# Patient Record
Sex: Male | Born: 1955 | Race: White | Hispanic: No | Marital: Married | Smoking: Former smoker
Health system: Southern US, Community
[De-identification: ages and names within clinical notes are randomized; demographics above are authoritative.]

## PROBLEM LIST (undated history)

## (undated) DIAGNOSIS — Z87442 Personal history of urinary calculi: Secondary | ICD-10-CM

## (undated) DIAGNOSIS — K219 Gastro-esophageal reflux disease without esophagitis: Secondary | ICD-10-CM

## (undated) DIAGNOSIS — E785 Hyperlipidemia, unspecified: Secondary | ICD-10-CM

## (undated) DIAGNOSIS — I10 Essential (primary) hypertension: Secondary | ICD-10-CM

## (undated) DIAGNOSIS — T7840XA Allergy, unspecified, initial encounter: Secondary | ICD-10-CM

## (undated) DIAGNOSIS — M1990 Unspecified osteoarthritis, unspecified site: Secondary | ICD-10-CM

## (undated) HISTORY — DX: Unspecified osteoarthritis, unspecified site: M19.90

## (undated) HISTORY — PX: FOOT SURGERY: SHX648

## (undated) HISTORY — DX: Essential (primary) hypertension: I10

## (undated) HISTORY — DX: Hyperlipidemia, unspecified: E78.5

## (undated) HISTORY — DX: Allergy, unspecified, initial encounter: T78.40XA

## (undated) HISTORY — PX: TOOTH EXTRACTION: SUR596

## (undated) HISTORY — DX: Gastro-esophageal reflux disease without esophagitis: K21.9

## (undated) HISTORY — DX: Personal history of urinary calculi: Z87.442

## (undated) HISTORY — PX: KIDNEY STONE SURGERY: SHX686

---

## 2006-08-20 ENCOUNTER — Emergency Department (HOSPITAL_COMMUNITY): Admission: EM | Admit: 2006-08-20 | Discharge: 2006-08-20 | Payer: Self-pay | Admitting: Emergency Medicine

## 2006-08-29 ENCOUNTER — Ambulatory Visit (HOSPITAL_BASED_OUTPATIENT_CLINIC_OR_DEPARTMENT_OTHER): Admission: RE | Admit: 2006-08-29 | Discharge: 2006-08-29 | Payer: Self-pay | Admitting: Urology

## 2008-10-11 ENCOUNTER — Ambulatory Visit: Payer: Self-pay | Admitting: Internal Medicine

## 2008-10-11 DIAGNOSIS — E785 Hyperlipidemia, unspecified: Secondary | ICD-10-CM | POA: Insufficient documentation

## 2008-10-11 DIAGNOSIS — I4949 Other premature depolarization: Secondary | ICD-10-CM

## 2008-10-11 DIAGNOSIS — Z87442 Personal history of urinary calculi: Secondary | ICD-10-CM | POA: Insufficient documentation

## 2008-10-11 DIAGNOSIS — M2550 Pain in unspecified joint: Secondary | ICD-10-CM | POA: Insufficient documentation

## 2008-10-11 DIAGNOSIS — J309 Allergic rhinitis, unspecified: Secondary | ICD-10-CM | POA: Insufficient documentation

## 2008-10-11 DIAGNOSIS — N401 Enlarged prostate with lower urinary tract symptoms: Secondary | ICD-10-CM

## 2008-10-11 DIAGNOSIS — I10 Essential (primary) hypertension: Secondary | ICD-10-CM

## 2008-10-11 DIAGNOSIS — K219 Gastro-esophageal reflux disease without esophagitis: Secondary | ICD-10-CM

## 2008-10-11 DIAGNOSIS — N138 Other obstructive and reflux uropathy: Secondary | ICD-10-CM

## 2008-10-11 LAB — CONVERTED CEMR LAB
AST: 23 units/L (ref 0–37)
Albumin: 4.4 g/dL (ref 3.5–5.2)
Alkaline Phosphatase: 71 units/L (ref 39–117)
Basophils Absolute: 0.1 10*3/uL (ref 0.0–0.1)
Basophils Relative: 0.7 % (ref 0.0–3.0)
Chloride: 101 meq/L (ref 96–112)
Crystals: NEGATIVE
GFR calc Af Amer: 115 mL/min
GFR calc non Af Amer: 95 mL/min
HDL: 66.4 mg/dL (ref >39.0)
Ketones, ur: NEGATIVE mg/dL
Leukocytes, UA: NEGATIVE
Lymphocytes Relative: 34.6 % (ref 12.0–46.0)
MCHC: 35.1 g/dL (ref 30.0–36.0)
Neutrophils Relative %: 57 % (ref 43.0–77.0)
Nitrite: NEGATIVE
PSA: 0.88 ng/mL (ref 0.10–4.00)
Platelets: 235 10*3/uL (ref 150–400)
Potassium: 3.6 meq/L (ref 3.5–5.1)
RBC: 4.97 M/uL (ref 4.22–5.81)
Sed Rate: 10 mm/hr (ref 0–16)
Sodium: 139 meq/L (ref 135–145)
Specific Gravity, Urine: 1.025 (ref 1.000–1.03)
TSH: 1.24 microintl units/mL (ref 0.35–5.50)
Total Bilirubin: 0.6 mg/dL (ref 0.3–1.2)
Triglycerides: 195 mg/dL — ABNORMAL HIGH (ref 0–149)
Urobilinogen, UA: 0.2 (ref 0.0–1.0)
VLDL: 39 mg/dL (ref 0–40)
pH: 6 (ref 5.0–8.0)

## 2008-10-12 ENCOUNTER — Encounter: Payer: Self-pay | Admitting: Internal Medicine

## 2008-10-25 ENCOUNTER — Ambulatory Visit: Payer: Self-pay | Admitting: Internal Medicine

## 2008-10-25 DIAGNOSIS — D2330 Other benign neoplasm of skin of unspecified part of face: Secondary | ICD-10-CM

## 2008-10-25 LAB — CONVERTED CEMR LAB
Cholesterol, target level: 200 mg/dL
LDL Goal: 160 mg/dL

## 2009-02-07 ENCOUNTER — Ambulatory Visit: Payer: Self-pay | Admitting: Internal Medicine

## 2009-02-08 ENCOUNTER — Encounter: Payer: Self-pay | Admitting: Internal Medicine

## 2009-02-25 ENCOUNTER — Ambulatory Visit: Payer: Self-pay | Admitting: Internal Medicine

## 2009-06-23 ENCOUNTER — Ambulatory Visit: Payer: Self-pay | Admitting: Internal Medicine

## 2009-06-23 DIAGNOSIS — R6882 Decreased libido: Secondary | ICD-10-CM

## 2009-06-23 LAB — CONVERTED CEMR LAB
BUN: 15 mg/dL (ref 6–23)
Basophils Absolute: 0 10*3/uL (ref 0.0–0.1)
Bilirubin, Direct: 0.1 mg/dL (ref 0.0–0.3)
Chloride: 107 meq/L (ref 96–112)
Creatinine, Ser: 0.9 mg/dL (ref 0.4–1.5)
Eosinophils Absolute: 0.1 10*3/uL (ref 0.0–0.7)
Eosinophils Relative: 1.5 % (ref 0.0–5.0)
GFR calc non Af Amer: 94.36 mL/min (ref >60)
Glucose, Bld: 99 mg/dL (ref 70–99)
HCT: 44.2 % (ref 39.0–52.0)
Hemoglobin, Urine: NEGATIVE
Leukocytes, UA: NEGATIVE
Lymphs Abs: 2.2 10*3/uL (ref 0.7–4.0)
MCV: 93 fL (ref 78.0–100.0)
Monocytes Absolute: 0.5 10*3/uL (ref 0.1–1.0)
Neutrophils Relative %: 56.8 % (ref 43.0–77.0)
Nitrite: NEGATIVE
PSA: 0.4 ng/mL (ref 0.10–4.00)
Platelets: 214 10*3/uL (ref 150.0–400.0)
Potassium: 4.2 meq/L (ref 3.5–5.1)
RDW: 13 % (ref 11.5–14.6)
Specific Gravity, Urine: 1.01 (ref 1.000–1.030)
TSH: 0.43 microintl units/mL (ref 0.35–5.50)
Testosterone: 507.62 ng/dL (ref 350.00–890.00)
Total Bilirubin: 0.7 mg/dL (ref 0.3–1.2)
Urine Glucose: NEGATIVE mg/dL
Urobilinogen, UA: 0.2 (ref 0.0–1.0)
WBC: 6.6 10*3/uL (ref 4.5–10.5)

## 2009-06-24 ENCOUNTER — Encounter: Payer: Self-pay | Admitting: Internal Medicine

## 2009-07-25 ENCOUNTER — Ambulatory Visit: Payer: Self-pay | Admitting: Internal Medicine

## 2009-11-23 ENCOUNTER — Ambulatory Visit: Payer: Self-pay | Admitting: Internal Medicine

## 2009-11-23 LAB — CONVERTED CEMR LAB
Cholesterol: 218 mg/dL — ABNORMAL HIGH (ref 0–200)
Total CHOL/HDL Ratio: 3
Triglycerides: 102 mg/dL (ref 0.0–149.0)

## 2009-11-25 ENCOUNTER — Encounter (INDEPENDENT_AMBULATORY_CARE_PROVIDER_SITE_OTHER): Payer: Self-pay | Admitting: *Deleted

## 2009-12-09 ENCOUNTER — Encounter (INDEPENDENT_AMBULATORY_CARE_PROVIDER_SITE_OTHER): Payer: Self-pay | Admitting: *Deleted

## 2009-12-12 ENCOUNTER — Ambulatory Visit: Payer: Self-pay | Admitting: Internal Medicine

## 2010-01-04 ENCOUNTER — Ambulatory Visit: Payer: Self-pay | Admitting: Internal Medicine

## 2010-01-04 LAB — HM COLONOSCOPY

## 2010-03-06 ENCOUNTER — Ambulatory Visit: Payer: Self-pay | Admitting: Internal Medicine

## 2010-04-06 ENCOUNTER — Telehealth: Payer: Self-pay | Admitting: Internal Medicine

## 2010-05-05 ENCOUNTER — Ambulatory Visit: Payer: Self-pay | Admitting: Internal Medicine

## 2010-05-05 DIAGNOSIS — K5900 Constipation, unspecified: Secondary | ICD-10-CM | POA: Insufficient documentation

## 2010-05-05 LAB — CONVERTED CEMR LAB
ALT: 28 units/L (ref 0–53)
AST: 20 units/L (ref 0–37)
Albumin: 4.3 g/dL (ref 3.5–5.2)
Basophils Absolute: 0.1 10*3/uL (ref 0.0–0.1)
Bilirubin Urine: NEGATIVE
Chloride: 93 meq/L — ABNORMAL LOW (ref 96–112)
Eosinophils Relative: 0.3 % (ref 0.0–5.0)
GFR calc non Af Amer: 76.2 mL/min (ref >60)
Glucose, Bld: 141 mg/dL — ABNORMAL HIGH (ref 70–99)
HCT: 42.8 % (ref 39.0–52.0)
Hemoglobin, Urine: NEGATIVE
Hemoglobin: 14.9 g/dL (ref 13.0–17.0)
Ketones, ur: NEGATIVE mg/dL
Lymphs Abs: 1.8 10*3/uL (ref 0.7–4.0)
MCV: 91.6 fL (ref 78.0–100.0)
Monocytes Absolute: 1.2 10*3/uL — ABNORMAL HIGH (ref 0.1–1.0)
Monocytes Relative: 7.1 % (ref 3.0–12.0)
Neutro Abs: 13.3 10*3/uL — ABNORMAL HIGH (ref 1.4–7.7)
PSA: 0.22 ng/mL (ref 0.10–4.00)
Potassium: 4.3 meq/L (ref 3.5–5.1)
RDW: 13.7 % (ref 11.5–14.6)
Sed Rate: 25 mm/hr — ABNORMAL HIGH (ref 0–22)
Sodium: 135 meq/L (ref 135–145)
TSH: 0.41 microintl units/mL (ref 0.35–5.50)
Total Protein, Urine: NEGATIVE mg/dL
Urine Glucose: NEGATIVE mg/dL

## 2010-05-07 ENCOUNTER — Emergency Department (HOSPITAL_COMMUNITY): Admission: EM | Admit: 2010-05-07 | Discharge: 2010-05-07 | Payer: Self-pay | Admitting: Emergency Medicine

## 2010-05-19 ENCOUNTER — Ambulatory Visit: Payer: Self-pay | Admitting: Internal Medicine

## 2010-10-31 NOTE — Miscellaneous (Signed)
Summary: [facility] Previsit/prep  Clinical Lists Changes  Medications: Added new medication of MOVIPREP 100 GM  SOLR (PEG-KCL-NACL-NASULF-NA ASC-C) As per prep instructions. - Signed Rx of MOVIPREP 100 GM  SOLR (PEG-KCL-NACL-NASULF-NA ASC-C) As per prep instructions.;  #1 x 0;  Signed;  Entered by: Wyona Almas RN;  Authorized by: Iva Boop MD, [facility];  Method used: Electronically to [facility] [address] [place], [place], [place]  [address], Ph: [phone], Fax: [phone] Observations: Added new observation of NKA: T (12/12/2009 13:01)    Prescriptions: MOVIPREP 100 GM  SOLR (PEG-KCL-NACL-NASULF-NA ASC-C) As per prep instructions.  #1 x 0   Entered by:   Wyona Almas RN   Authorized by:   Iva Boop MD, [facility]   Signed by:   Wyona Almas RN on 12/12/2009   Method used:   Electronically to        [facility] (retail)       [address]       [place], [place]  [address]       Ph: [phone]       Fax: [phone]   RxID:   [id number]

## 2010-10-31 NOTE — Assessment & Plan Note (Signed)
Summary: FU Steven Hickman  #   Vital Signs:  Patient profile:   55 year old male Height:      67 inches Weight:      197.50 pounds BMI:     31.04 O2 Sat:      96 % on Room air Temp:     98.2 degrees F oral Pulse rate:   82 / minute Pulse rhythm:   regular Resp:     16 per minute BP sitting:   132 / 68  (left arm) Cuff size:   large  Vitals Entered By: Rock Nephew CMA (May 19, 2010 9:45 AM)  Nutrition Counseling: Patient's BMI is greater than 25 and therefore counseled on weight management options.  O2 Flow:  Room air  Primary Care Provider:  Etta Grandchild MD   History of Present Illness: He returns for f/up and says that his stomach feels better now that he is having BM's 2-3 times per day.  Preventive Screening-Counseling & Management  Alcohol-Tobacco     Alcohol drinks/day: 0     Alcohol type: beer     Smoking Status: quit < 6 months     Smoking Cessation Counseling: yes     Year Quit: 05/2009     Pack years: 15     Passive Smoke Exposure: yes     Tobacco Counseling: to remain off tobacco products  Hep-HIV-STD-Contraception     Hepatitis Risk: no risk noted     HIV Risk: no risk noted     STD Risk: no risk noted      Sexual History:  currently monogamous.        Drug Use:  never.        Blood Transfusions:  no.    Medications Prior to Update: 1)  Finasteride 5 Mg Tabs (Finasteride) .... One Once Daily 2)  Omeprazole 40 Mg Cpdr (Omeprazole) .... One By Mouth Once Daily 3)  Amlodipine Besy-Benazepril Hcl 10-20 Mg Caps (Amlodipine Besy-Benazepril Hcl) .... One By Mouth Once Daily 4)  Cardura 2 Mg Tabs (Doxazosin Mesylate) .... One By Mouth Once Daily 5)  Folic Acid 800 Mcg 6)  Fish Oil 1200mg  7)  Garlic 1000 Mg 8)  Vitamin E 400 Iu 9)  Asa 81mg  10)  Complete Vitamin 11)  Nasonex 50 Mcg/act Susp (Mometasone Furoate) .... 2 Puffs Each Nostril Once Daily 12)  Triamcinolone Acetonide 0.5 % Crea (Triamcinolone Acetonide) .... Apply To Aa On Right Hand Two  Times A Day For 10 Days 13)  Miralax  Powd (Polyethylene Glycol 3350) .... One Scoop in 8 Ounces of Juice Once Daily As Needed For Constipation  Current Medications (verified): 1)  Finasteride 5 Mg Tabs (Finasteride) .... One Once Daily 2)  Omeprazole 40 Mg Cpdr (Omeprazole) .... One By Mouth Once Daily 3)  Amlodipine Besy-Benazepril Hcl 10-20 Mg Caps (Amlodipine Besy-Benazepril Hcl) .... One By Mouth Once Daily 4)  Cardura 2 Mg Tabs (Doxazosin Mesylate) .... One By Mouth Once Daily 5)  Folic Acid 800 Mcg 6)  Fish Oil 1200mg  7)  Garlic 1000 Mg 8)  Vitamin E 400 Iu 9)  Asa 81mg  10)  Complete Vitamin 11)  Triamcinolone Acetonide 0.5 % Crea (Triamcinolone Acetonide) .... Apply To Aa On Right Hand Two Times A Day For 10 Days 12)  Miralax  Powd (Polyethylene Glycol 3350) .... One Scoop in 8 Ounces of Juice Once Daily As Needed For Constipation  Allergies (verified): No Known Drug Allergies  Past History:  Past Medical History:  Last updated: 10/11/2008 Allergic rhinitis GERD Hyperlipidemia Hypertension Nephrolithiasis, hx of  Past Surgical History: Last updated: 10/11/2008 Denies surgical history  Family History: Last updated: 10/11/2008 Family History of Arthritis Family History Diabetes 1st degree relative Family History High cholesterol Family History Hypertension  Social History: Last updated: 02/25/2009 Occupation: Barista. Married Alcohol use-yes Drug use-no Regular exercise-no Smokes cigars  Risk Factors: Alcohol Use: 0 (05/19/2010) Exercise: no (10/11/2008)  Risk Factors: Smoking Status: quit < 6 months (05/19/2010) Passive Smoke Exposure: yes (05/19/2010)  Family History: Reviewed history from 10/11/2008 and no changes required. Family History of Arthritis Family History Diabetes 1st degree relative Family History High cholesterol Family History Hypertension  Social History: Reviewed history from 02/25/2009 and no changes  required. Occupation: Barista. Married Alcohol use-yes Drug use-no Regular exercise-no Smokes cigars  Review of Systems       The patient complains of weight gain.  The patient denies anorexia, fever, weight loss, chest pain, peripheral edema, prolonged cough, headaches, hemoptysis, abdominal pain, melena, hematochezia, severe indigestion/heartburn, hematuria, difficulty walking, depression, enlarged lymph nodes, and angioedema.   General:  Denies chills, fatigue, fever, loss of appetite, malaise, sleep disorder, sweats, weakness, and weight loss. GI:  Denies abdominal pain, bloody stools, change in bowel habits, constipation, diarrhea, excessive appetite, gas, indigestion, loss of appetite, nausea, vomiting, vomiting blood, and yellowish skin color. GU:  Denies discharge, dysuria, hematuria, nocturia, urinary frequency, and urinary hesitancy.  Physical Exam  General:  alert, well-developed, well-nourished, well-hydrated, and appropriate dress.   Head:  normocephalic, atraumatic, no abnormalities observed, and no abnormalities palpated.   Mouth:  Oral mucosa and oropharynx without lesions or exudates.  Teeth in good repair. Neck:  supple, full ROM, and no masses.   Lungs:  Normal respiratory effort, chest expands symmetrically. Lungs are clear to auscultation, no crackles or wheezes. Heart:  Normal rate and regular rhythm. S1 and S2 normal without gallop, murmur, click, rub or other extra sounds. Abdomen:  soft, non-tender, normal bowel sounds, no distention, no masses, no guarding, no rigidity, no rebound tenderness, no abdominal hernia, no inguinal hernia, no hepatomegaly, and no splenomegaly.   Msk:  normal ROM, no joint tenderness, no joint swelling, and no joint warmth.   Pulses:  R and L carotid,radial,femoral,dorsalis pedis and posterior tibial pulses are full and equal bilaterally Extremities:  trace left pedal edema and trace right pedal edema.   Neurologic:  No cranial nerve  deficits noted. Station and gait are normal. Plantar reflexes are down-going bilaterally. DTRs are symmetrical throughout. Sensory, motor and coordinative functions appear intact. Skin:  palmar surface of right hand has diffuse, confluent erythema with very sharp edges. there are no blisters or vesicles and there is no breakdown of the skin. distally there is good sensatoin and capillary refill. there is no streaking, induration, exudates. Cervical Nodes:  no anterior cervical adenopathy and no posterior cervical adenopathy.   Axillary Nodes:  no R axillary adenopathy and no L axillary adenopathy.   Psych:  Cognition and judgment appear intact. Alert and cooperative with normal attention span and concentration. No apparent delusions, illusions, hallucinations   Impression & Recommendations:  Problem # 1:  CONSTIPATION (ICD-564.00) Assessment Improved  His updated medication list for this problem includes:    Miralax Powd (Polyethylene glycol 3350) ..... One scoop in 8 ounces of juice once daily as needed for constipation  Problem # 2:  HYPERTENSION (ICD-401.9) Assessment: Improved  His updated medication list for this problem includes:  Amlodipine Besy-benazepril Hcl 10-20 Mg Caps (Amlodipine besy-benazepril hcl) ..... One by mouth once daily    Cardura 2 Mg Tabs (Doxazosin mesylate) ..... One by mouth once daily  BP today: 132/68 Prior BP: 152/80 (05/05/2010)  Prior 10 Yr Risk Heart Disease: Not enough information (02/07/2009)  Labs Reviewed: K+: 4.3 (05/05/2010) Creat: : 1.1 (05/05/2010)   Chol: 218 (11/23/2009)   HDL: 72.30 (11/23/2009)   LDL: DEL (10/11/2008)   TG: 102.0 (11/23/2009)  Problem # 3:  BENIGN PROSTATIC HYPERTROPHY, 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 One once daily    Cardura 2 Mg Tabs (Doxazosin mesylate) ..... One by mouth once  daily  PSA: 0.22 (05/05/2010)     Complete Medication List: 1)  Finasteride 5 Mg Tabs (Finasteride) .... One once daily 2)  Omeprazole 40 Mg Cpdr (Omeprazole) .... One by mouth once daily 3)  Amlodipine Besy-benazepril Hcl 10-20 Mg Caps (Amlodipine besy-benazepril hcl) .... One by mouth once daily 4)  Cardura 2 Mg Tabs (Doxazosin mesylate) .... One by mouth once daily 5)  Folic Acid 800 Mcg  6)  Fish Oil 1200mg   7)  Garlic 1000 Mg  8)  Vitamin E 400 Iu  9)  Asa 81mg   10)  Complete Vitamin  11)  Triamcinolone Acetonide 0.5 % Crea (Triamcinolone acetonide) .... Apply to aa on right hand two times a day for 10 days 12)  Miralax Powd (Polyethylene glycol 3350) .... One scoop in 8 ounces of juice once daily as needed for constipation  Patient Instructions: 1)  Please schedule a follow-up appointment in 2 months. 2)  It is important that you exercise regularly at least 20 minutes 5 times a week. If you develop chest pain, have severe difficulty breathing, or feel very tired , stop exercising immediately and seek medical attention. 3)  You need to lose weight. Consider a lower calorie diet and regular exercise.  4)  Check your Blood Pressure regularly. If it is above 130/80: you should make an appointment.

## 2010-10-31 NOTE — Progress Notes (Signed)
Summary: Refill--Cardura  Phone Note Refill Request Message from:  Fax from Pharmacy on April 06, 2010 8:37 AM  Refills Requested: Medication #1:  CARDURA 2 MG TABS one by mouth once daily Initial call taken by: Lucious Groves,  April 06, 2010 8:37 AM    Prescriptions: CARDURA 2 MG TABS (DOXAZOSIN MESYLATE) one by mouth once daily  #90 x 3   Entered by:   Lucious Groves   Authorized by:   Etta Grandchild MD   Signed by:   Lucious Groves on 04/06/2010   Method used:   Faxed to ...       [facility]       [address]       [place], [place]  [address]       Ph: [phone]       Fax: [phone]   RxID:   [id number]

## 2010-10-31 NOTE — Assessment & Plan Note (Signed)
Summary: LOWER BACK PAIN/STOMACH ACHE-LB   Vital Signs:  Patient profile:   55 year old male Height:      67 inches Weight:      196.25 pounds BMI:     30.85 O2 Sat:      98 % on Room air Temp:     98.6 degrees F oral Pulse rate:   89 / minute Pulse rhythm:   regular Resp:     16 per minute BP sitting:   152 / 80  (left arm) Cuff size:   large  Vitals Entered By: Rock Nephew CMA (May 05, 2010 2:41 PM)  Nutrition Counseling: Patient's BMI is greater than 25 and therefore counseled on weight management options.  O2 Flow:  Room air CC: Patient c/o back, abdominal pain x 05/03/10 Is Patient Diabetic? No Pain Assessment Patient in pain? yes     Location: back, stomach Intensity: 5 Type: aching Onset of pain  Constant   Primary Care [place] Jolin:  Etta Grandchild MD  CC:  Patient c/o back and abdominal pain x 05/03/10.  History of Present Illness:  Abdominal Pain      This is a 55 year old man who presents with Abdominal pain.  The symptoms began 3 days ago.  On a scale of 1 to 10, the intensity is described as a 4.  The patient reports constipation, but denies nausea, vomiting, diarrhea, melena, hematochezia, anorexia, and hematemesis.  The location of the pain is diffuse.  The pain is described as intermittent, dull, and radiating to the back.  The patient denies the following symptoms: fever, weight loss, dysuria, chest pain, jaundice, and dark urine.  The pain is worse with food.    Preventive Screening-Counseling & Management  Alcohol-Tobacco     Alcohol drinks/day: 0     Alcohol type: beer     Smoking Status: quit < 6 months     Smoking Cessation Counseling: yes     Year Quit: 05/2009     Pack years: 15     Passive Smoke Exposure: yes     Tobacco Counseling: to remain off tobacco products  Hep-HIV-STD-Contraception     Hepatitis Risk: no risk noted     HIV Risk: no risk noted     STD Risk: no risk noted      Sexual History:  currently monogamous.     Drug Use:  never.        Blood Transfusions:  no.    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 One Once Daily 2)  Omeprazole 40 Mg Cpdr (Omeprazole) .... One By Mouth Once Daily 3)  Amlodipine Besy-Benazepril Hcl 10-20 Mg Caps (Amlodipine Besy-Benazepril Hcl) .... One By Mouth Once Daily 4)  Cardura 2 Mg Tabs (Doxazosin Mesylate) .... One By Mouth Once Daily 5)  Folic Acid 800  Mcg 6)  Fish Oil 1200mg  7)  Garlic 1000 Mg 8)  Vitamin E 400 Iu 9)  Asa 81mg  10)  Complete Vitamin 11)  Nasonex 50 Mcg/act Susp (Mometasone Furoate) .... 2 Puffs Each Nostril Once Daily 12)  Triamcinolone Acetonide 0.5 % Crea (Triamcinolone Acetonide) .... Apply To Aa On Right Hand Two Times A Day For 10 Days  Current Medications (verified): 1)  Finasteride 5 Mg Tabs (Finasteride) .... One Once Daily 2)  Omeprazole 40 Mg Cpdr (Omeprazole) .... One By Mouth Once Daily 3)  Amlodipine Besy-Benazepril Hcl 10-20 Mg Caps (Amlodipine Besy-Benazepril Hcl) .... One By Mouth Once Daily 4)  Cardura 2 Mg Tabs (Doxazosin Mesylate) .... One By Mouth Once Daily 5)  Folic Acid 800 Mcg 6)  Fish Oil 1200mg  7)  Garlic 1000 Mg 8)  Vitamin E 400 Iu 9)  Asa 81mg  10)  Complete Vitamin 11)  Nasonex 50 Mcg/act Susp (Mometasone  Furoate) .... 2 Puffs Each Nostril Once Daily 12)  Triamcinolone Acetonide 0.5 % Crea (Triamcinolone Acetonide) .... Apply To Aa On Right Hand Two Times A Day For 10 Days  Allergies (verified): No Known Drug Allergies  Past History:  Past Medical History: Last updated: 10/11/2008 Allergic rhinitis GERD Hyperlipidemia Hypertension Nephrolithiasis, hx of  Past Surgical History: Last updated: 10/11/2008 Denies surgical history  Family History: Last updated: 10/11/2008 Family History of Arthritis Family History Diabetes 1st degree relative Family History High cholesterol Family History Hypertension  Social History: Last updated: 02/25/2009 Occupation: Barista. Married Alcohol use-yes Drug use-no Regular exercise-no Smokes cigars  Risk Factors: Alcohol Use: 0 (05/05/2010) Exercise: no (10/11/2008)  Risk Factors: Smoking Status: quit < 6 months (05/05/2010) Passive Smoke Exposure: yes (05/05/2010)  Family History: Reviewed history from 10/11/2008 and no changes required. Family History of Arthritis Family History Diabetes 1st degree relative Family History High cholesterol Family History Hypertension  Social History: Reviewed history from 02/25/2009 and no changes required. Occupation: Barista. Married Alcohol use-yes Drug use-no Regular exercise-no Smokes cigars  Review of Systems       The patient complains of abdominal pain.  The patient denies anorexia, fever, chest pain, syncope, dyspnea on exertion, peripheral edema, prolonged cough, headaches, hemoptysis, melena, hematochezia, severe indigestion/heartburn, hematuria, suspicious skin lesions, difficulty walking, depression, abnormal bleeding, enlarged lymph nodes, angioedema, and testicular masses.   General:  Denies chills, fatigue, fever, loss of appetite, malaise, sleep disorder, sweats, weakness, and weight loss. GI:  Complains of abdominal pain and constipation; denies bloody stools,  diarrhea, gas, hemorrhoids, indigestion, loss of appetite, nausea, vomiting, vomiting blood, and yellowish skin color. GU:  Denies decreased libido, discharge, dysuria, erectile dysfunction, genital sores, hematuria, incontinence, nocturia, urinary frequency, and urinary hesitancy.  Physical Exam  General:  alert, well-developed, well-nourished, well-hydrated, and appropriate dress.   Head:  normocephalic, atraumatic, no abnormalities observed, and no abnormalities palpated.   Eyes:  vision grossly intact, pupils equal, pupils round, and pupils reactive to light.  no icterus. Ears:  R ear normal and L ear normal.   Mouth:  Oral mucosa and oropharynx without lesions or exudates.  Teeth in good repair. Neck:  supple, full ROM, and no masses.   Lungs:  Normal respiratory effort, chest expands symmetrically. Lungs are clear to auscultation, no crackles or wheezes. Heart:  Normal rate and regular rhythm. S1 and S2 normal without gallop, murmur, click, rub or other extra sounds. Abdomen:  soft, non-tender, no distention, no masses, no guarding, no rigidity, no rebound tenderness, no abdominal hernia,  no inguinal hernia, no hepatomegaly, no splenomegaly, and bowel sounds hypoactive.   Rectal:  No external abnormalities noted. Normal sphincter tone. No rectal masses or tenderness. Genitalia:  uncircumcised, no hydrocele, no varicocele, no scrotal masses, no testicular masses or atrophy, no cutaneous lesions, and no urethral discharge.   Prostate:  no nodules, no asymmetry, no induration, and 1+ enlarged.   Msk:  normal ROM, no joint tenderness, no joint swelling, and no joint warmth.   Pulses:  R and L carotid,radial,femoral,dorsalis pedis and posterior tibial pulses are full and equal bilaterally Extremities:  No clubbing, cyanosis, edema, or deformity noted with normal full range of motion of all joints.   Neurologic:  No cranial nerve deficits noted. Station and gait are normal. Plantar reflexes are  down-going bilaterally. DTRs are symmetrical throughout. Sensory, motor and coordinative functions appear intact. Skin:  palmar surface of right hand has diffuse, confluent erythema with very sharp edges. there are no blisters or vesicles and there is no breakdown of the skin. distally there is good sensatoin and capillary refill. there is no streaking, induration, exudates. Cervical Nodes:  no anterior cervical adenopathy and no posterior cervical adenopathy.   Psych:  Cognition and judgment appear intact. Alert and cooperative with normal attention span and concentration. No apparent delusions, illusions, hallucinations   Impression & Recommendations:  Problem # 1:  ABDOMINAL PAIN, EPIGASTRIC (ICD-789.06) Assessment New plain films show a moderate stool burden which I think is causing his pain, will look at labs for other organic causes as well Orders: Venipuncture ([address]) TLB-BMP (Basic Metabolic Panel-BMET) (80048-METABOL) TLB-CBC Platelet - w/Differential (85025-CBCD) TLB-Hepatic/Liver Function Pnl (80076-HEPATIC) TLB-TSH (Thyroid Stimulating Hormone) (84443-TSH) TLB-Amylase (82150-AMYL) TLB-Lipase (83690-LIPASE) TLB-Udip w/ Micro (81001-URINE) TLB-Sedimentation Rate (ESR) (85652-ESR) TLB-PSA (Prostate Specific Antigen) (84153-PSA) T-Abdomen 2-view (74020TC) Hemoccult Guaiac-1 spec.(in office) ([address])  Problem # 2:  CONSTIPATION (ICD-564.00) Assessment: New  His updated medication list for this problem includes:    Miralax Powd (Polyethylene glycol 3350) ..... One scoop in 8 ounces of juice once daily as needed for constipation  Discussed dietary fiber measures and increased water intake.   Orders: Hemoccult Guaiac-1 spec.(in office) ([address])  Problem # 3:  HYPERTENSION (ICD-401.9) Assessment: Improved  His updated medication list for this problem includes:    Amlodipine Besy-benazepril Hcl 10-20 Mg Caps (Amlodipine besy-benazepril hcl) ..... One by mouth once daily     Cardura 2 Mg Tabs (Doxazosin mesylate) ..... One by mouth once daily  Orders: Venipuncture ([address]) TLB-BMP (Basic Metabolic Panel-BMET) (80048-METABOL) TLB-CBC Platelet - w/Differential (85025-CBCD) TLB-Hepatic/Liver Function Pnl (80076-HEPATIC) TLB-TSH (Thyroid Stimulating Hormone) (84443-TSH) TLB-Amylase (82150-AMYL) TLB-Lipase (83690-LIPASE) TLB-Udip w/ Micro (81001-URINE) TLB-Sedimentation Rate (ESR) (85652-ESR) TLB-PSA (Prostate Specific Antigen) (84153-PSA) Hemoccult Guaiac-1 spec.(in office) ([address])  BP today: 152/80 Prior BP: 140/72 (03/06/2010)  Prior 10 Yr Risk Heart Disease: Not enough information (02/07/2009)  Labs Reviewed: K+: 4.2 (06/23/2009) Creat: : 0.9 (06/23/2009)   Chol: 218 (11/23/2009)   HDL: 72.30 (11/23/2009)   LDL: DEL (10/11/2008)   TG: 102.0 (11/23/2009)  Problem # 4:  BENIGN PROSTATIC HYPERTROPHY, 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 One once daily    Cardura 2 Mg Tabs (Doxazosin mesylate) ..... One by mouth once daily  Orders: Venipuncture ([address]) TLB-BMP (Basic Metabolic Panel-BMET) (80048-METABOL) TLB-CBC Platelet - w/Differential (85025-CBCD) TLB-Hepatic/Liver Function Pnl (80076-HEPATIC) TLB-TSH (Thyroid Stimulating Hormone) (84443-TSH) TLB-Amylase (82150-AMYL) TLB-Lipase (83690-LIPASE) TLB-Udip w/ Micro (81001-URINE) TLB-Sedimentation Rate (ESR) (85652-ESR) TLB-PSA (Prostate  Specific Antigen) (84153-PSA) Hemoccult Guaiac-1 spec.(in office) (82270)  Complete Medication List: 1)  Finasteride 5 Mg Tabs (Finasteride) .... One once daily 2)  Omeprazole 40 Mg Cpdr (Omeprazole) .... One by mouth once daily 3)  Amlodipine Besy-benazepril Hcl 10-20 Mg Caps (Amlodipine besy-benazepril hcl) .... One by mouth once daily 4)  Cardura 2 Mg Tabs (Doxazosin mesylate) .... One by mouth once daily 5)  Folic Acid 800 Mcg  6)  Fish  Oil 1200mg   7)  Garlic 1000 Mg  8)  Vitamin E 400 Iu  9)  Asa 81mg   10)  Complete Vitamin  11)  Nasonex 50 Mcg/act Susp (Mometasone furoate) .... 2 puffs each nostril once daily 12)  Triamcinolone Acetonide 0.5 % Crea (Triamcinolone acetonide) .... Apply to aa on right hand two times a day for 10 days 13)  Miralax Powd (Polyethylene glycol 3350) .... One scoop in 8 ounces of juice once daily as needed for constipation  Colorectal Screening:  Current Recommendations:    Hemoccult: NEG X 1 today  PSA Screening:    PSA: 0.40  (06/23/2009)    Reviewed PSA screening recommendations: PSA ordered  Immunization & Chemoprophylaxis:    Tetanus vaccine: Tdap  (11/23/2009)    Influenza vaccine: Historical  (06/15/2009)  Patient Instructions: 1)  Please schedule a follow-up appointment in 2 weeks. Prescriptions: MIRALAX  POWD (POLYETHYLENE GLYCOL 3350) One scoop in 8 ounces of juice once daily as needed for constipation  #30 doses x 11   Entered and Authorized by:   Etta Grandchild MD   Signed by:   Etta Grandchild MD on 05/05/2010   Method used:   Electronically to        [facility] (retail)       [address]       [place], [place]  [address]       Ph: [phone]       Fax: [phone]   RxID:   [id number]

## 2010-10-31 NOTE — Assessment & Plan Note (Signed)
Summary: CPX-LB   Vital Signs:  Patient profile:   55 year old male Height:      67 inches Weight:      193 pounds BMI:     30.34 O2 Sat:      98 % on Room air Temp:     97.7 degrees F oral Pulse rate:   74 / minute Pulse rhythm:   regular Resp:     16 per minute BP sitting:   124 / 72  (left arm) Cuff size:   large  Vitals Entered By: Rock Nephew CMA (November 23, 2009 10:33 AM)  Nutrition Counseling: Patient's BMI is greater than 25 and therefore counseled on weight management options.  O2 Flow:  Room air  Primary Care Provider:  Etta Grandchild MD   History of Present Illness: He returns for a complete physical but also c/o nasal congestion.  Hypertension History:      He denies headache, chest pain, palpitations, dyspnea with exertion, orthopnea, PND, peripheral edema, visual symptoms, neurologic problems, syncope, and side effects from treatment.  He notes no problems with any antihypertensive medication side effects.        Positive major cardiovascular risk factors include male age 53 years old or older, hyperlipidemia, and hypertension.  Negative major cardiovascular risk factors include no history of diabetes, negative family history for ischemic heart disease, and non-tobacco-user status.        Further assessment for target organ damage reveals no history of ASHD, cardiac end-organ damage (CHF/LVH), stroke/TIA, peripheral vascular disease, renal insufficiency, or hypertensive retinopathy.     Preventive Screening-Counseling & Management  Alcohol-Tobacco     Alcohol drinks/day: 0     Alcohol type: beer     Smoking Status: quit < 6 months     Smoking Cessation Counseling: yes     Year Quit: 05/2009     Pack years: 15     Passive Smoke Exposure: yes  Hep-HIV-STD-Contraception     Hepatitis Risk: no risk noted     HIV Risk: no risk noted     STD Risk: no risk noted      Sexual History:  currently monogamous.        Drug Use:  never.        Blood  Transfusions:  no.    Clinical Review Panels:  Prevention   Last PSA:  0.40 (06/23/2009)  Immunizations   Last Tetanus Booster:  Tdap (11/23/2009)   Last Flu Vaccine:  Historical (06/15/2009)  Lipid Management   Cholesterol:  226 (10/11/2008)   LDL (bad choesterol):  DEL (10/11/2008)   HDL (good cholesterol):  66.4 (10/11/2008)  Diabetes Management   HgBA1C:  5.6 (10/11/2008)   Creatinine:  0.9 (06/23/2009)   Last Flu Vaccine:  Historical (06/15/2009)  CBC   WBC:  6.6 (06/23/2009)   RBC:  4.75 (06/23/2009)   Hgb:  15.0 (06/23/2009)   Hct:  44.2 (06/23/2009)   Platelets:  214.0 (06/23/2009)   MCV  93.0 (06/23/2009)   MCHC  34.0 (06/23/2009)   RDW  13.0 (06/23/2009)   PMN:  56.8 (06/23/2009)   Lymphs:  33.3 (06/23/2009)   Monos:  7.9 (06/23/2009)   Eosinophils:  1.5 (06/23/2009)   Basophil:  0.5 (06/23/2009)  Complete Metabolic Panel   Glucose:  99 (06/23/2009)   Sodium:  141 (06/23/2009)   Potassium:  4.2 (06/23/2009)   Chloride:  107 (06/23/2009)   CO2:  27 (06/23/2009)   BUN:  15 (06/23/2009)   Creatinine:  0.9 (06/23/2009)   Albumin:  4.2 (06/23/2009)   Total Protein:  7.8 (06/23/2009)   Calcium:  9.3 (06/23/2009)   Total Bili:  0.7 (06/23/2009)   Alk Phos:  84 (06/23/2009)   SGPT (ALT):  29 (06/23/2009)   SGOT (AST):  21 (06/23/2009)   Current Medications (verified): 1)  Finasteride 5 Mg Tabs (Finasteride) .... One Once Daily 2)  Omeprazole 40 Mg Cpdr (Omeprazole) .... One By Mouth Once Daily 3)  Amlodipine Besy-Benazepril Hcl 10-20 Mg Caps (Amlodipine Besy-Benazepril Hcl) .... One By Mouth Once Daily 4)  Cardura 2 Mg Tabs (Doxazosin Mesylate) .... One By Mouth Once Daily 5)  Folic Acid 800 Mcg 6)  Fish Oil 1200mg  7)  Garlic 1000 Mg 8)  Vitamin E 400 Iu 9)  Asa 81mg  10)  Complete Vitamin  Allergies (verified): No Known Drug Allergies  Past History:  Past Medical History: Reviewed history from 10/11/2008 and no changes required. Allergic  rhinitis GERD Hyperlipidemia Hypertension Nephrolithiasis, hx of  Past Surgical History: Reviewed history from 10/11/2008 and no changes required. Denies surgical history  Family History: Reviewed history from 10/11/2008 and no changes required. Family History of Arthritis Family History Diabetes 1st degree relative Family History High cholesterol Family History Hypertension  Social History: Reviewed history from 02/25/2009 and no changes required. Occupation: Barista. Married Alcohol use-yes Drug use-no Regular exercise-no Smokes cigars Hepatitis Risk:  no risk noted HIV Risk:  no risk noted STD Risk:  no risk noted  Review of Systems ENT:  Complains of nasal congestion and postnasal drainage; denies decreased hearing, difficulty swallowing, ear discharge, earache, hoarseness, nosebleeds, ringing in ears, sinus pressure, and sore throat. GU:  Complains of urinary frequency; denies discharge, dysuria, hematuria, incontinence, nocturia, and urinary hesitancy.  Physical Exam  General:  alert, well-developed, well-nourished, well-hydrated, and appropriate dress.   Head:  normocephalic, atraumatic, no abnormalities observed, and no abnormalities palpated.   Eyes:  vision grossly intact, pupils equal, pupils round, and pupils reactive to light.   Ears:  R ear normal and L ear normal.   Nose:  no external deformity, no airflow obstruction, no intranasal foreign body, no nasal polyps, no nasal mucosal lesions, no mucosal friability, no active bleeding or clots, no sinus percussion tenderness, no septum abnormalities, nasal dischargemucosal pallor, and mucosal edema.   Mouth:  Oral mucosa and oropharynx without lesions or exudates.  Teeth in good repair. Neck:  supple, full ROM, and no masses.   Lungs:  Normal respiratory effort, chest expands symmetrically. Lungs are clear to auscultation, no crackles or wheezes. Heart:  Normal rate and regular rhythm. S1 and S2 normal  without gallop, murmur, click, rub or other extra sounds. Abdomen:  soft, non-tender, normal bowel sounds, no distention, and no masses.   Rectal:  No external abnormalities noted. Normal sphincter tone. No rectal masses or tenderness. heme negative stool. Genitalia:  circumcised, no hydrocele, no varicocele, no scrotal masses, no testicular masses or atrophy, no cutaneous lesions, and no urethral discharge.   Prostate:  no nodules, no asymmetry, no induration, and 1+ enlarged.   Msk:  normal ROM, no joint tenderness, no joint swelling, and no joint warmth.   Pulses:  R and L carotid,radial,femoral,dorsalis pedis and posterior tibial pulses are full and equal bilaterally Extremities:  No clubbing, cyanosis, edema, or deformity noted with normal full range of motion of all joints.   Neurologic:  No cranial nerve deficits noted. Station and gait are normal. Plantar reflexes are down-going bilaterally. DTRs are  symmetrical throughout. Sensory, motor and coordinative functions appear intact. Skin:  turgor normal, color normal, and no rashes.   Cervical Nodes:  no anterior cervical adenopathy and no posterior cervical adenopathy.   Axillary Nodes:  no R axillary adenopathy and no L axillary adenopathy.   Inguinal Nodes:  no R inguinal adenopathy and no L inguinal adenopathy.   Psych:  Cognition and judgment appear intact. Alert and cooperative with normal attention span and concentration. No apparent delusions, illusions, hallucinations Additional Exam:  EKG is unchanged from prior EKG's   Impression & Recommendations:  Problem # 1:  ALLERGIC RHINITIS (ICD-477.9) Assessment Deteriorated  His updated medication list for this problem includes:    Nasonex 50 Mcg/act Susp (Mometasone furoate) .Marland Kitchen... 2 puffs each nostril once daily  Problem # 2:  ROUTINE GENERAL MEDICAL EXAM@HEALTH  CARE FACL (ICD-V70.0) I discussed with the patient the need and technique for monthly testicular self-exam and skin  exams.  I reiterated the need for healthy,safe living with respect to monogamous and protected sex, limited use of alcohol, avoiding drug abuse, no risk taking, and safe driving/seat belt usage.  Orders: Venipuncture (60454) TLB-Lipid Panel (80061-LIPID) Gastroenterology Referral (GI) Hemoccult Guaiac-1 spec.(in office) (82270)  Problem # 3:  ELECTROCARDIOGRAM, ABNORMAL (ICD-794.31) Assessment: Unchanged  Orders: EKG w/ Interpretation (93000)  Problem # 4:  HYPERTENSION (ICD-401.9) Assessment: Improved  His updated medication list for this problem includes:    Amlodipine Besy-benazepril Hcl 10-20 Mg Caps (Amlodipine besy-benazepril hcl) ..... One by mouth once daily    Cardura 2 Mg Tabs (Doxazosin mesylate) ..... One by mouth once daily  BP today: 124/72 Prior BP: 132/74 (07/25/2009)  Prior 10 Yr Risk Heart Disease: Not enough information (02/07/2009)  Labs Reviewed: K+: 4.2 (06/23/2009) Creat: : 0.9 (06/23/2009)   Chol: 226 (10/11/2008)   HDL: 66.4 (10/11/2008)   LDL: DEL (10/11/2008)   TG: 195 (10/11/2008)  Complete Medication List: 1)  Finasteride 5 Mg Tabs (Finasteride) .... One once daily 2)  Omeprazole 40 Mg Cpdr (Omeprazole) .... One by mouth once daily 3)  Amlodipine Besy-benazepril Hcl 10-20 Mg Caps (Amlodipine besy-benazepril hcl) .... One by mouth once daily 4)  Cardura 2 Mg Tabs (Doxazosin mesylate) .... One by mouth once daily 5)  Folic Acid 800 Mcg  6)  Fish Oil 1200mg   7)  Garlic 1000 Mg  8)  Vitamin E 400 Iu  9)  Asa 81mg   10)  Complete Vitamin  11)  Nasonex 50 Mcg/act Susp (Mometasone furoate) .... 2 puffs each nostril once daily  Other Orders: Tdap => 100yrs IM (09811) Admin 1st Vaccine (91478)  Hypertension Assessment/Plan:      The patient's hypertensive risk group is category B: At least one risk factor (excluding diabetes) with no target organ damage.  Today's blood pressure is 124/72.  His blood pressure goal is < 140/90.  Patient  Instructions: 1)  It is important that you exercise regularly at least 20 minutes 5 times a week. If you develop chest pain, have severe difficulty breathing, or feel very tired , stop exercising immediately and seek medical attention. 2)  You need to lose weight. Consider a lower calorie diet and regular exercise.  3)  Check your Blood Pressure regularly. If it is above 140/90: you should make an appointment. Prescriptions: NASONEX 50 MCG/ACT SUSP (MOMETASONE FUROATE) 2 puffs each nostril once daily  #4 inhs x 0   Entered and Authorized by:   Etta Grandchild MD   Signed by:   Etta Grandchild  MD on 11/23/2009   Method used:   Samples Given   RxID:   [id number]    Immunizations Administered:  Tetanus Vaccine:    Vaccine Type: Tdap    Site: left deltoid    Mfr: GlaxoSmithKline    Dose: 0.5 ml    Route: IM    Given by: Rock Nephew CMA    Exp. Date: 11/26/2011    Lot #: [id number]    VIS given: 08/19/07 version given November 23, 2009.

## 2010-10-31 NOTE — Letter (Signed)
Summary: [facility] Instructions  [facility]  [address] [place], [place] [address]   Phone: [phone]  Fax: [phone]       Steven Hickman    May 04, 1956    MRN: [id number]        Procedure Day Dorna Bloom: Wednesday 01/04/10     Arrival Time: 9:00 a.m.     Procedure Time: 10:00 a.m.     Location of Procedure:                    _x_  [facility] (4th Floor)                        PREPARATION FOR COLONOSCOPY WITH MOVIPREP   Starting 5 days prior to your procedure 12/30/09 do not eat nuts, seeds, popcorn, corn, beans, peas,  salads, or any raw vegetables.  Do not take any fiber supplements (e.g. Metamucil, Citrucel, and Benefiber).  THE DAY BEFORE YOUR PROCEDURE         DATE: 01/03/10  DAY: Tuesday  1.  Drink clear liquids the entire day-NO SOLID FOOD  2.  Do not drink anything colored red or purple.  Avoid juices with pulp.  No orange juice.  3.  Drink at least 64 oz. (8 glasses) of fluid/clear liquids during the day to prevent dehydration and help the prep work efficiently.  CLEAR LIQUIDS INCLUDE: Water Jello Ice Popsicles Tea (sugar ok, no milk/cream) Powdered fruit flavored drinks Coffee (sugar ok, no milk/cream) Gatorade Juice: apple, white grape, white cranberry  Lemonade Clear bullion, consomm, broth Carbonated beverages (any kind) Strained chicken noodle soup Hard Candy                             4.  In the morning, mix first dose of MoviPrep solution:    Empty 1 Pouch A and 1 Pouch B into the disposable container    Add lukewarm drinking water to the top line of the container. Mix to dissolve    Refrigerate (mixed solution should be used within 24 hrs)  5.  Begin drinking the prep at 5:00 p.m. The MoviPrep container is divided by 4 marks.   Every 15 minutes drink the solution down to the next mark (approximately 8 oz) until the full liter is complete.   6.  Follow completed prep with 16 oz of clear liquid of your choice (Nothing  red or purple).  Continue to drink clear liquids until bedtime.  7.  Before going to bed, mix second dose of MoviPrep solution:    Empty 1 Pouch A and 1 Pouch B into the disposable container    Add lukewarm drinking water to the top line of the container. Mix to dissolve    Refrigerate  THE DAY OF YOUR PROCEDURE      DATE: 01/04/10  DAY: Wednesday  Beginning at 5:00 a.m. (5 hours before procedure):         1. Every 15 minutes, drink the solution down to the next mark (approx 8 oz) until the full liter is complete.  2. Follow completed prep with 16 oz. of clear liquid of your choice.    3. You may drink clear liquids until 8:00 a.m. (2 HOURS BEFORE PROCEDURE).   MEDICATION INSTRUCTIONS  Unless otherwise instructed, you should take regular prescription medications with a small sip of water   as early as possible the morning of  your procedure.          OTHER INSTRUCTIONS  You will need a responsible adult at least 53 years of age to accompany you and drive you home.   This person must remain in the waiting room during your procedure.  Wear loose fitting clothing that is easily removed.  Leave jewelry and other valuables at home.  However, you may wish to bring a book to read or  an iPod/MP3 player to listen to music as you wait for your procedure to start.  Remove all body piercing jewelry and leave at home.  Total time from sign-in until discharge is approximately 2-3 hours.  You should go home directly after your procedure and rest.  You can resume normal activities the  day after your procedure.  The day of your procedure you should not:   Drive   Make legal decisions   Operate machinery   Drink alcohol   Return to work  You will receive specific instructions about eating, activities and medications before you leave.    The above instructions have been reviewed and explained to me by   _______________________    I fully understand and can verbalize  these instructions _____________________________ Date _________

## 2010-10-31 NOTE — Assessment & Plan Note (Signed)
Summary: BURNING AND BLISTERING ON RIGHT HAND-LB   Vital Signs:  Patient profile:   55 year old male Height:      67 inches Weight:      196 pounds BMI:     30.81 O2 Sat:      95 % on Room air Temp:     97.3 degrees F oral Pulse rate:   89 / minute Pulse rhythm:   regular Resp:     16 per minute BP sitting:   140 / 72  (left arm) Cuff size:   large  Vitals Entered By: Rock Nephew CMA (March 06, 2010 3:08 PM)  Nutrition Counseling: Patient's BMI is greater than 25 and therefore counseled on weight management options.  O2 Flow:  Room air Is Patient Diabetic? No Pain Assessment Patient in pain? no        Primary Care Provider:  Etta Grandchild MD   History of Present Illness: He returns c/o burning and redness on the palm of his right hand for one day after he was at work and thinks a Web designer got underneath the glove. The symptoms peaked last night and are better today.   Preventive Screening-Counseling & Management  Alcohol-Tobacco     Alcohol drinks/day: 0     Alcohol type: beer     Smoking Status: quit < 6 months     Smoking Cessation Counseling: yes     Year Quit: 05/2009     Pack years: 15     Passive Smoke Exposure: yes  Hep-HIV-STD-Contraception     Hepatitis Risk: no risk noted     HIV Risk: no risk noted     STD Risk: no risk noted      Sexual History:  currently monogamous.        Drug Use:  never.        Blood Transfusions:  no.    Medications Prior to Update: 1)  Finasteride 5 Mg Tabs (Finasteride) .... One Once Daily 2)  Omeprazole 40 Mg Cpdr (Omeprazole) .... One By Mouth Once Daily 3)  Amlodipine Besy-Benazepril Hcl 10-20 Mg Caps (Amlodipine Besy-Benazepril Hcl) .... One By Mouth Once Daily 4)  Cardura 2 Mg Tabs (Doxazosin Mesylate) .... One By Mouth Once Daily 5)  Folic Acid 800 Mcg 6)  Fish Oil 1200mg  7)  Garlic 1000 Mg 8)  Vitamin E 400 Iu 9)  Asa 81mg  10)  Complete Vitamin 11)  Nasonex 50 Mcg/act Susp (Mometasone Furoate) .... 2  Puffs Each Nostril Once Daily  Current Medications (verified): 1)  Finasteride 5 Mg Tabs (Finasteride) .... One Once Daily 2)  Omeprazole 40 Mg Cpdr (Omeprazole) .... One By Mouth Once Daily 3)  Amlodipine Besy-Benazepril Hcl 10-20 Mg Caps (Amlodipine Besy-Benazepril Hcl) .... One By Mouth Once Daily 4)  Cardura 2 Mg Tabs (Doxazosin Mesylate) .... One By Mouth Once Daily 5)  Folic Acid 800 Mcg 6)  Fish Oil 1200mg  7)  Garlic 1000 Mg 8)  Vitamin E 400 Iu 9)  Asa 81mg  10)  Complete Vitamin 11)  Nasonex 50 Mcg/act Susp (Mometasone Furoate) .... 2 Puffs Each Nostril Once Daily 12)  Triamcinolone Acetonide 0.5 % Crea (Triamcinolone Acetonide) .... Apply To Aa On Right Hand Two Times A Day For 10 Days  Allergies (verified): No Known Drug Allergies  Past History:  Past Medical History: Last updated: 10/11/2008 Allergic rhinitis GERD Hyperlipidemia Hypertension Nephrolithiasis, hx of  Past Surgical History: Last updated: 10/11/2008 Denies surgical history  Family History: Last updated:  10/11/2008 Family History of Arthritis Family History Diabetes 1st degree relative Family History High cholesterol Family History Hypertension  Social History: Last updated: 02/25/2009 Occupation: Barista. Married Alcohol use-yes Drug use-no Regular exercise-no Smokes cigars  Risk Factors: Alcohol Use: 0 (03/06/2010) Exercise: no (10/11/2008)  Risk Factors: Smoking Status: quit < 6 months (03/06/2010) Passive Smoke Exposure: yes (03/06/2010)  Family History: Reviewed history from 10/11/2008 and no changes required. Family History of Arthritis Family History Diabetes 1st degree relative Family History High cholesterol Family History Hypertension  Social History: Reviewed history from 02/25/2009 and no changes required. Occupation: Barista. Married Alcohol use-yes Drug use-no Regular exercise-no Smokes cigars  Review of Systems  The patient denies anorexia,  fever, weight loss, peripheral edema, prolonged cough, headaches, hemoptysis, abdominal pain, hematuria, and enlarged lymph nodes.   General:  Denies chills, fatigue, fever, malaise, and sweats. Derm:  Complains of changes in color of skin and itching; denies dryness, excessive perspiration, flushing, hair loss, insect bite(s), lesion(s), poor wound healing, and rash.  Physical Exam  General:  alert, well-developed, well-nourished, well-hydrated, and appropriate dress.   Mouth:  Oral mucosa and oropharynx without lesions or exudates.  Teeth in good repair. Neck:  supple, full ROM, and no masses.   Lungs:  Normal respiratory effort, chest expands symmetrically. Lungs are clear to auscultation, no crackles or wheezes. Heart:  Normal rate and regular rhythm. S1 and S2 normal without gallop, murmur, click, rub or other extra sounds. Abdomen:  soft, non-tender, normal bowel sounds, no distention, and no masses.   Skin:  palmar surface of right hand has diffuse, confluent erythema with very sharp edges. there are no blisters or vesicles and there is no breakdown of the skin. distally there is good sensatoin and capillary refill. there is no streaking, induration, exudates. Cervical Nodes:  no anterior cervical adenopathy and no posterior cervical adenopathy.   Psych:  Cognition and judgment appear intact. Alert and cooperative with normal attention span and concentration. No apparent delusions, illusions, hallucinations   Impression & Recommendations:  Problem # 1:  CONTACT DERMATITIS&OTH ECZEMA DUE OTH SPEC AGENT (ICD-692.89) Assessment New  His updated medication list for this problem includes:    Triamcinolone Acetonide 0.5 % Crea (Triamcinolone acetonide) .Marland Kitchen... Apply to aa on right hand two times a day for 10 days  Orders: Admin of Therapeutic Inj  intramuscular or subcutaneous (82956) Depo- Medrol 40mg  (J1030) Depo- Medrol 80mg  (J1040)  Discussed avoidance of triggers and symptomatic  treatment.   Complete Medication List: 1)  Finasteride 5 Mg Tabs (Finasteride) .... One once daily 2)  Omeprazole 40 Mg Cpdr (Omeprazole) .... One by mouth once daily 3)  Amlodipine Besy-benazepril Hcl 10-20 Mg Caps (Amlodipine besy-benazepril hcl) .... One by mouth once daily 4)  Cardura 2 Mg Tabs (Doxazosin mesylate) .... One by mouth once daily 5)  Folic Acid 800 Mcg  6)  Fish Oil 1200mg   7)  Garlic 1000 Mg  8)  Vitamin E 400 Iu  9)  Asa 81mg   10)  Complete Vitamin  11)  Nasonex 50 Mcg/act Susp (Mometasone furoate) .... 2 puffs each nostril once daily 12)  Triamcinolone Acetonide 0.5 % Crea (Triamcinolone acetonide) .... Apply to aa on right hand two times a day for 10 days  Patient Instructions: 1)  Please schedule a follow-up appointment in 2 weeks. Prescriptions: TRIAMCINOLONE ACETONIDE 0.5 % CREA (TRIAMCINOLONE ACETONIDE) Apply to AA on right hand two times a day for 10 days  #30 gms x 1  Entered and Authorized by:   Etta Grandchild MD   Signed by:   Etta Grandchild MD on 03/06/2010   Method used:   Electronically to        [facility] (retail)       [address]       [place], [place]  [address]       Ph: [phone]       Fax: [phone]   RxID:   [id number]    Medication Administration  Injection # 1:    Medication: Depo- Medrol 80mg     Diagnosis: CONTACT DERMATITIS&OTH ECZEMA DUE OTH SPEC AGENT (ICD-692.89)    Route: IM    Site: R deltoid    Exp Date: 10/2012    Lot #: [id number]    Mfr: [employer]    Given by: Ami Bullins CMA (March 06, 2010 3:27 PM)  Injection # 2:    Medication: Depo- Medrol 40mg     Diagnosis: CONTACT DERMATITIS&OTH ECZEMA DUE OTH SPEC AGENT (ICD-692.89)    Route: IM    Site: R deltoid    Exp Date: 10/2012    Lot #: [id number]    Mfr: [employer]    Given by: Ami Bullins CMA (March 06, 2010 3:27 PM)  Orders Added: 1)  Admin of Therapeutic Inj  intramuscular or subcutaneous [96372] 2)  Depo- Medrol 40mg   [J1030] 3)  Depo- Medrol 80mg  [J1040] 4)  Est. Patient Level IV [40102]

## 2010-10-31 NOTE — Letter (Signed)
Summary: Previsit letter  [facility]  [address] [place], [place] [address]   Phone: [phone]  Fax: [phone]       11/25/2009 MRN: [id number]  Steven Hickman [address] [place], [place]  [address]  Dear Mr. Uzelac,  Welcome to the Gastroenterology Division at [facility].    You are scheduled to see a nurse for your pre-procedure visit on 12-12-09 at 1:30p.m. on the 3rd floor at [facility], [address]  We ask that you try to arrive at our office 15 minutes prior to your appointment time to allow for check-in.  Your nurse visit will consist of discussing your medical and surgical history, your immediate family medical history, and your medications.    Please bring a complete list of all your medications or, if you prefer, bring the medication bottles and we will list them.  We will need to be aware of both prescribed and over the counter drugs.  We will need to know exact dosage information as well.  If you are on blood thinners (Coumadin, Plavix, Aggrenox, Ticlid, etc.) please call our office today/prior to your appointment, as we need to consult with your physician about holding your medication.   Please be prepared to read and sign documents such as consent forms, a financial agreement, and acknowledgement forms.  If necessary, and with your consent, a friend or relative is welcome to sit-in on the nurse visit with you.  Please bring your insurance card so that we may make a copy of it.  If your insurance requires a referral to see a specialist, please bring your referral form from your primary care physician.  No co-pay is required for this nurse visit.     If you cannot keep your appointment, please call [phone] to cancel or reschedule prior to your appointment date.  This allows [place] the opportunity to schedule an appointment for another patient in need of care.    Thank you for choosing [facility] for your medical needs.  We  appreciate the opportunity to care for you.  Please visit [place] at our website  to learn more about our practice.                     Sincerely.                                                                                                                   The Gastroenterology Division

## 2010-10-31 NOTE — Letter (Signed)
Summary: Lipid Letter  [facility]  [address] [place], [place] [address]   Phone: [phone]  Fax: [phone]    11/23/2009  Cylis Ayars [address] [place], [place]  [address]  Dear Gerilyn Pilgrim:  We have carefully reviewed your last lipid profile from 10/11/2008 and the results are noted below with a summary of recommendations for lipid management.    Cholesterol:       218     Goal: <200   HDL "good" Cholesterol:   57.84     Goal: >40   LDL "bad" Cholesterol:   140     Goal: <160   Triglycerides:       102.0     Goal: <150    Your lipid goals have  been met; we recommend improving on your TLC diet     TLC Diet (Therapeutic Lifestyle Change): Saturated Fats & Transfatty acids should be kept < 7% of total calories ***Reduce Saturated Fats Polyunstaurated Fat can be up to 10% of total calories Monounsaturated Fat Fat can be up to 20% of total calories Total Fat should be no greater than 25-35% of total calories Carbohydrates should be 50-60% of total calories Protein should be approximately 15% of total calories Fiber should be at least 20-30 grams a day ***Increased fiber may help lower LDL Total Cholesterol should be < 200mg /day Consider adding plant stanol/sterols to diet (example: Benacol spread) ***A higher intake of unsaturated fat may reduce Triglycerides and Increase HDL    Adjunctive Measures (may lower LIPIDS and reduce risk of Heart Attack) include: Aerobic Exercise (20-30 minutes 3-4 times a week) Limit Alcohol Consumption Weight Reduction Aspirin 75-81 mg a day by mouth (if not allergic or contraindicated) Dietary Fiber 20-30 grams a day by mouth     Current Medications: 1)    Finasteride 5 Mg Tabs (Finasteride) .... One once daily 2)    Omeprazole 40 Mg Cpdr (Omeprazole) .... One by mouth once daily 3)    Amlodipine Besy-benazepril Hcl 10-20 Mg Caps (Amlodipine besy-benazepril hcl) .... One by mouth once daily 4)    Cardura 2 Mg Tabs  (Doxazosin mesylate) .... One by mouth once daily 5)    Folic Acid 800 Mcg  6)    Fish Oil 1200mg   7)    Garlic 1000 Mg  8)    Vitamin E 400 Iu  9)    Asa 81mg   10)    Complete Vitamin  11)    Nasonex 50 Mcg/act Susp (Mometasone furoate) .... 2 puffs each nostril once daily  If you have any questions, please call. We appreciate being able to work with you.   Sincerely,    [facility] Etta Grandchild MD

## 2010-10-31 NOTE — Procedures (Signed)
Summary: Colonoscopy  Patient: Quadir Muns Note: All result statuses are Final unless otherwise noted.  Tests: (1) Colonoscopy (COL)   COL Colonoscopy           DONE     [facility]     [address]     [place], [place]  [address]           COLONOSCOPY PROCEDURE REPORT           PATIENT:  Frank, Novelo  MR#:  [id number]     BIRTHDATE:  1955-11-22, 54 yrs. old  GENDER:  male     ENDOSCOPIST:  Iva Boop, MD, [facility]     REF. BY:  Etta Grandchild, M.D.     PROCEDURE DATE:  01/04/2010     PROCEDURE:  Colonoscopy 19147     ASA CLASS:  Class II     INDICATIONS:  Routine Risk Screening     MEDICATIONS:   Fentanyl 50 mcg IV, Versed 6 mg IV           DESCRIPTION OF PROCEDURE:   After the risks benefits and     alternatives of the procedure were thoroughly explained, informed     consent was obtained.  Digital rectal exam was performed and     revealed an enlarged prostate and no rectal masses.  Mildly     enlarged and smooth prostate. The LB [id number] endoscope     was introduced through the anus and advanced to the cecum, which     was identified by both the appendix and ileocecal valve, without     limitations.  The quality of the prep was excellent, using     MoviPrep.  The instrument was then slowly withdrawn as the colon     was fully examined. Insertion: 1:20 minutes Wuithdrawal: 7:55     minutes     <<PROCEDUREIMAGES>>           FINDINGS:  A normal appearing cecum, ileocecal valve, and     appendiceal orifice were identified. The ascending, hepatic     flexure, transverse, splenic flexure, descending, sigmoid colon,     and rectum appeared unremarkable.   Retroflexed views in the     rectum revealed no abnormalities.    The scope was then withdrawn     from the patient and the procedure completed.           COMPLICATIONS:  None     ENDOSCOPIC IMPRESSION:     1) Normal colonoscopy, excellent prep           REPEAT EXAM:  In 10 year(s) for routine  screening colonocsopy.           Iva Boop, MD, [facility]           CC:  Etta Grandchild, MD     The Patient           n.     Rosalie Doctor:   Iva Boop at 01/04/2010 10:18 AM           Othella Boyer, [phone]  Note: An exclamation mark (!) indicates a result that was not dispersed into the flowsheet. Document Creation Date: 01/04/2010 10:19 AM _______________________________________________________________________  (1) Order result status: Final Collection or observation date-time: 01/04/2010 10:06 Requested date-time:  Receipt date-time:  Reported date-time:  Referring Physician:   Ordering Physician: Stan Head [phone]) Specimen Source:  Source: Launa Grill Order Number: [id number] Lab site:   Appended  Document: Colonoscopy    Clinical Lists Changes  Observations: Added new observation of COLONNXTDUE: 12/2019 (01/04/2010 14:47)

## 2010-11-28 ENCOUNTER — Ambulatory Visit (INDEPENDENT_AMBULATORY_CARE_PROVIDER_SITE_OTHER): Payer: Managed Care, Other (non HMO) | Admitting: Internal Medicine

## 2010-11-28 ENCOUNTER — Other Ambulatory Visit: Payer: Managed Care, Other (non HMO)

## 2010-11-28 ENCOUNTER — Encounter: Payer: Self-pay | Admitting: Internal Medicine

## 2010-11-28 ENCOUNTER — Other Ambulatory Visit: Payer: Self-pay | Admitting: Internal Medicine

## 2010-11-28 DIAGNOSIS — N401 Enlarged prostate with lower urinary tract symptoms: Secondary | ICD-10-CM

## 2010-11-28 DIAGNOSIS — E785 Hyperlipidemia, unspecified: Secondary | ICD-10-CM

## 2010-11-28 DIAGNOSIS — I10 Essential (primary) hypertension: Secondary | ICD-10-CM

## 2010-11-28 DIAGNOSIS — K219 Gastro-esophageal reflux disease without esophagitis: Secondary | ICD-10-CM

## 2010-11-28 DIAGNOSIS — G4730 Sleep apnea, unspecified: Secondary | ICD-10-CM

## 2010-11-28 DIAGNOSIS — Z0000 Encounter for general adult medical examination without abnormal findings: Secondary | ICD-10-CM

## 2010-11-28 DIAGNOSIS — N400 Enlarged prostate without lower urinary tract symptoms: Secondary | ICD-10-CM

## 2010-11-28 LAB — HEPATIC FUNCTION PANEL
Albumin: 4.1 g/dL (ref 3.5–5.2)
Alkaline Phosphatase: 78 U/L (ref 39–117)

## 2010-11-28 LAB — BASIC METABOLIC PANEL
CO2: 29 mEq/L (ref 19–32)
Calcium: 9 mg/dL (ref 8.4–10.5)
Creatinine, Ser: 0.9 mg/dL (ref 0.4–1.5)
Glucose, Bld: 97 mg/dL (ref 70–99)
Sodium: 137 mEq/L (ref 135–145)

## 2010-11-28 LAB — URINALYSIS, ROUTINE W REFLEX MICROSCOPIC
Hgb urine dipstick: NEGATIVE
Leukocytes, UA: NEGATIVE
Nitrite: NEGATIVE
Total Protein, Urine: NEGATIVE
Urobilinogen, UA: 0.2 (ref 0.0–1.0)

## 2010-11-28 LAB — CBC WITH DIFFERENTIAL/PLATELET
Basophils Absolute: 0 10*3/uL (ref 0.0–0.1)
Eosinophils Absolute: 0.1 10*3/uL (ref 0.0–0.7)
Hemoglobin: 14.5 g/dL (ref 13.0–17.0)
Lymphocytes Relative: 33.8 % (ref 12.0–46.0)
MCHC: 33.7 g/dL (ref 30.0–36.0)
Neutro Abs: 3.7 10*3/uL (ref 1.4–7.7)
RDW: 13.7 % (ref 11.5–14.6)

## 2010-11-28 LAB — PSA: PSA: 0.14 ng/mL (ref 0.10–4.00)

## 2010-11-28 LAB — TSH: TSH: 0.68 u[IU]/mL (ref 0.35–5.50)

## 2010-11-28 LAB — LIPID PANEL
Cholesterol: 191 mg/dL (ref 0–200)
HDL: 59.3 mg/dL (ref >39.00)
Triglycerides: 55 mg/dL (ref 0.0–149.0)

## 2010-12-07 NOTE — Letter (Signed)
Summary: Lipid Letter  [facility]  [address] [place], [place] [address]   Phone: [phone]  Fax: [phone]    11/28/2010  Steven Hickman [address] [place], [place]  [address]  Dear Steven Hickman:  We have carefully reviewed your last lipid profile from 11/28/2010 and the results are noted below with a summary of recommendations for lipid management.    Cholesterol:       191     Goal: <200   HDL "good" Cholesterol:   64.33     Goal: >40   LDL "bad" Cholesterol:   121     Goal: <160   Triglycerides:       55.0     Goal: <150    Your lipid goals have not been met; we recommend improving on your TLC diet and Adjunctive Measures (see below) and make the following changes to your regimen:    TLC Diet (Therapeutic Lifestyle Change): Saturated Fats & Transfatty acids should be kept < 7% of total calories ***Reduce Saturated Fats Polyunstaurated Fat can be up to 10% of total calories Monounsaturated Fat Fat can be up to 20% of total calories Total Fat should be no greater than 25-35% of total calories Carbohydrates should be 50-60% of total calories Protein should be approximately 15% of total calories Fiber should be at least 20-30 grams a day ***Increased fiber may help lower LDL Total Cholesterol should be < 200mg /day Consider adding plant stanol/sterols to diet (example: Benacol spread) ***A higher intake of unsaturated fat may reduce Triglycerides and Increase HDL    Adjunctive Measures (may lower LIPIDS and reduce risk of Heart Attack) include: Aerobic Exercise (20-30 minutes 3-4 times a week) Limit Alcohol Consumption Weight Reduction Aspirin 75-81 mg a day by mouth (if not allergic or contraindicated) Dietary Fiber 20-30 grams a day by mouth     Current Medications: 1)    Finasteride 5 Mg Tabs (Finasteride) .... One once daily 2)    Omeprazole 40 Mg Cpdr (Omeprazole) .... One by mouth once daily 3)    Amlodipine Besy-benazepril Hcl 10-20 Mg Caps  (Amlodipine besy-benazepril hcl) .... One by mouth once daily 4)    Cardura 2 Mg Tabs (Doxazosin mesylate) .... One by mouth once daily 5)    Folic Acid 800 Mcg  6)    Fish Oil 1200mg   7)    Garlic 1000 Mg  8)    Vitamin E 400 Iu  9)    Asa 81mg   10)    Complete Vitamin   If you have any questions, please call. We appreciate being able to work with you.   Sincerely,     [facility] Etta Grandchild MD

## 2010-12-07 NOTE — Assessment & Plan Note (Signed)
Summary: cpx--will come fasting/labs after   Vital Signs:  Patient profile:   55 year old male Height:      67 inches Weight:      207 pounds BMI:     32.54 O2 Sat:      95 % on Room air Temp:     98.3 degrees F oral Pulse rate:   80 / minute Pulse rhythm:   regular Resp:     16 per minute BP sitting:   136 / 70  (left arm) Cuff size:   large  Vitals Entered By: Rock Nephew CMA (November 28, 2010 8:51 AM)  Nutrition Counseling: Patient's BMI is greater than 25 and therefore counseled on weight management options.  O2 Flow:  Room air  Primary Care Provider:  Etta Grandchild MD   History of Present Illness: He comes in for a complete physical but his wife tells me that he has heavy snoring and apnea.  Hypertension History:      He denies headache, chest pain, palpitations, dyspnea with exertion, orthopnea, PND, peripheral edema, visual symptoms, neurologic problems, syncope, and side effects from treatment.  He notes no problems with any antihypertensive medication side effects.        Positive major cardiovascular risk factors include male age 32 years old or older, hyperlipidemia, and hypertension.  Negative major cardiovascular risk factors include no history of diabetes, negative family history for ischemic heart disease, and non-tobacco-user status.        Further assessment for target organ damage reveals no history of ASHD, cardiac end-organ damage (CHF/LVH), stroke/TIA, peripheral vascular disease, renal insufficiency, or hypertensive retinopathy.     Preventive Screening-Counseling & Management  Alcohol-Tobacco     Alcohol drinks/day: 0     Alcohol type: beer     >5/day in last 3 mos: no     Alcohol Counseling: not indicated; use of alcohol is not excessive or problematic     Feels need to cut down: no     Feels annoyed by complaints: no     Feels guilty re: drinking: no     Needs 'eye opener' in am: no     Smoking Status: quit < 6 months     Smoking Cessation  Counseling: yes     Year Quit: 05/2009     Pack years: 15     Passive Smoke Exposure: yes     Tobacco Counseling: to remain off tobacco products  Hep-HIV-STD-Contraception     Hepatitis Risk: no risk noted     HIV Risk: no risk noted     STD Risk: no risk noted      Sexual History:  currently monogamous.        Drug Use:  never.        Blood Transfusions:  no.    Clinical Review Panels:  Prevention   Last Colonoscopy:  DONE (01/04/2010)   Last PSA:  0.22 (05/05/2010)  Immunizations   Last Tetanus Booster:  Tdap (11/23/2009)   Last Flu Vaccine:  Historical (07/07/2010)  Lipid Management   Cholesterol:  218 (11/23/2009)   LDL (bad choesterol):  DEL (10/11/2008)   HDL (good cholesterol):  72.30 (11/23/2009)  Diabetes Management   HgBA1C:  5.6 (10/11/2008)   Creatinine:  1.1 (05/05/2010)   Last Flu Vaccine:  Historical (07/07/2010)  CBC   WBC:  16.4 (05/05/2010)   RBC:  4.67 (05/05/2010)   Hgb:  14.9 (05/05/2010)   Hct:  42.8 (05/05/2010)   Platelets:  224.0 (05/05/2010)   MCV  91.6 (05/05/2010)   MCHC  34.9 (05/05/2010)   RDW  13.7 (05/05/2010)   PMN:  81.3 (05/05/2010)   Lymphs:  11.0 (05/05/2010)   Monos:  7.1 (05/05/2010)   Eosinophils:  0.3 (05/05/2010)   Basophil:  0.3 (05/05/2010)  Complete Metabolic Panel   Glucose:  141 (05/05/2010)   Sodium:  135 (05/05/2010)   Potassium:  4.3 (05/05/2010)   Chloride:  93 (05/05/2010)   CO2:  31 (05/05/2010)   BUN:  13 (05/05/2010)   Creatinine:  1.1 (05/05/2010)   Albumin:  4.3 (05/05/2010)   Total Protein:  7.9 (05/05/2010)   Calcium:  9.4 (05/05/2010)   Total Bili:  0.7 (05/05/2010)   Alk Phos:  80 (05/05/2010)   SGPT (ALT):  28 (05/05/2010)   SGOT (AST):  20 (05/05/2010)   Medications Prior to Update: 1)  Finasteride 5 Mg Tabs (Finasteride) .... One Once Daily 2)  Omeprazole 40 Mg Cpdr (Omeprazole) .... One By Mouth Once Daily 3)  Amlodipine Besy-Benazepril Hcl 10-20 Mg Caps (Amlodipine Besy-Benazepril  Hcl) .... One By Mouth Once Daily 4)  Cardura 2 Mg Tabs (Doxazosin Mesylate) .... One By Mouth Once Daily 5)  Folic Acid 800 Mcg 6)  Fish Oil 1200mg  7)  Garlic 1000 Mg 8)  Vitamin E 400 Iu 9)  Asa 81mg  10)  Complete Vitamin 11)  Triamcinolone Acetonide 0.5 % Crea (Triamcinolone Acetonide) .... Apply To Aa On Right Hand Two Times A Day For 10 Days 12)  Miralax  Powd (Polyethylene Glycol 3350) .... One Scoop in 8 Ounces of Juice Once Daily As Needed For Constipation  Current Medications (verified): 1)  Finasteride 5 Mg Tabs (Finasteride) .... One Once Daily 2)  Omeprazole 40 Mg Cpdr (Omeprazole) .... One By Mouth Once Daily 3)  Amlodipine Besy-Benazepril Hcl 10-20 Mg Caps (Amlodipine Besy-Benazepril Hcl) .... One By Mouth Once Daily 4)  Cardura 2 Mg Tabs (Doxazosin Mesylate) .... One By Mouth Once Daily 5)  Folic Acid 800 Mcg 6)  Fish Oil 1200mg  7)  Garlic 1000 Mg 8)  Vitamin E 400 Iu 9)  Asa 81mg  10)  Complete Vitamin  Allergies (verified): No Known Drug Allergies  Past History:  Past Medical History: Last updated: 10/11/2008 Allergic rhinitis GERD Hyperlipidemia Hypertension Nephrolithiasis, hx of  Past Surgical History: Last updated: 10/11/2008 Denies surgical history  Family History: Last updated: 10/11/2008 Family History of Arthritis Family History Diabetes 1st degree relative Family History High cholesterol Family History Hypertension  Social History: Last updated: 02/25/2009 Occupation: Barista. Married Alcohol use-yes Drug use-no Regular exercise-no Smokes cigars  Risk Factors: Alcohol Use: 0 (11/28/2010) >5 drinks/d w/in last 3 months: no (11/28/2010) Exercise: no (10/11/2008)  Risk Factors: Smoking Status: quit < 6 months (11/28/2010) Passive Smoke Exposure: yes (11/28/2010)  Family History: Reviewed history from 10/11/2008 and no changes required. Family History of Arthritis Family History Diabetes 1st degree relative Family History  High cholesterol Family History Hypertension  Social History: Reviewed history from 02/25/2009 and no changes required. Occupation: Barista. Married Alcohol use-yes Drug use-no Regular exercise-no Smokes cigars  Review of Systems       The patient complains of weight gain.  The patient denies anorexia, fever, weight loss, chest pain, syncope, dyspnea on exertion, peripheral edema, prolonged cough, headaches, hemoptysis, abdominal pain, melena, hematochezia, severe indigestion/heartburn, hematuria, suspicious skin lesions, transient blindness, difficulty walking, depression, unusual weight change, abnormal bleeding, enlarged lymph nodes, angioedema, and testicular masses.   Resp:  Complains  of excessive snoring, hypersomnolence, and morning headaches; denies chest discomfort, chest pain with inspiration, cough, coughing up blood, pleuritic, shortness of breath, sputum productive, and wheezing. GU:  Complains of decreased libido and erectile dysfunction; denies discharge, dysuria, genital sores, hematuria, incontinence, nocturia, urinary frequency, and urinary hesitancy.  Physical Exam  General:  alert, well-developed, well-nourished, well-hydrated, appropriate dress, healthy-appearing, cooperative to examination, good hygiene, and overweight-appearing.   Head:  normocephalic, atraumatic, no abnormalities observed, no abnormalities palpated, and no alopecia.   Eyes:  vision grossly intact, pupils equal, pupils round, and pupils reactive to light.   Ears:  R ear normal and L ear normal.   Nose:  External nasal examination shows no deformity or inflammation. Nasal mucosa are pink and moist without lesions or exudates. Mouth:  Oral mucosa and oropharynx without lesions or exudates.  Teeth in good repair. Neck:  supple, full ROM, and no masses.   Lungs:  Normal respiratory effort, chest expands symmetrically. Lungs are clear to auscultation, no crackles or wheezes. Heart:  Normal rate and  regular rhythm. S1 and S2 normal without gallop, murmur, click, rub or other extra sounds. Abdomen:  soft, non-tender, normal bowel sounds, no distention, no masses, no guarding, no rigidity, no rebound tenderness, no abdominal hernia, no inguinal hernia, no hepatomegaly, and no splenomegaly.   Rectal:  No external abnormalities noted. Normal sphincter tone. No rectal masses or tenderness. Genitalia:  uncircumcised, no hydrocele, no varicocele, no scrotal masses, no testicular masses or atrophy, no cutaneous lesions, and no urethral discharge.   Prostate:  no nodules, no asymmetry, no induration, and 1+ enlarged.   Msk:  No deformity or scoliosis noted of thoracic or lumbar spine.   Pulses:  R and L carotid,radial,femoral,dorsalis pedis and posterior tibial pulses are full and equal bilaterally Extremities:  No clubbing, cyanosis, edema, or deformity noted with normal full range of motion of all joints.   Neurologic:  No cranial nerve deficits noted. Station and gait are normal. Plantar reflexes are down-going bilaterally. DTRs are symmetrical throughout. Sensory, motor and coordinative functions appear intact. Skin:  turgor normal, color normal, no rashes, no suspicious lesions, no ecchymoses, no petechiae, no purpura, no ulcerations, and no edema.   Cervical Nodes:  no anterior cervical adenopathy and no posterior cervical adenopathy.   Axillary Nodes:  no R axillary adenopathy and no L axillary adenopathy.   Inguinal Nodes:  no R inguinal adenopathy and no L inguinal adenopathy.   Psych:  Cognition and judgment appear intact. Alert and cooperative with normal attention span and concentration. No apparent delusions, illusions, hallucinations   Impression & Recommendations:  Problem # 1:  SLEEP APNEA (ICD-780.57) Assessment New  Orders: Sleep Disorder Referral (Sleep Disorder) Venipuncture (16109) TLB-Lipid Panel (80061-LIPID) TLB-BMP (Basic Metabolic Panel-BMET) (80048-METABOL) TLB-CBC  Platelet - w/Differential (85025-CBCD) TLB-Hepatic/Liver Function Pnl (80076-HEPATIC) TLB-TSH (Thyroid Stimulating Hormone) (84443-TSH) TLB-PSA (Prostate Specific Antigen) (84153-PSA) TLB-Udip w/ Micro (81001-URINE)  Problem # 2:  HYPERTENSION (ICD-401.9) Assessment: Improved  His updated medication list for this problem includes:    Amlodipine Besy-benazepril Hcl 10-20 Mg Caps (Amlodipine besy-benazepril hcl) ..... One by mouth once daily    Cardura 2 Mg Tabs (Doxazosin mesylate) ..... One by mouth once daily  Orders: Venipuncture (60454) TLB-Lipid Panel (80061-LIPID) TLB-BMP (Basic Metabolic Panel-BMET) (80048-METABOL) TLB-CBC Platelet - w/Differential (85025-CBCD) TLB-Hepatic/Liver Function Pnl (80076-HEPATIC) TLB-TSH (Thyroid Stimulating Hormone) (84443-TSH) TLB-PSA (Prostate Specific Antigen) (84153-PSA) TLB-Udip w/ Micro (81001-URINE)  BP today: 136/70 Prior BP: 132/68 (05/19/2010)  Prior 10 Yr Risk Heart Disease: Not  enough information (02/07/2009)  Labs Reviewed: K+: 4.3 (05/05/2010) Creat: : 1.1 (05/05/2010)   Chol: 218 (11/23/2009)   HDL: 72.30 (11/23/2009)   LDL: DEL (10/11/2008)   TG: 102.0 (11/23/2009)  Problem # 3:  BENIGN PROSTATIC HYPERTROPHY, WITH URINARY OBSTRUCTION (ICD-600.01) Assessment: Improved  His updated medication list for this problem includes:    Finasteride 5 Mg Tabs (Finasteride) ..... One once daily    Cardura 2 Mg Tabs (Doxazosin mesylate) ..... One by mouth once daily  Orders: Venipuncture (13244) TLB-Lipid Panel (80061-LIPID) TLB-BMP (Basic Metabolic Panel-BMET) (80048-METABOL) TLB-CBC Platelet - w/Differential (85025-CBCD) TLB-Hepatic/Liver Function Pnl (80076-HEPATIC) TLB-TSH (Thyroid Stimulating Hormone) (84443-TSH) TLB-PSA (Prostate Specific Antigen) (84153-PSA) TLB-Udip w/ Micro (81001-URINE)  PSA: 0.22 (05/05/2010)     Problem # 4:  HYPERLIPIDEMIA (ICD-272.4) Assessment: Unchanged  Orders: Venipuncture (01027) TLB-Lipid  Panel (80061-LIPID) TLB-BMP (Basic Metabolic Panel-BMET) (80048-METABOL) TLB-CBC Platelet - w/Differential (85025-CBCD) TLB-Hepatic/Liver Function Pnl (80076-HEPATIC) TLB-TSH (Thyroid Stimulating Hormone) (84443-TSH) TLB-PSA (Prostate Specific Antigen) (84153-PSA) TLB-Udip w/ Micro (81001-URINE)  Problem # 5:  ROUTINE GENERAL MEDICAL EXAM@HEALTH  CARE FACL (ICD-V70.0) Assessment: Unchanged  Orders: Venipuncture (25366) TLB-Lipid Panel (80061-LIPID) TLB-BMP (Basic Metabolic Panel-BMET) (80048-METABOL) TLB-CBC Platelet - w/Differential (85025-CBCD) TLB-Hepatic/Liver Function Pnl (80076-HEPATIC) TLB-TSH (Thyroid Stimulating Hormone) (84443-TSH) TLB-PSA (Prostate Specific Antigen) (84153-PSA) TLB-Udip w/ Micro (81001-URINE) Hemoccult Guaiac-1 spec.(in office) (44034)  Colonoscopy: DONE (01/04/2010) Td Booster: Tdap (11/23/2009)   Flu Vax: Historical (07/07/2010)   Chol: 218 (11/23/2009)   HDL: 72.30 (11/23/2009)   LDL: DEL (10/11/2008)   TG: 102.0 (11/23/2009) TSH: 0.41 (05/05/2010)   HgbA1C: 5.6 (10/11/2008)   PSA: 0.22 (05/05/2010) Next Colonoscopy due:: 12/2019 (01/04/2010)  Discussed using sunscreen, use of alcohol, drug use, self testicular exam, routine dental care, routine eye care, routine physical exam, seat belts, multiple vitamins, osteoporosis prevention, adequate calcium intake in diet, and recommendations for immunizations.  Discussed exercise and checking cholesterol.  Discussed gun safety, safe sex, and contraception. Also recommend checking PSA.  Complete Medication List: 1)  Finasteride 5 Mg Tabs (Finasteride) .... One once daily 2)  Omeprazole 40 Mg Cpdr (Omeprazole) .... One by mouth once daily 3)  Amlodipine Besy-benazepril Hcl 10-20 Mg Caps (Amlodipine besy-benazepril hcl) .... One by mouth once daily 4)  Cardura 2 Mg Tabs (Doxazosin mesylate) .... One by mouth once daily 5)  Folic Acid 800 Mcg  6)  Fish Oil 1200mg   7)  Garlic 1000 Mg  8)  Vitamin E 400 Iu  9)   Asa 81mg   10)  Complete Vitamin   Hypertension Assessment/Plan:      The patient's hypertensive risk group is category B: At least one risk factor (excluding diabetes) with no target organ damage.  Today's blood pressure is 136/70.  His blood pressure goal is < 140/90.  Colorectal Screening:  Current Recommendations:    Hemoccult: NEG X 1 today  PSA Screening:    PSA: 0.22  (05/05/2010)    Reviewed PSA screening recommendations: PSA ordered  Immunization & Chemoprophylaxis:    Tetanus vaccine: Tdap  (11/23/2009)    Influenza vaccine: Historical  (07/07/2010)  Patient Instructions: 1)  Please schedule a follow-up appointment in 2 months. 2)  It is important that you exercise regularly at least 20 minutes 5 times a week. If you develop chest pain, have severe difficulty breathing, or feel very tired , stop exercising immediately and seek medical attention. 3)  You need to lose weight. Consider a lower calorie diet and regular exercise.  4)  Check your Blood Pressure regularly. If it is above 130/80:  you should make an appointment.   Orders Added: 1)  Sleep Disorder Referral [Sleep Disorder] 2)  Venipuncture [27035] 3)  TLB-Lipid Panel [80061-LIPID] 4)  TLB-BMP (Basic Metabolic Panel-BMET) [80048-METABOL] 5)  TLB-CBC Platelet - w/Differential [85025-CBCD] 6)  TLB-Hepatic/Liver Function Pnl [80076-HEPATIC] 7)  TLB-TSH (Thyroid Stimulating Hormone) [84443-TSH] 8)  TLB-PSA (Prostate Specific Antigen) [84153-PSA] 9)  TLB-Udip w/ Micro [81001-URINE] 10)  Hemoccult Guaiac-1 spec.(in office) [82270] 11)  Est. Patient Level III [00938] 12)  Est. Patient 40-64 years [99396]   Immunization History:  Influenza Immunization History:    Influenza:  historical (07/07/2010)   Immunization History:  Influenza Immunization History:    Influenza:  Historical (07/07/2010)

## 2010-12-15 LAB — URINALYSIS, ROUTINE W REFLEX MICROSCOPIC
Bilirubin Urine: NEGATIVE
Glucose, UA: NEGATIVE mg/dL
Hgb urine dipstick: NEGATIVE
Ketones, ur: NEGATIVE mg/dL
Protein, ur: 30 mg/dL — AB

## 2010-12-15 LAB — COMPREHENSIVE METABOLIC PANEL
ALT: 23 U/L (ref 0–53)
Calcium: 9.1 mg/dL (ref 8.4–10.5)
Creatinine, Ser: 0.95 mg/dL (ref 0.4–1.5)
GFR calc Af Amer: >60 mL/min (ref >60)
Glucose, Bld: 102 mg/dL — ABNORMAL HIGH (ref 70–99)
Sodium: 135 mEq/L (ref 135–145)
Total Protein: 7.7 g/dL (ref 6.0–8.3)

## 2010-12-15 LAB — URINE CULTURE
Culture  Setup Time: 201108080004
Culture: NO GROWTH

## 2010-12-15 LAB — DIFFERENTIAL
Eosinophils Absolute: 0.5 10*3/uL (ref 0.0–0.7)
Lymphocytes Relative: 21 % (ref 12–46)
Lymphs Abs: 2.6 10*3/uL (ref 0.7–4.0)
Monocytes Relative: 8 % (ref 3–12)
Neutrophils Relative %: 66 % (ref 43–77)

## 2010-12-15 LAB — LIPASE, BLOOD: Lipase: 27 U/L (ref 11–59)

## 2010-12-15 LAB — CBC
HCT: 41.8 % (ref 39.0–52.0)
MCH: 31.7 pg (ref 26.0–34.0)
MCHC: 34.6 g/dL (ref 30.0–36.0)
RDW: 13.4 % (ref 11.5–15.5)

## 2010-12-18 ENCOUNTER — Institutional Professional Consult (permissible substitution): Payer: Managed Care, Other (non HMO) | Admitting: Pulmonary Disease

## 2010-12-22 ENCOUNTER — Other Ambulatory Visit: Payer: Self-pay | Admitting: Internal Medicine

## 2011-01-26 ENCOUNTER — Ambulatory Visit: Payer: Managed Care, Other (non HMO) | Admitting: Internal Medicine

## 2011-02-13 ENCOUNTER — Encounter: Payer: Self-pay | Admitting: Pulmonary Disease

## 2011-02-14 ENCOUNTER — Encounter: Payer: Self-pay | Admitting: Pulmonary Disease

## 2011-02-14 ENCOUNTER — Ambulatory Visit (INDEPENDENT_AMBULATORY_CARE_PROVIDER_SITE_OTHER): Payer: Managed Care, Other (non HMO) | Admitting: Pulmonary Disease

## 2011-02-14 VITALS — BP 148/80 | HR 89 | Temp 98.4°F | Ht 66.0 in | Wt 222.4 lb

## 2011-02-14 DIAGNOSIS — G4733 Obstructive sleep apnea (adult) (pediatric): Secondary | ICD-10-CM | POA: Insufficient documentation

## 2011-02-14 NOTE — Assessment & Plan Note (Signed)
The pt's history is very suggestive of clinically significant SDB.  He has loud snoring, abnl breathing pattern during sleep, nonrestorative sleep, and severe EDS.  I have had a long discussion with the pt about sleep apnea, including its impact on QOL and CV health.  I have recommended a sleep study, and the patient is agreeable.

## 2011-02-14 NOTE — Progress Notes (Signed)
  Subjective:    [name], male    DOB: 05-Jan-1956, 55 y.o.   MRN: [id number]  HPI The pt is a 55y/o male who is referred for evaluation of possible osa.  His history is significant for the following: -has been noted to have loud snoring, abnormal breathing pattern by bed partner. -nonrestorative sleep -significant daytime sleepiness with dozing whenever he sits down.  Falls asleep with tv/movies. -some sleep pressure with driving longer distances.  -weight up 30 pounds past 2 yrs, epworth 18.  Sleep Questionnaire: What time do you typically go to bed?( Between what hours) 9 to 10 pm How long does it take you to fall asleep? about 1/2 hour How many times during the night do you wake up? 2 What time do you get out of bed to start your day? 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 (In pounds) 30 lb (13.608 kg) Have you ever had a sleep study before? No Do you currently use CPAP? No Do you wear oxygen at any time? No   Review of Systems  Constitutional: Positive for unexpected weight change. Negative for fever.  HENT: Positive for congestion, sneezing and dental problem. Negative for ear pain, nosebleeds, sore throat, rhinorrhea, trouble swallowing, postnasal drip and sinus pressure.   Eyes: Negative for redness and itching.  Respiratory: Positive for shortness of breath. Negative for cough, chest tightness and wheezing.   Cardiovascular: Positive for leg swelling. Negative for palpitations.  Gastrointestinal: Negative for nausea and vomiting.  Genitourinary: Negative for dysuria.  Musculoskeletal: Negative for joint swelling.  Skin: Negative for rash.  Neurological: Negative for headaches.  Hematological: Does not bruise/bleed easily.  Psychiatric/Behavioral: Negative for dysphoric mood. The patient is not nervous/anxious.        Objective:   Physical Exam Constitutional:  obese male, no acute distress  HENT:  Nares with deviated septum to left with narrowing, but patent  Oropharynx without exudate, palate and uvula are large, significant soft tissue narrowing posteriorly  Eyes:  Perrla, eomi, no scleral icterus  Neck:  No JVD, no TMG  Cardiovascular:  Normal rate, regular rhythm, no rubs or gallops.  No murmurs        Intact distal pulses  Pulmonary :  Normal breath sounds, no stridor or respiratory distress   No rales, rhonchi, or wheezing  Abdominal:  Soft, nondistended, bowel sounds present.  No tenderness noted.   Musculoskeletal: +1 lower extremity edema noted.  Lymph Nodes:  No cervical lymphadenopathy noted  Skin:  No cyanosis noted  Neurologic: appears sleepy, appropriate, moves all 4 extremities without obvious deficit.         Assessment & Plan:

## 2011-02-14 NOTE — Patient Instructions (Signed)
Will arrange for a sleep study, and will call for followup visit once results available Work on weight reduction.

## 2011-02-16 NOTE — Op Note (Signed)
NAME:  Steven Hickman, Steven Hickman                 ACCOUNT NO.:  [id number]   MEDICAL RECORD NO.:  [id number]          PATIENT TYPE:  AMB   LOCATION:  [facility]                         FACILITY:  [facility]   PHYSICIAN:  Excell Seltzer. Annabell Howells, M.D.    DATE OF BIRTH:  1955/12/30   DATE OF PROCEDURE:  08/29/2006  DATE OF DISCHARGE:                               OPERATIVE REPORT   PROCEDURE:  Right ureteroscopic stone extraction.   PREOPERATIVE DIAGNOSIS:  Right distal ureteral stone.   POSTOPERATIVE DIAGNOSIS:  Right distal ureteral stone.   SURGEON:  Dr. Bjorn Pippin.   ANESTHESIA:  General.   SPECIMEN:  Stone.   COMPLICATIONS:  None.   INDICATIONS:  [name] is a 55 year old white male who has a 4-5 mm right  distal ureteral stone.  He has elected ureteroscopic stone extraction.   FINDINGS AND PROCEDURE:  The patient is given Cipro, he was taken to the  operating room where general anesthetic was induced.  He was placed in  lithotomy position.  His perineum and genitalia were prepped with  Betadine solution and draped in the usual sterile fashion.  Cystoscopy  was performed using the 22-French scope and 12 and 70 degrees lenses.  Examination revealed a normal urethra.  The external sphincter was  intact.  The prostatic urethra short without obstruction.  Examination  of bladder revealed mild trabeculation.  No tumor, stones or  inflammation were noted.  Ureteral orifices were unremarkable.   The guidewire was passed up the right ureteral orifice.  It was noted to  pass right by distal stone on fluoroscopy.  Once in position was to the  kidney and the cystoscope was removed and the 12-French inner dilator of  an access sheath was passed over the wire just __________ the stone.   The 6-French short ureteroscope was then passed alongside the wire.  The  stone was visualized, grasped with nitinol basket and removed without  difficulty.  The ureteroscope was then repassed, no evidence of  significant ureteral  trauma or irritation was noted, so it was felt the  stent was not indicated and the guidewire was removed.  The  bladder was then drained.  The patient was taken down from lithotomy  position.  His anesthetic was reversed.  He was removed to recovery room  in stable condition.  The stone was given to his wife.  There were no  complications.  August 28, 2006      Excell Seltzer. Annabell Howells, M.D.  Electronically Signed     JJW/MEDQ  D:  08/29/2006  T:  08/29/2006  Job:  [id number]   cc:   Harrel Lemon. Merla Riches, M.D.  Fax: [phone]

## 2011-02-19 ENCOUNTER — Other Ambulatory Visit: Payer: Self-pay | Admitting: Internal Medicine

## 2011-03-14 ENCOUNTER — Ambulatory Visit (HOSPITAL_BASED_OUTPATIENT_CLINIC_OR_DEPARTMENT_OTHER): Payer: Managed Care, Other (non HMO) | Attending: Pulmonary Disease | Admitting: Pulmonary Disease

## 2011-03-14 DIAGNOSIS — G4733 Obstructive sleep apnea (adult) (pediatric): Secondary | ICD-10-CM | POA: Insufficient documentation

## 2011-03-25 DIAGNOSIS — G4733 Obstructive sleep apnea (adult) (pediatric): Secondary | ICD-10-CM

## 2011-03-26 NOTE — Procedures (Signed)
NAME:  Steven Hickman, Steven Hickman                 ACCOUNT NO.:  [id number]  MEDICAL RECORD NO.:  [id number]          PATIENT TYPE:  OUT  LOCATION:  S[facility]EP CENTER                 FACILITY:  [facility]  PHYSICIAN:  Barbaraann Share, MD,FCCPDATE OF BIRTH:  1956-03-02  DATE OF STUDY:  03/14/2011                           NOCTURNAL POLYSOMNOGRAM  REFERRING PHYSICIAN:  Barbaraann Share, MD,FCCP  INDICATION FOR STUDY:  Hypersomnia with sleep apnea.  EPWORTH SCORE:  16.  SLEEP ARCHITECTURE:  The patient had a total sleep time of 290 minutes with no slow wave sleep and only 99 minutes of REM.  Sleep onset latency was normal at 26 minutes, and REM onset was normal at 61 minutes.  Sleep efficiency was moderately reduced at 78%.  RESPIRATORY DATA:  The patient was found to have 138 apneas and 267 obstructive hypopneas, giving him an apnea/hypopnea index of 84 events per hour.  The events occurred primarily in the supine position and also during REM, and there was loud snoring noted throughout.  OXYGEN DATA:  There was O2 desaturation as low as 6%, with the majority of the desaturations occurring during REM.  CARDIAC DATA:  Occasional PAC and PVC noted throughout.  MOVEMENT/ PARASOMNIA:  The patient had no significant leg jerks or other abnormal behaviors seen.  IMPRESSION/RECOMMENDATIONS: 1. Severe obstructive sleep apnea/hypopnea syndrome with an AH I of 84     events per hour and oxygen desaturation as low as 60%.  Treatment     for this degree of sleep apnea should focus primarily on weight     loss as well as CPAP. 2. Occasional premature atrial contractions and premature ventricular     contractions noted, but no clinically significant arrhythmias were     seen.    Barbaraann Share, MD,FCCP Diplomate, American Board of Sleep Medicine Electronically Signed   [facility]/MEDQ  D:  03/25/2011 13:15:34  T:  03/26/2011 00:02:51  Job:  [id number]

## 2011-03-27 ENCOUNTER — Telehealth: Payer: Self-pay | Admitting: *Deleted

## 2011-03-27 NOTE — Telephone Encounter (Signed)
Scheduled appt for 3:30 tomorrow with KC.Steven Hickman

## 2011-03-27 NOTE — Telephone Encounter (Signed)
LM with family member for pt to call back to schedule ov with KC.

## 2011-03-28 ENCOUNTER — Ambulatory Visit (INDEPENDENT_AMBULATORY_CARE_PROVIDER_SITE_OTHER): Payer: Managed Care, Other (non HMO) | Admitting: Pulmonary Disease

## 2011-03-28 ENCOUNTER — Encounter: Payer: Self-pay | Admitting: Pulmonary Disease

## 2011-03-28 VITALS — BP 112/80 | HR 66 | Temp 97.6°F | Ht 66.0 in | Wt 203.8 lb

## 2011-03-28 DIAGNOSIS — G4733 Obstructive sleep apnea (adult) (pediatric): Secondary | ICD-10-CM

## 2011-03-28 NOTE — Patient Instructions (Signed)
Will start on cpap at moderate pressure level.  Please call me if having tolerance issues. Work on weight loss followup with me in 5 weeks.

## 2011-03-28 NOTE — Progress Notes (Signed)
  Subjective:    [name], male    DOB: June 08, 1956, 55 y.o.   MRN: [id number]  HPI The pt comes in today for f/u of his recent sleep study.  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  I have reviewed the study in detail with him and his wife, and answered all of their questions.    Review of Systems  Constitutional: Negative for fever and unexpected weight change.  HENT: Positive for congestion, sneezing, dental problem and sinus pressure. Negative for ear pain, nosebleeds, sore throat, rhinorrhea, trouble swallowing and postnasal drip.   Eyes: Positive for itching. Negative for redness.  Respiratory: Positive for shortness of breath. Negative for cough, chest tightness and wheezing.   Cardiovascular: Negative for palpitations and leg swelling.  Gastrointestinal: Negative for nausea and vomiting.  Genitourinary: Negative for dysuria.  Musculoskeletal: Positive for joint swelling.  Skin: Negative for rash.  Neurological: Negative for headaches.  Hematological: Does not bruise/bleed easily.  Psychiatric/Behavioral: Negative for dysphoric mood. The patient is not nervous/anxious.        Objective:   Physical Exam Obese male in nad Nares without obvious purulence or discharge LE without edema, no cyanosis noted. Appears sleepy, moves all 4       Assessment & Plan:

## 2011-03-28 NOTE — Assessment & Plan Note (Signed)
The pt has severe osa by his sleep study, and will require treatment with cpap while working on weight loss.  He is willing to proceed with this treatment plan.  I will set the patient up on cpap at a moderate pressure level to allow for desensitization, and will troubleshoot the device over the next 4-6weeks if needed.  The pt is to call me if having issues with tolerance.  Will then optimize the pressure once patient is able to wear cpap on a consistent basis.

## 2011-04-06 ENCOUNTER — Encounter: Payer: Self-pay | Admitting: Pulmonary Disease

## 2011-04-20 ENCOUNTER — Other Ambulatory Visit: Payer: Self-pay | Admitting: Internal Medicine

## 2011-05-04 ENCOUNTER — Encounter: Payer: Self-pay | Admitting: Pulmonary Disease

## 2011-05-04 ENCOUNTER — Ambulatory Visit (INDEPENDENT_AMBULATORY_CARE_PROVIDER_SITE_OTHER): Payer: Managed Care, Other (non HMO) | Admitting: Pulmonary Disease

## 2011-05-04 VITALS — BP 110/66 | HR 70 | Temp 98.1°F | Ht 66.0 in | Wt 198.0 lb

## 2011-05-04 DIAGNOSIS — G4733 Obstructive sleep apnea (adult) (pediatric): Secondary | ICD-10-CM

## 2011-05-04 NOTE — Patient Instructions (Signed)
Will have your equipment company work with you on mask fit. Will turn your machine to auto mode for 2 weeks, then get download to optimize your pressure for you. Work on weight loss followup with me in 6mos if doing well.

## 2011-05-04 NOTE — Progress Notes (Signed)
  Subjective:    [name], male    DOB: [birth year], 55 y.o.   MRN: [id number]  HPI The pt comes in today for f/u of his osa.  He has been wearing cpap compliantly by his download, and has seen significant improvement in his sleep and daytime alertness. He has no issues with the pressure, but is having a problem with intermittent mask leak.  His wife does not hear breakthru snoring unless his mask leaks.     Review of Systems  Constitutional: Negative for fever and unexpected weight change.  HENT: Positive for sneezing. Negative for ear pain, nosebleeds, congestion, sore throat, rhinorrhea, trouble swallowing, dental problem, postnasal drip and sinus pressure.   Eyes: Positive for itching. Negative for redness.  Respiratory: Negative for cough, chest tightness, shortness of breath and wheezing.   Cardiovascular: Negative for palpitations and leg swelling.  Gastrointestinal: Negative for nausea and vomiting.  Genitourinary: Negative for dysuria.  Musculoskeletal: Negative for joint swelling.  Skin: Negative for rash.  Neurological: Negative for headaches.  Hematological: Does not bruise/bleed easily.  Psychiatric/Behavioral: Negative for dysphoric mood. The patient is not nervous/anxious.        Objective:   Physical Exam Ow male in nad Nares without discharge, no skin breakdown or pressure necrosis from cpap mask Cor with rrr LE without edema, no cyanosis noted.  Alert, does not appear sleepy, moves all 4        Assessment & Plan:

## 2011-05-04 NOTE — Assessment & Plan Note (Signed)
The pt is doing much better with cpap.  He is sleeping fairly well, and daytime alertness has improved significantly.  He is no longer falling asleep at lunch breaks.  He has some issues with mask leaks, and will get his dme to work with him on this.  We also need to optimize pressure for him with auto mode.  I have encouraged him to work aggressively on weight loss.   Care Plan:  At this point, will arrange for the patient's machine to be changed over to auto mode for 2 weeks to optimize their pressure.  I will review the downloaded data once sent by dme, and also evaluate for compliance, leaks, and residual osa.  I will call the patient and dme to discuss the results, and have the patient's machine set appropriately.  This will serve as the pt's cpap pressure titration.

## 2011-05-24 ENCOUNTER — Other Ambulatory Visit: Payer: Self-pay | Admitting: Internal Medicine

## 2011-06-09 ENCOUNTER — Other Ambulatory Visit: Payer: Self-pay | Admitting: Pulmonary Disease

## 2011-06-09 DIAGNOSIS — G4733 Obstructive sleep apnea (adult) (pediatric): Secondary | ICD-10-CM

## 2011-08-28 ENCOUNTER — Other Ambulatory Visit: Payer: Self-pay | Admitting: Internal Medicine

## 2011-10-15 ENCOUNTER — Other Ambulatory Visit: Payer: Self-pay

## 2011-10-15 MED ORDER — FINASTERIDE 5 MG PO TABS: ORAL_TABLET | ORAL | Status: DC

## 2011-10-15 MED ORDER — AMLODIPINE BESY-BENAZEPRIL HCL 10-20 MG PO CAPS: ORAL_CAPSULE | ORAL | Status: DC

## 2011-10-15 MED ORDER — OMEPRAZOLE 40 MG PO CPDR: DELAYED_RELEASE_CAPSULE | ORAL | Status: DC

## 2011-11-01 ENCOUNTER — Ambulatory Visit (INDEPENDENT_AMBULATORY_CARE_PROVIDER_SITE_OTHER): Payer: Managed Care, Other (non HMO) | Admitting: Pulmonary Disease

## 2011-11-01 ENCOUNTER — Encounter: Payer: Self-pay | Admitting: Pulmonary Disease

## 2011-11-01 ENCOUNTER — Ambulatory Visit: Payer: Managed Care, Other (non HMO) | Admitting: Pulmonary Disease

## 2011-11-01 VITALS — BP 150/86 | HR 79 | Temp 98.0°F | Ht 66.0 in | Wt 186.2 lb

## 2011-11-01 DIAGNOSIS — G4733 Obstructive sleep apnea (adult) (pediatric): Secondary | ICD-10-CM

## 2011-11-01 NOTE — Progress Notes (Signed)
  Subjective:    [name], male    DOB: 1956/09/24, 56 y.o.   MRN: [id number]  HPI Patient comes in today for followup of his obstructive sleep apnea.  He is wearing CPAP compliantly, and feels that he is sleeping well with improved daytime alertness.  His pressure is been optimized to 12 cm, and overall he feels that his mask fit is adequate.  His family today shows adequate compliance, but I have asked him to increase his usage to 6 hours or more as much as possible.   Review of Systems  Constitutional: Positive for unexpected weight change. Negative for fever.  HENT: Positive for rhinorrhea. Negative for ear pain, nosebleeds, congestion, sore throat, sneezing, trouble swallowing, dental problem, postnasal drip and sinus pressure.   Eyes: Negative for redness and itching.  Respiratory: Negative for cough, chest tightness, shortness of breath and wheezing.   Cardiovascular: Negative for palpitations and leg swelling.  Gastrointestinal: Negative for nausea and vomiting.  Genitourinary: Negative for dysuria.  Musculoskeletal: Negative for joint swelling.  Skin: Negative for rash.  Neurological: Negative for headaches.  Hematological: Does not bruise/bleed easily.  Psychiatric/Behavioral: Negative for dysphoric mood. The patient is not nervous/anxious.        Objective:   Physical Exam Overweight male in no acute distress No skin breakdown or pressure necrosis from the CPAP mask Lower extremities with edema, no cyanosis noted Alert and oriented, moves all 4 extremities.       Assessment & Plan:

## 2011-11-01 NOTE — Patient Instructions (Signed)
Continue on cpap, and keep up with mask changes and supplies. Work on weight loss followup with me in one year, but call if having issues.

## 2011-11-01 NOTE — Assessment & Plan Note (Signed)
The patient is doing well on CPAP currently, is having no significant mask or pressure issues.  He feels that it has helped his sleep and daytime alertness significantly, and is satisfied with his progress.  I have asked him to keep up with his mask changes and supplies, and to followup with me in one year.  I've also encouraged him to work aggressively on weight loss.

## 2011-11-06 ENCOUNTER — Ambulatory Visit: Payer: Managed Care, Other (non HMO) | Admitting: Pulmonary Disease

## 2011-12-03 ENCOUNTER — Ambulatory Visit (INDEPENDENT_AMBULATORY_CARE_PROVIDER_SITE_OTHER): Payer: Managed Care, Other (non HMO) | Admitting: Internal Medicine

## 2011-12-03 ENCOUNTER — Encounter: Payer: Self-pay | Admitting: Internal Medicine

## 2011-12-03 ENCOUNTER — Other Ambulatory Visit (INDEPENDENT_AMBULATORY_CARE_PROVIDER_SITE_OTHER): Payer: Managed Care, Other (non HMO)

## 2011-12-03 VITALS — BP 110/72 | HR 70 | Temp 97.2°F | Wt 185.0 lb

## 2011-12-03 DIAGNOSIS — K219 Gastro-esophageal reflux disease without esophagitis: Secondary | ICD-10-CM

## 2011-12-03 DIAGNOSIS — Z0000 Encounter for general adult medical examination without abnormal findings: Secondary | ICD-10-CM

## 2011-12-03 DIAGNOSIS — Z136 Encounter for screening for cardiovascular disorders: Secondary | ICD-10-CM

## 2011-12-03 DIAGNOSIS — E785 Hyperlipidemia, unspecified: Secondary | ICD-10-CM

## 2011-12-03 DIAGNOSIS — N401 Enlarged prostate with lower urinary tract symptoms: Secondary | ICD-10-CM

## 2011-12-03 DIAGNOSIS — R9431 Abnormal electrocardiogram [ECG] [EKG]: Secondary | ICD-10-CM

## 2011-12-03 DIAGNOSIS — I10 Essential (primary) hypertension: Secondary | ICD-10-CM

## 2011-12-03 LAB — URINALYSIS, ROUTINE W REFLEX MICROSCOPIC
Bilirubin Urine: NEGATIVE
Ketones, ur: NEGATIVE
Specific Gravity, Urine: 1.02 (ref 1.000–1.030)
Urine Glucose: NEGATIVE
Urobilinogen, UA: 0.2 (ref 0.0–1.0)

## 2011-12-03 LAB — COMPREHENSIVE METABOLIC PANEL
ALT: 17 U/L (ref 0–53)
AST: 17 U/L (ref 0–37)
Albumin: 4 g/dL (ref 3.5–5.2)
Calcium: 9.5 mg/dL (ref 8.4–10.5)
Chloride: 103 mEq/L (ref 96–112)
Potassium: 4.9 mEq/L (ref 3.5–5.1)
Sodium: 140 mEq/L (ref 135–145)
Total Protein: 7 g/dL (ref 6.0–8.3)

## 2011-12-03 LAB — CBC WITH DIFFERENTIAL/PLATELET
Basophils Absolute: 0.1 10*3/uL (ref 0.0–0.1)
Eosinophils Absolute: 0.2 10*3/uL (ref 0.0–0.7)
Hemoglobin: 15 g/dL (ref 13.0–17.0)
Lymphocytes Relative: 30.2 % (ref 12.0–46.0)
MCHC: 34.5 g/dL (ref 30.0–36.0)
Neutro Abs: 4.8 10*3/uL (ref 1.4–7.7)
Neutrophils Relative %: 58.9 % (ref 43.0–77.0)
Platelets: 214 10*3/uL (ref 150.0–400.0)
RDW: 13.9 % (ref 11.5–14.6)

## 2011-12-03 LAB — LIPID PANEL: HDL: 59.6 mg/dL (ref >39.00)

## 2011-12-03 LAB — PSA: PSA: 0.33 ng/mL (ref 0.10–4.00)

## 2011-12-03 MED ORDER — AMLODIPINE BESY-BENAZEPRIL HCL 10-20 MG PO CAPS: ORAL_CAPSULE | ORAL | Status: DC

## 2011-12-03 MED ORDER — DOXAZOSIN MESYLATE 2 MG PO TABS: ORAL_TABLET | ORAL | Status: DC

## 2011-12-03 MED ORDER — FINASTERIDE 5 MG PO TABS: ORAL_TABLET | ORAL | Status: DC

## 2011-12-03 MED ORDER — OMEPRAZOLE 40 MG PO CPDR: DELAYED_RELEASE_CAPSULE | ORAL | Status: DC

## 2011-12-03 NOTE — Assessment & Plan Note (Signed)
Exam done, labs ordered, vaccines were updated, pt ed material was given 

## 2011-12-03 NOTE — Assessment & Plan Note (Signed)
continue the PPI

## 2011-12-03 NOTE — Assessment & Plan Note (Signed)
His BP is well controlled, I will check his lytes and renal function today 

## 2011-12-03 NOTE — Progress Notes (Signed)
Subjective:    [name], male    DOB: July 01, 1956, 56 y.o.   MRN: [id number]  Hypertension This is a chronic problem. The current episode started more than 1 year ago. The problem has been gradually improving since onset. The problem is controlled. Pertinent negatives include no anxiety, blurred vision, chest pain, headaches, malaise/fatigue, neck pain, orthopnea, palpitations, peripheral edema, PND, shortness of breath or sweats. There are no associated agents to hypertension. Past treatments include calcium channel blockers and ACE inhibitors. The current treatment provides significant improvement. Compliance problems include exercise and diet.       Review of Systems  Constitutional: Negative for fever, chills, malaise/fatigue, diaphoresis, activity change, appetite change, fatigue and unexpected weight change.  HENT: Negative.  Negative for neck pain.   Eyes: Negative.  Negative for blurred vision.  Respiratory: Negative for cough, chest tightness, shortness of breath, wheezing and stridor.   Cardiovascular: Negative for chest pain, palpitations, orthopnea, leg swelling and PND.  Gastrointestinal: Negative for nausea, vomiting, abdominal pain, diarrhea, constipation and blood in stool.  Genitourinary: Negative for dysuria, urgency, frequency, hematuria, flank pain, decreased urine volume, discharge, penile swelling, scrotal swelling, enuresis, difficulty urinating, genital sores, penile pain and testicular pain.  Musculoskeletal: Negative for myalgias, back pain, joint swelling, arthralgias and gait problem.  Skin: Negative for color change, pallor, rash and wound.  Neurological: Negative for dizziness, tremors, seizures, syncope, facial asymmetry, speech difficulty, weakness, light-headedness, numbness and headaches.  Hematological: Negative for adenopathy. Does not bruise/bleed easily.  Psychiatric/Behavioral: Negative.        Objective:   Physical Exam  Vitals  reviewed. Constitutional: He is oriented to person, place, and time. He appears well-developed and well-nourished. No distress.  HENT:  Head: Normocephalic and atraumatic.  Mouth/Throat: Oropharynx is clear and moist. No oropharyngeal exudate.  Eyes: Conjunctivae are normal. Right eye exhibits no discharge. Left eye exhibits no discharge. No scleral icterus.  Neck: Normal range of motion. Neck supple. No JVD present. No tracheal deviation present. No thyromegaly present.  Cardiovascular: Normal rate, regular rhythm, normal heart sounds and intact distal pulses.  Exam reveals no gallop and no friction rub.   No murmur heard. Pulmonary/Chest: Effort normal and breath sounds normal. No stridor. No respiratory distress. He has no wheezes. He has no rales. He exhibits no tenderness.  Abdominal: Soft. Bowel sounds are normal. He exhibits no distension and no mass. There is no tenderness. There is no rebound and no guarding. Hernia confirmed negative in the right inguinal area and confirmed negative in the left inguinal area.  Genitourinary: Prostate normal, testes normal and penis normal. Rectal exam shows no external hemorrhoid, no internal hemorrhoid, no fissure, no mass, no tenderness and anal tone normal. Guaiac negative stool. Prostate is not enlarged and not tender. Right testis shows no mass, no swelling and no tenderness. Right testis is descended. Left testis shows no mass, no swelling and no tenderness. Left testis is descended. Circumcised. No penile tenderness. No discharge found.  Musculoskeletal: Normal range of motion. He exhibits no edema and no tenderness.  Lymphadenopathy:    He has no cervical adenopathy.       Right: No inguinal adenopathy present.       Left: No inguinal adenopathy present.  Neurological: He is oriented to person, place, and time.  Skin: Skin is warm and dry. No rash noted. He is not diaphoretic. No erythema. No pallor.  Psychiatric: He has a normal mood and affect.  His behavior is normal. Judgment  and thought content normal.      Lab Results  Component Value Date   WBC 6.7 11/28/2010   HGB 14.5 11/28/2010   HCT 43.0 11/28/2010   PLT 216.0 11/28/2010   GLUCOSE 97 11/28/2010   CHOL 191 11/28/2010   TRIG 55.0 11/28/2010   HDL 59.30 11/28/2010   LDLDIRECT 140.4 11/23/2009   LDLCALC 121* 11/28/2010   ALT 40 11/28/2010   AST 25 11/28/2010   NA 137 11/28/2010   K 4.4 11/28/2010   CL 100 11/28/2010   CREATININE 0.9 11/28/2010   BUN 15 11/28/2010   CO2 29 11/28/2010   TSH 0.68 11/28/2010   PSA 0.14 11/28/2010   HGBA1C 5.6 10/11/2008      Assessment & Plan:

## 2011-12-03 NOTE — Patient Instructions (Signed)
Health Maintenance, Males A healthy lifestyle and preventative care can promote health and wellness.  Maintain regular health, dental, and eye exams.   Eat a healthy diet. Foods like vegetables, fruits, whole grains, low-fat dairy products, and lean protein foods contain the nutrients you need without too many calories. Decrease your intake of foods high in solid fats, added sugars, and salt. Get information about a proper diet from your caregiver, if necessary.   Regular physical exercise is one of the most important things you can do for your health. Most adults should get at least 150 minutes of moderate-intensity exercise (any activity that increases your heart rate and causes you to sweat) each week. In addition, most adults need muscle-strengthening exercises on 2 or more days a week.    Maintain a healthy weight. The body mass index (BMI) is a screening tool to identify possible weight problems. It provides an estimate of body fat based on height and weight. Your caregiver can help determine your BMI, and can help you achieve or maintain a healthy weight. For adults 20 years and older:   A BMI below 18.5 is considered underweight.   A BMI of 18.5 to 24.9 is normal.   A BMI of 25 to 29.9 is considered overweight.   A BMI of 30 and above is considered obese.   Maintain normal blood lipids and cholesterol by exercising and minimizing your intake of saturated fat. Eat a balanced diet with plenty of fruits and vegetables. Blood tests for lipids and cholesterol should begin at age 20 and be repeated every 5 years. If your lipid or cholesterol levels are high, you are over 50, or you are a high risk for heart disease, you may need your cholesterol levels checked more frequently.Ongoing high lipid and cholesterol levels should be treated with medicines, if diet and exercise are not effective.   If you smoke, find out from your caregiver how to quit. If you do not use tobacco, do not start.    If you choose to drink alcohol, do not exceed 2 drinks per day. One drink is considered to be 12 ounces (355 mL) of beer, 5 ounces (148 mL) of wine, or 1.5 ounces (44 mL) of liquor.   Avoid use of street drugs. Do not share needles with anyone. Ask for help if you need support or instructions about stopping the use of drugs.   High blood pressure causes heart disease and increases the risk of stroke. Blood pressure should be checked at least every 1 to 2 years. Ongoing high blood pressure should be treated with medicines if weight loss and exercise are not effective.   If you are 45 to 54 years old, ask your caregiver if you should take aspirin to prevent heart disease.   Diabetes screening involves taking a blood sample to check your fasting blood sugar level. This should be done once every 3 years, after age 45, if you are within normal weight and without risk factors for diabetes. Testing should be considered at a younger age or be carried out more frequently if you are overweight and have at least 1 risk factor for diabetes.   Colorectal cancer can be detected and often prevented. Most routine colorectal cancer screening begins at the age of 50 and continues through age 75. However, your caregiver may recommend screening at an earlier age if you have risk factors for colon cancer. On a yearly basis, your caregiver may provide home test kits to check for hidden   blood in the stool. Use of a small camera at the end of a tube, to directly examine the colon (sigmoidoscopy or colonoscopy), can detect the earliest forms of colorectal cancer. Talk to your caregiver about this at age 50, when routine screening begins. Direct examination of the colon should be repeated every 5 to 10 years through age 75, unless early forms of pre-cancerous polyps or small growths are found.   Hepatitis C blood testing is recommended for all people born from 1945 through 1965 and any individual with known risks for  hepatitis C.   Healthy men should no longer receive prostate-specific antigen (PSA) blood tests as part of routine cancer screening. Consult with your caregiver about prostate cancer screening.   Testicular cancer screening is not recommended for adolescents or adult males who have no symptoms. Screening includes self-exam, caregiver exam, and other screening tests. Consult with your caregiver about any symptoms you have or any concerns you have about testicular cancer.   Practice safe sex. Use condoms and avoid high-risk sexual practices to reduce the spread of sexually transmitted infections (STIs).   Use sunscreen with a sun protection factor (SPF) of 30 or greater. Apply sunscreen liberally and repeatedly throughout the day. You should seek shade when your shadow is shorter than you. Protect yourself by wearing long sleeves, pants, a wide-brimmed hat, and sunglasses year round, whenever you are outdoors.   Notify your caregiver of new moles or changes in moles, especially if there is a change in shape or color. Also notify your caregiver if a mole is larger than the size of a pencil eraser.   A one-time screening for abdominal aortic aneurysm (AAA) and surgical repair of large AAAs by sound wave imaging (ultrasonography) is recommended for ages 65 to 75 years who are current or former smokers.   Stay current with your immunizations.  Document Released: 03/15/2008 Document Revised: 09/06/2011 Document Reviewed: 02/12/2011 [employer] Patient Information 2012 [employer]. 

## 2011-12-03 NOTE — Assessment & Plan Note (Signed)
EKG is unchanged and he has no s/s

## 2011-12-04 ENCOUNTER — Encounter: Payer: Self-pay | Admitting: Internal Medicine

## 2012-01-07 ENCOUNTER — Other Ambulatory Visit: Payer: Self-pay | Admitting: Internal Medicine

## 2012-02-12 ENCOUNTER — Ambulatory Visit (INDEPENDENT_AMBULATORY_CARE_PROVIDER_SITE_OTHER): Payer: Managed Care, Other (non HMO) | Admitting: Professional

## 2012-02-12 DIAGNOSIS — F1210 Cannabis abuse, uncomplicated: Secondary | ICD-10-CM

## 2012-02-19 ENCOUNTER — Ambulatory Visit (INDEPENDENT_AMBULATORY_CARE_PROVIDER_SITE_OTHER): Payer: Managed Care, Other (non HMO) | Admitting: Professional

## 2012-02-19 DIAGNOSIS — F1210 Cannabis abuse, uncomplicated: Secondary | ICD-10-CM

## 2012-02-26 ENCOUNTER — Ambulatory Visit (INDEPENDENT_AMBULATORY_CARE_PROVIDER_SITE_OTHER): Payer: Managed Care, Other (non HMO) | Admitting: Professional

## 2012-02-26 DIAGNOSIS — F1210 Cannabis abuse, uncomplicated: Secondary | ICD-10-CM

## 2012-03-21 ENCOUNTER — Other Ambulatory Visit: Payer: Self-pay | Admitting: Internal Medicine

## 2012-04-28 ENCOUNTER — Other Ambulatory Visit: Payer: Self-pay | Admitting: Internal Medicine

## 2012-04-30 ENCOUNTER — Other Ambulatory Visit: Payer: Self-pay | Admitting: Internal Medicine

## 2012-06-26 ENCOUNTER — Other Ambulatory Visit: Payer: Self-pay | Admitting: Internal Medicine

## 2012-08-09 ENCOUNTER — Other Ambulatory Visit: Payer: Self-pay | Admitting: Internal Medicine

## 2012-10-09 ENCOUNTER — Other Ambulatory Visit: Payer: Self-pay | Admitting: Internal Medicine

## 2012-10-31 ENCOUNTER — Ambulatory Visit: Payer: Managed Care, Other (non HMO) | Admitting: Pulmonary Disease

## 2012-11-19 ENCOUNTER — Other Ambulatory Visit: Payer: Self-pay | Admitting: Internal Medicine

## 2012-12-12 ENCOUNTER — Ambulatory Visit (INDEPENDENT_AMBULATORY_CARE_PROVIDER_SITE_OTHER): Payer: Managed Care, Other (non HMO) | Admitting: Internal Medicine

## 2012-12-12 ENCOUNTER — Encounter: Payer: Self-pay | Admitting: Internal Medicine

## 2012-12-12 ENCOUNTER — Other Ambulatory Visit (INDEPENDENT_AMBULATORY_CARE_PROVIDER_SITE_OTHER): Payer: Managed Care, Other (non HMO)

## 2012-12-12 VITALS — BP 112/72 | HR 68 | Temp 97.8°F | Resp 16 | Ht 66.0 in | Wt 184.0 lb

## 2012-12-12 DIAGNOSIS — I10 Essential (primary) hypertension: Secondary | ICD-10-CM

## 2012-12-12 DIAGNOSIS — Z0000 Encounter for general adult medical examination without abnormal findings: Secondary | ICD-10-CM

## 2012-12-12 DIAGNOSIS — R9431 Abnormal electrocardiogram [ECG] [EKG]: Secondary | ICD-10-CM

## 2012-12-12 DIAGNOSIS — N401 Enlarged prostate with lower urinary tract symptoms: Secondary | ICD-10-CM

## 2012-12-12 DIAGNOSIS — E785 Hyperlipidemia, unspecified: Secondary | ICD-10-CM

## 2012-12-12 LAB — CBC WITH DIFFERENTIAL/PLATELET
Basophils Relative: 0.7 % (ref 0.0–3.0)
Eosinophils Relative: 1.8 % (ref 0.0–5.0)
HCT: 40.5 % (ref 39.0–52.0)
Hemoglobin: 13.8 g/dL (ref 13.0–17.0)
Lymphs Abs: 2 10*3/uL (ref 0.7–4.0)
Monocytes Relative: 6.4 % (ref 3.0–12.0)
Platelets: 283 10*3/uL (ref 150.0–400.0)
RBC: 4.54 Mil/uL (ref 4.22–5.81)
WBC: 6.7 10*3/uL (ref 4.5–10.5)

## 2012-12-12 LAB — URINALYSIS, ROUTINE W REFLEX MICROSCOPIC
Ketones, ur: NEGATIVE
Specific Gravity, Urine: 1.02 (ref 1.000–1.030)
Total Protein, Urine: NEGATIVE
Urine Glucose: NEGATIVE

## 2012-12-12 LAB — LIPID PANEL
HDL: 54.9 mg/dL (ref >39.00)
Triglycerides: 73 mg/dL (ref 0.0–149.0)
VLDL: 14.6 mg/dL (ref 0.0–40.0)

## 2012-12-12 LAB — COMPREHENSIVE METABOLIC PANEL
BUN: 12 mg/dL (ref 6–23)
CO2: 27 mEq/L (ref 19–32)
Creatinine, Ser: 0.9 mg/dL (ref 0.4–1.5)
GFR: 95.57 mL/min (ref >60.00)
Glucose, Bld: 104 mg/dL — ABNORMAL HIGH (ref 70–99)
Total Bilirubin: 0.5 mg/dL (ref 0.3–1.2)

## 2012-12-12 LAB — PSA: PSA: 0.31 ng/mL (ref 0.10–4.00)

## 2012-12-12 LAB — TSH: TSH: 0.7 u[IU]/mL (ref 0.35–5.50)

## 2012-12-12 NOTE — Patient Instructions (Signed)
Health Maintenance, Males A healthy lifestyle and preventative care can promote health and wellness.  Maintain regular health, dental, and eye exams.  Eat a healthy diet. Foods like vegetables, fruits, whole grains, low-fat dairy products, and lean protein foods contain the nutrients you need without too many calories. Decrease your intake of foods high in solid fats, added sugars, and salt. Get information about a proper diet from your caregiver, if necessary.  Regular physical exercise is one of the most important things you can do for your health. Most adults should get at least 150 minutes of moderate-intensity exercise (any activity that increases your heart rate and causes you to sweat) each week. In addition, most adults need muscle-strengthening exercises on 2 or more days a week.   Maintain a healthy weight. The body mass index (BMI) is a screening tool to identify possible weight problems. It provides an estimate of body fat based on height and weight. Your caregiver can help determine your BMI, and can help you achieve or maintain a healthy weight. For adults 20 years and older:  A BMI below 18.5 is considered underweight.  A BMI of 18.5 to 24.9 is normal.  A BMI of 25 to 29.9 is considered overweight.  A BMI of 30 and above is considered obese.  Maintain normal blood lipids and cholesterol by exercising and minimizing your intake of saturated fat. Eat a balanced diet with plenty of fruits and vegetables. Blood tests for lipids and cholesterol should begin at age 20 and be repeated every 5 years. If your lipid or cholesterol levels are high, you are over 50, or you are a high risk for heart disease, you may need your cholesterol levels checked more frequently.Ongoing high lipid and cholesterol levels should be treated with medicines, if diet and exercise are not effective.  If you smoke, find out from your caregiver how to quit. If you do not use tobacco, do not start.  If you  choose to drink alcohol, do not exceed 2 drinks per day. One drink is considered to be 12 ounces (355 mL) of beer, 5 ounces (148 mL) of wine, or 1.5 ounces (44 mL) of liquor.  Avoid use of street drugs. Do not share needles with anyone. Ask for help if you need support or instructions about stopping the use of drugs.  High blood pressure causes heart disease and increases the risk of stroke. Blood pressure should be checked at least every 1 to 2 years. Ongoing high blood pressure should be treated with medicines if weight loss and exercise are not effective.  If you are 45 to 55 years old, ask your caregiver if you should take aspirin to prevent heart disease.  Diabetes screening involves taking a blood sample to check your fasting blood sugar level. This should be done once every 3 years, after age 45, if you are within normal weight and without risk factors for diabetes. Testing should be considered at a younger age or be carried out more frequently if you are overweight and have at least 1 risk factor for diabetes.  Colorectal cancer can be detected and often prevented. Most routine colorectal cancer screening begins at the age of 50 and continues through age 75. However, your caregiver may recommend screening at an earlier age if you have risk factors for colon cancer. On a yearly basis, your caregiver may provide home test kits to check for hidden blood in the stool. Use of a small camera at the end of a tube,   to directly examine the colon (sigmoidoscopy or colonoscopy), can detect the earliest forms of colorectal cancer. Talk to your caregiver about this at age 50, when routine screening begins. Direct examination of the colon should be repeated every 5 to 10 years through age 75, unless early forms of pre-cancerous polyps or small growths are found.  Hepatitis C blood testing is recommended for all people born from 1945 through 1965 and any individual with known risks for hepatitis C.  Healthy  men should no longer receive prostate-specific antigen (PSA) blood tests as part of routine cancer screening. Consult with your caregiver about prostate cancer screening.  Testicular cancer screening is not recommended for adolescents or adult males who have no symptoms. Screening includes self-exam, caregiver exam, and other screening tests. Consult with your caregiver about any symptoms you have or any concerns you have about testicular cancer.  Practice safe sex. Use condoms and avoid high-risk sexual practices to reduce the spread of sexually transmitted infections (STIs).  Use sunscreen with a sun protection factor (SPF) of 30 or greater. Apply sunscreen liberally and repeatedly throughout the day. You should seek shade when your shadow is shorter than you. Protect yourself by wearing long sleeves, pants, a wide-brimmed hat, and sunglasses year round, whenever you are outdoors.  Notify your caregiver of new moles or changes in moles, especially if there is a change in shape or color. Also notify your caregiver if a mole is larger than the size of a pencil eraser.  A one-time screening for abdominal aortic aneurysm (AAA) and surgical repair of large AAAs by sound wave imaging (ultrasonography) is recommended for ages 65 to 75 years who are current or former smokers.  Stay current with your immunizations. Document Released: 03/15/2008 Document Revised: 12/10/2011 Document Reviewed: 02/12/2011 [employer] Patient Information 2013 [employer].  

## 2012-12-12 NOTE — Progress Notes (Signed)
Subjective:    [name], male    DOB: 05-Apr-1956, 57 y.o.   MRN: [id number]  Hypertension This is a chronic problem. The current episode started more than 1 year ago. The problem has been gradually improving since onset. The problem is controlled. Pertinent negatives include no anxiety, blurred vision, chest pain, headaches, malaise/fatigue, neck pain, orthopnea, palpitations, peripheral edema, PND, shortness of breath or sweats. Past treatments include calcium channel blockers, ACE inhibitors and alpha 1 blockers. The current treatment provides significant improvement. Compliance problems include exercise and diet.       Review of Systems  Constitutional: Negative.  Negative for fever, chills, malaise/fatigue, diaphoresis, activity change, appetite change, fatigue and unexpected weight change.  HENT: Negative.  Negative for neck pain.   Eyes: Negative.  Negative for blurred vision.  Respiratory: Negative.  Negative for apnea, cough, chest tightness, shortness of breath, wheezing and stridor.   Cardiovascular: Negative for chest pain, palpitations, orthopnea, leg swelling and PND.  Gastrointestinal: Negative for nausea, vomiting, abdominal pain, diarrhea, constipation, abdominal distention and anal bleeding.  Endocrine: Negative.   Genitourinary: Negative.  Negative for dysuria, flank pain and enuresis.  Musculoskeletal: Negative.   Skin: Negative.   Allergic/Immunologic: Negative.   Neurological: Negative for dizziness, tremors, syncope, facial asymmetry, speech difficulty, weakness, light-headedness, numbness and headaches.  Hematological: Negative.  Negative for adenopathy. Does not bruise/bleed easily.  Psychiatric/Behavioral: Negative.        Objective:   Physical Exam  Vitals reviewed. Constitutional: He is oriented to person, place, and time. He appears well-developed and well-nourished. No distress.  HENT:  Head: Normocephalic and atraumatic.  Mouth/Throat:  Oropharynx is clear and moist. No oropharyngeal exudate.  Eyes: Conjunctivae are normal. Right eye exhibits no discharge. Left eye exhibits no discharge. No scleral icterus.  Neck: Normal range of motion. Neck supple. No JVD present. No tracheal deviation present. No thyromegaly present.  Cardiovascular: Normal rate, regular rhythm, normal heart sounds and intact distal pulses.  Exam reveals no gallop and no friction rub.   No murmur heard. Pulmonary/Chest: Effort normal and breath sounds normal. No stridor. No respiratory distress. He has no wheezes. He has no rales. He exhibits no tenderness.  Abdominal: Soft. Bowel sounds are normal. He exhibits no distension and no mass. There is no tenderness. There is no rebound and no guarding. Hernia confirmed negative in the right inguinal area and confirmed negative in the left inguinal area.  Genitourinary: Testes normal and penis normal. Right testis shows no mass, no swelling and no tenderness. Right testis is descended. Left testis shows no mass, no swelling and no tenderness. Left testis is descended. Circumcised. No penile erythema or penile tenderness. No discharge found.  Musculoskeletal: Normal range of motion. He exhibits no edema and no tenderness.  Lymphadenopathy:    He has no cervical adenopathy.       Right: No inguinal adenopathy present.       Left: No inguinal adenopathy present.  Neurological: He is oriented to person, place, and time.  Skin: Skin is warm and dry. No rash noted. He is not diaphoretic. No erythema. No pallor.  Psychiatric: He has a normal mood and affect. His behavior is normal. Judgment and thought content normal.      Lab Results  Component Value Date   WBC 8.2 12/03/2011   HGB 15.0 12/03/2011   HCT 43.4 12/03/2011   PLT 214.0 12/03/2011   GLUCOSE 90 12/03/2011   CHOL 201* 12/03/2011   TRIG 111.0 12/03/2011  HDL 59.60 12/03/2011   LDLDIRECT 121.6 12/03/2011   LDLCALC 121* 11/28/2010   ALT 17 12/03/2011   AST 17 12/03/2011    NA 140 12/03/2011   K 4.9 12/03/2011   CL 103 12/03/2011   CREATININE 0.9 12/03/2011   BUN 16 12/03/2011   CO2 30 12/03/2011   TSH 0.69 12/03/2011   PSA 0.33 12/03/2011   HGBA1C 5.6 10/11/2008      Assessment & Plan:

## 2012-12-13 NOTE — Assessment & Plan Note (Signed)
His BP is well controlled Today I will check his lytes and renal function 

## 2012-12-13 NOTE — Assessment & Plan Note (Signed)
PSA today

## 2012-12-13 NOTE — Assessment & Plan Note (Signed)
FLP today 

## 2012-12-13 NOTE — Assessment & Plan Note (Signed)
He has a rare PAC, this is unchanged and he does not have any symptoms

## 2012-12-13 NOTE — Assessment & Plan Note (Signed)
Exam done Vaccines were reviewed Labs ordered Pt ed material was given 

## 2012-12-25 ENCOUNTER — Telehealth: Payer: Self-pay | Admitting: Internal Medicine

## 2012-12-25 NOTE — Telephone Encounter (Signed)
Patient did not receive his letter, he would like a call back with his lab results

## 2012-12-25 NOTE — Telephone Encounter (Signed)
Spoke with wife who advise letter arrived today

## 2013-01-14 ENCOUNTER — Other Ambulatory Visit: Payer: Self-pay | Admitting: Internal Medicine

## 2013-02-24 ENCOUNTER — Other Ambulatory Visit: Payer: Self-pay | Admitting: Internal Medicine

## 2013-05-03 ENCOUNTER — Other Ambulatory Visit: Payer: Self-pay | Admitting: Internal Medicine

## 2013-05-22 ENCOUNTER — Ambulatory Visit (INDEPENDENT_AMBULATORY_CARE_PROVIDER_SITE_OTHER): Payer: Managed Care, Other (non HMO) | Admitting: Internal Medicine

## 2013-05-22 ENCOUNTER — Encounter: Payer: Self-pay | Admitting: Internal Medicine

## 2013-05-22 ENCOUNTER — Ambulatory Visit (INDEPENDENT_AMBULATORY_CARE_PROVIDER_SITE_OTHER)
Admission: RE | Admit: 2013-05-22 | Discharge: 2013-05-22 | Disposition: A | Discharge status: Home / Self Care | Payer: Managed Care, Other (non HMO) | Source: Ambulatory Visit | Attending: Internal Medicine | Admitting: Internal Medicine

## 2013-05-22 VITALS — BP 142/86 | HR 83 | Temp 98.7°F | Resp 16 | Wt 179.8 lb

## 2013-05-22 DIAGNOSIS — M545 Low back pain: Secondary | ICD-10-CM

## 2013-05-22 DIAGNOSIS — M5126 Other intervertebral disc displacement, lumbar region: Secondary | ICD-10-CM

## 2013-05-22 MED ORDER — METHYLPREDNISOLONE ACETATE 80 MG/ML IJ SUSP
120.0000 mg | Freq: Once | INTRAMUSCULAR | Status: AC
  Administered 2013-05-22: 120 mg via INTRAMUSCULAR

## 2013-05-22 NOTE — Patient Instructions (Signed)
Back Pain, Adult  Low back pain is very common. About 1 in 5 people have back pain. The cause of low back pain is rarely dangerous. The pain often gets better over time. About half of people with a sudden onset of back pain feel better in just 2 weeks. About 8 in 10 people feel better by 6 weeks.   CAUSES  Some common causes of back pain include:  · Strain of the muscles or ligaments supporting the spine.  · Wear and tear (degeneration) of the spinal discs.  · Arthritis.  · Direct injury to the back.  DIAGNOSIS  Most of the time, the direct cause of low back pain is not known. However, back pain can be treated effectively even when the exact cause of the pain is unknown. Answering your caregiver's questions about your overall health and symptoms is one of the most accurate ways to make sure the cause of your pain is not dangerous. If your caregiver needs more information, he or she may order lab work or imaging tests (X-rays or MRIs). However, even if imaging tests show changes in your back, this usually does not require surgery.  HOME CARE INSTRUCTIONS  For many people, back pain returns. Since low back pain is rarely dangerous, it is often a condition that people can learn to manage on their own.   · Remain active. It is stressful on the back to sit or stand in one place. Do not sit, drive, or stand in one place for more than 30 minutes at a time. Take short walks on level surfaces as soon as pain allows. Try to increase the length of time you walk each day.  · Do not stay in bed. Resting more than 1 or 2 days can delay your recovery.  · Do not avoid exercise or work. Your body is made to move. It is not dangerous to be active, even though your back may hurt. Your back will likely heal faster if you return to being active before your pain is gone.  · Pay attention to your body when you  bend and lift. Many people have less discomfort when lifting if they bend their knees, keep the load close to their bodies, and  avoid twisting. Often, the most comfortable positions are those that put less stress on your recovering back.  · Find a comfortable position to sleep. Use a firm mattress and lie on your side with your knees slightly bent. If you lie on your back, put a pillow under your knees.  · Only take over-the-counter or prescription medicines as directed by your caregiver. Over-the-counter medicines to reduce pain and inflammation are often the most helpful. Your caregiver may prescribe muscle relaxant drugs. These medicines help dull your pain so you can more quickly return to your normal activities and healthy exercise.  · Put ice on the injured area.  · Put ice in a plastic bag.  · Place a towel between your skin and the bag.  · Leave the ice on for 15-20 minutes, 3-4 times a day for the first 2 to 3 days. After that, ice and heat may be alternated to reduce pain and spasms.  · Ask your caregiver about trying back exercises and gentle massage. This may be of some benefit.  · Avoid feeling anxious or stressed. Stress increases muscle tension and can worsen back pain. It is important to recognize when you are anxious or stressed and learn ways to manage it. Exercise is a great option.  SEEK MEDICAL CARE IF:  · You have pain that is not relieved with rest or   medicine.  · You have pain that does not improve in 1 week.  · You have new symptoms.  · You are generally not feeling well.  SEEK IMMEDIATE MEDICAL CARE IF:   · You have pain that radiates from your back into your legs.  · You develop new bowel or bladder control problems.  · You have unusual weakness or numbness in your arms or legs.  · You develop nausea or vomiting.  · You develop abdominal pain.  · You feel faint.  Document Released: 09/17/2005 Document Revised: 03/18/2012 Document Reviewed: 02/05/2011  [employer]® Patient Information ©2014 [employer].

## 2013-05-24 NOTE — Assessment & Plan Note (Signed)
Back xray is ok Neuro exam is normal and he does not complain of radicular s/s Will give a depo-medrol injection for the pain and inflammation

## 2013-05-24 NOTE — Progress Notes (Signed)
Subjective:    [name], male    DOB: 11-12-1955, 57 y.o.   MRN: [id number]  Back Pain This is a new problem. The current episode started yesterday. The problem occurs constantly. The problem is unchanged. The pain is present in the lumbar spine. The quality of the pain is described as aching. The pain does not radiate. The pain is at a severity of 2/10. The pain is mild. The pain is worse during the day. The symptoms are aggravated by bending, standing and sitting. Pertinent negatives include no abdominal pain, bladder incontinence, bowel incontinence, chest pain, dysuria, fever, headaches, leg pain, numbness, paresis, paresthesias, pelvic pain, perianal numbness, tingling, weakness or weight loss. Risk factors include recent trauma, obesity, poor posture and lack of exercise (he lifted a heavy objesct and felt a sudden pain in his low back). He has tried NSAIDs for the symptoms. The treatment provided moderate relief.      Review of Systems  Constitutional: Negative for fever and weight loss.  Cardiovascular: Negative for chest pain.  Gastrointestinal: Negative for abdominal pain and bowel incontinence.  Genitourinary: Negative for bladder incontinence, dysuria and pelvic pain.  Musculoskeletal: Positive for back pain.  Neurological: Negative for tingling, weakness, numbness, headaches and paresthesias.  All other systems reviewed and are negative.       Objective:   Physical Exam  Vitals reviewed. Constitutional: He is oriented to person, place, and time. He appears well-developed and well-nourished. No distress.  HENT:  Head: Normocephalic and atraumatic.  Mouth/Throat: No oropharyngeal exudate.  Eyes: Conjunctivae are normal. Right eye exhibits no discharge. Left eye exhibits no discharge. No scleral icterus.  Neck: Normal range of motion. Neck supple. No JVD present. No tracheal deviation present. No thyromegaly present.  Cardiovascular: Normal rate, regular  rhythm, normal heart sounds and intact distal pulses.  Exam reveals no gallop and no friction rub.   No murmur heard. Pulmonary/Chest: Effort normal and breath sounds normal. No stridor. No respiratory distress. He has no wheezes. He has no rales. He exhibits no tenderness.  Abdominal: Soft. Bowel sounds are normal. He exhibits no distension and no mass. There is no tenderness. There is no rebound and no guarding.  Musculoskeletal: Normal range of motion. He exhibits no edema and no tenderness.       Lumbar back: Normal. He exhibits normal range of motion, no tenderness, no bony tenderness, no swelling, no edema, no deformity, no laceration, no pain, no spasm and normal pulse.  Lymphadenopathy:    He has no cervical adenopathy.  Neurological: He is alert and oriented to person, place, and time. He has normal strength. He displays no atrophy, no tremor and normal reflexes. No cranial nerve deficit or sensory deficit. He exhibits normal muscle tone. He displays a negative Romberg sign. He displays no seizure activity. Coordination and gait normal.  Reflex Scores:      Tricep reflexes are 1+ on the right side and 1+ on the left side.      Bicep reflexes are 1+ on the right side and 1+ on the left side.      Brachioradialis reflexes are 1+ on the right side and 1+ on the left side.      Patellar reflexes are 1+ on the right side and 1+ on the left side.      Achilles reflexes are 1+ on the right side and 1+ on the left side. Neg SLR in BLE  Skin: Skin is warm and dry. No rash noted.  He is not diaphoretic. No erythema. No pallor.  Psychiatric: He has a normal mood and affect. His behavior is normal. Judgment and thought content normal.          Assessment & Plan:

## 2013-05-24 NOTE — Assessment & Plan Note (Signed)
He has no radicular s/s He will continue nsaids as needed I gave him an injection of depo-medrol IM for the pain and inflammation

## 2013-06-14 ENCOUNTER — Other Ambulatory Visit: Payer: Self-pay | Admitting: Internal Medicine

## 2013-10-02 ENCOUNTER — Other Ambulatory Visit: Payer: Self-pay | Admitting: Internal Medicine

## 2014-01-22 ENCOUNTER — Encounter: Payer: Self-pay | Admitting: Internal Medicine

## 2014-01-22 ENCOUNTER — Ambulatory Visit (INDEPENDENT_AMBULATORY_CARE_PROVIDER_SITE_OTHER): Payer: Managed Care, Other (non HMO) | Admitting: Internal Medicine

## 2014-01-22 ENCOUNTER — Other Ambulatory Visit (INDEPENDENT_AMBULATORY_CARE_PROVIDER_SITE_OTHER): Payer: Managed Care, Other (non HMO)

## 2014-01-22 VITALS — BP 122/70 | HR 76 | Temp 98.0°F | Resp 16 | Ht 66.0 in | Wt 183.0 lb

## 2014-01-22 DIAGNOSIS — R7309 Other abnormal glucose: Secondary | ICD-10-CM

## 2014-01-22 DIAGNOSIS — I10 Essential (primary) hypertension: Secondary | ICD-10-CM

## 2014-01-22 DIAGNOSIS — Z0000 Encounter for general adult medical examination without abnormal findings: Secondary | ICD-10-CM

## 2014-01-22 DIAGNOSIS — R319 Hematuria, unspecified: Secondary | ICD-10-CM

## 2014-01-22 DIAGNOSIS — E785 Hyperlipidemia, unspecified: Secondary | ICD-10-CM

## 2014-01-22 LAB — HEMOGLOBIN A1C: Hgb A1c MFr Bld: 5.1 % (ref 4.6–6.5)

## 2014-01-22 LAB — URINALYSIS, ROUTINE W REFLEX MICROSCOPIC
Bilirubin Urine: NEGATIVE
Ketones, ur: NEGATIVE
LEUKOCYTES UA: NEGATIVE
NITRITE: NEGATIVE
SPECIFIC GRAVITY, URINE: 1.015 (ref 1.000–1.030)
Total Protein, Urine: NEGATIVE
Urine Glucose: NEGATIVE
Urobilinogen, UA: 0.2 (ref 0.0–1.0)
pH: 7 (ref 5.0–8.0)

## 2014-01-22 LAB — CBC WITH DIFFERENTIAL/PLATELET
BASOS PCT: 0.6 % (ref 0.0–3.0)
Basophils Absolute: 0 10*3/uL (ref 0.0–0.1)
EOS ABS: 0.1 10*3/uL (ref 0.0–0.7)
Eosinophils Relative: 1.8 % (ref 0.0–5.0)
HCT: 45.3 % (ref 39.0–52.0)
HEMOGLOBIN: 15.3 g/dL (ref 13.0–17.0)
Lymphocytes Relative: 34.1 % (ref 12.0–46.0)
Lymphs Abs: 2.6 10*3/uL (ref 0.7–4.0)
MCHC: 33.7 g/dL (ref 30.0–36.0)
MCV: 91.7 fl (ref 78.0–100.0)
MONO ABS: 0.5 10*3/uL (ref 0.1–1.0)
Monocytes Relative: 6.6 % (ref 3.0–12.0)
NEUTROS ABS: 4.3 10*3/uL (ref 1.4–7.7)
NEUTROS PCT: 56.9 % (ref 43.0–77.0)
Platelets: 229 10*3/uL (ref 150.0–400.0)
RBC: 4.94 Mil/uL (ref 4.22–5.81)
RDW: 13.6 % (ref 11.5–14.6)
WBC: 7.5 10*3/uL (ref 4.5–10.5)

## 2014-01-22 LAB — COMPREHENSIVE METABOLIC PANEL
ALBUMIN: 4.4 g/dL (ref 3.5–5.2)
ALK PHOS: 68 U/L (ref 39–117)
ALT: 25 U/L (ref 0–53)
AST: 18 U/L (ref 0–37)
BUN: 13 mg/dL (ref 6–23)
CALCIUM: 9.7 mg/dL (ref 8.4–10.5)
CHLORIDE: 102 meq/L (ref 96–112)
CO2: 26 mEq/L (ref 19–32)
Creatinine, Ser: 0.9 mg/dL (ref 0.4–1.5)
GFR: 95.18 mL/min (ref >60.00)
Glucose, Bld: 94 mg/dL (ref 70–99)
POTASSIUM: 4.3 meq/L (ref 3.5–5.1)
Sodium: 138 mEq/L (ref 135–145)
Total Bilirubin: 0.8 mg/dL (ref 0.3–1.2)
Total Protein: 7.7 g/dL (ref 6.0–8.3)

## 2014-01-22 LAB — LIPID PANEL
CHOL/HDL RATIO: 4
Cholesterol: 230 mg/dL — ABNORMAL HIGH (ref 0–200)
HDL: 64 mg/dL (ref >39.00)
LDL CALC: 149 mg/dL — AB (ref 0–99)
Triglycerides: 85 mg/dL (ref 0.0–149.0)
VLDL: 17 mg/dL (ref 0.0–40.0)

## 2014-01-22 LAB — TSH: TSH: 0.45 u[IU]/mL (ref 0.35–5.50)

## 2014-01-22 LAB — PSA: PSA: 0.16 ng/mL (ref 0.10–4.00)

## 2014-01-22 NOTE — Patient Instructions (Signed)
Hypertension As your heart beats, it forces blood through your arteries. This force is your blood pressure. If the pressure is too high, it is called hypertension (HTN) or high blood pressure. HTN is dangerous because you may have it and not know it. High blood pressure may mean that your heart has to work harder to pump blood. Your arteries may be narrow or stiff. The extra work puts you at risk for heart disease, stroke, and other problems.  Blood pressure consists of two numbers, a higher number over a lower, 110/72, for example. It is stated as "110 over 72." The ideal is below 120 for the top number (systolic) and under 80 for the bottom (diastolic). Write down your blood pressure today. You should pay close attention to your blood pressure if you have certain conditions such as:  Heart failure.  Prior heart attack.  Diabetes  Chronic kidney disease.  Prior stroke.  Multiple risk factors for heart disease. To see if you have HTN, your blood pressure should be measured while you are seated with your arm held at the level of the heart. It should be measured at least twice. A one-time elevated blood pressure reading (especially in the Emergency Department) does not mean that you need treatment. There may be conditions in which the blood pressure is different between your right and left arms. It is important to see your caregiver soon for a recheck. Most people have essential hypertension which means that there is not a specific cause. This type of high blood pressure may be lowered by changing lifestyle factors such as:  Stress.  Smoking.  Lack of exercise.  Excessive weight.  Drug/tobacco/alcohol use.  Eating less salt. Most people do not have symptoms from high blood pressure until it has caused damage to the body. Effective treatment can often prevent, delay or reduce that damage. TREATMENT  When a cause has been identified, treatment for high blood pressure is directed at the  cause. There are a large number of medications to treat HTN. These fall into several categories, and your caregiver will help you select the medicines that are best for you. Medications may have side effects. You should review side effects with your caregiver. If your blood pressure stays high after you have made lifestyle changes or started on medicines,   Your medication(s) may need to be changed.  Other problems may need to be addressed.  Be certain you understand your prescriptions, and know how and when to take your medicine.  Be sure to follow up with your caregiver within the time frame advised (usually within two weeks) to have your blood pressure rechecked and to review your medications.  If you are taking more than one medicine to lower your blood pressure, make sure you know how and at what times they should be taken. Taking two medicines at the same time can result in blood pressure that is too low. SEEK IMMEDIATE MEDICAL CARE IF:  You develop a severe headache, blurred or changing vision, or confusion.  You have unusual weakness or numbness, or a faint feeling.  You have severe chest or abdominal pain, vomiting, or breathing problems. MAKE SURE YOU:   Understand these instructions.  Will watch your condition.  Will get help right away if you are not doing well or get worse. Document Released: 09/17/2005 Document Revised: 12/10/2011 Document Reviewed: 05/07/2008 [facility] Patient Information 2014 [place]. Health Maintenance, Males A healthy lifestyle and preventative care can promote health and wellness.  Maintain  regular health, dental, and eye exams.  Eat a healthy diet. Foods like vegetables, fruits, whole grains, low-fat dairy products, and lean protein foods contain the nutrients you need and are low in calories. Decrease your intake of foods high in solid fats, added sugars, and salt. Get information about a proper diet from your health care provider, if  necessary.  Regular physical exercise is one of the most important things you can do for your health. Most adults should get at least 150 minutes of moderate-intensity exercise (any activity that increases your heart rate and causes you to sweat) each week. In addition, most adults need muscle-strengthening exercises on 2 or more days a week.   Maintain a healthy weight. The body mass index (BMI) is a screening tool to identify possible weight problems. It provides an estimate of body fat based on height and weight. Your health care provider can find your BMI and can help you achieve or maintain a healthy weight. For males 20 years and older:  A BMI below 18.5 is considered underweight.  A BMI of 18.5 to 24.9 is normal.  A BMI of 25 to 29.9 is considered overweight.  A BMI of 30 and above is considered obese.  Maintain normal blood lipids and cholesterol by exercising and minimizing your intake of saturated fat. Eat a balanced diet with plenty of fruits and vegetables. Blood tests for lipids and cholesterol should begin at age 57 and be repeated every 5 years. If your lipid or cholesterol levels are high, you are over 50, or you are at high risk for heart disease, you may need your cholesterol levels checked more frequently.Ongoing high lipid and cholesterol levels should be treated with medicines, if diet and exercise are not working.  If you smoke, find out from your health care provider how to quit. If you do not use tobacco, do not start.  Lung cancer screening is recommended for adults aged 50 80 years who are at high risk for developing lung cancer because of a history of smoking. A yearly low-dose CT scan of the lungs is recommended for people who have at least a 30-pack-year history of smoking and are a current smoker or have quit within the past 15 years. A pack year of smoking is smoking an average of 1 pack of cigarettes a day for 1 year (for example, a 30-pack-year history of smoking  could mean smoking 1 pack a day for 30 years or 2 packs a day for 15 years). Yearly screening should continue until the smoker has stopped smoking for at least 15 years. Yearly screening should be stopped for people who develop a health problem that would prevent them from having lung cancer treatment.  If you choose to drink alcohol, do not have more than 2 drinks per day. One drink is considered to be 12 oz (360 mL) of beer, 5 oz (150 mL) of wine, or 1.5 oz (45 mL) of liquor.  Avoid use of street drugs. Do not share needles with anyone. Ask for help if you need support or instructions about stopping the use of drugs.  High blood pressure causes heart disease and increases the risk of stroke. Blood pressure should be checked at least every 1 2 years. Ongoing high blood pressure should be treated with medicines if weight loss and exercise are not effective.  If you are 44 56 years old, ask your health care provider if you should take aspirin to prevent heart disease.  Diabetes screening involves  taking a blood sample to check your fasting blood sugar level. This should be done once every 3 years after age 76, if you are at a normal weight and without risk factors for diabetes. Testing should be considered at a younger age or be carried out more frequently if you are overweight and have at least 1 risk factor for diabetes.  Colorectal cancer can be detected and often prevented. Most routine colorectal cancer screening begins at the age of 45 and continues through age 54. However, your health care provider may recommend screening at an earlier age if you have risk factors for colon cancer. On a yearly basis, your health care provider may provide home test kits to check for hidden blood in the stool. A small camera at the end of a tube may be used to directly examine the colon (sigmoidoscopy or colonoscopy) to detect the earliest forms of colorectal cancer. Talk to your health care provider about this at  age 88, when routine screening begins. A direct exam of the colon should be repeated every 5 10 years through age 63, unless early forms of pre-cancerous polyps or small growths are found.  People who are at an increased risk for hepatitis B should be screened for this virus. You are considered at high risk for hepatitis B if:  You were born in a country where hepatitis B occurs often. Talk with your health care provider about which countries are considered high-risk.  Your parents were born in a high-risk country and you have not received a shot to protect against hepatitis B (hepatitis B vaccine).  You have HIV or AIDS.  You use needles to inject street drugs.  You live with, or have sex with, someone who has hepatitis B.  You are a man who has sex with other men (MSM).  You get hemodialysis treatment.  You take certain medicines for conditions like cancer, organ transplantation, and autoimmune conditions.  Hepatitis C blood testing is recommended for all people born from [birth year] through 1963 and any individual with known risk factors for hepatitis C.  Healthy men should no longer receive prostate-specific antigen (PSA) blood tests as part of routine cancer screening. Talk to your health care provider about prostate cancer screening.  Testicular cancer screening is not recommended for adolescents or adult males who have no symptoms. Screening includes self-exam, a health care provider exam, and other screening tests. Consult with your health care provider about any symptoms you have or any concerns you have about testicular cancer.  Practice safe sex. Use condoms and avoid high-risk sexual practices to reduce the spread of sexually transmitted infections (STIs).  Use sunscreen. Apply sunscreen liberally and repeatedly throughout the day. You should seek shade when your shadow is shorter than you. Protect yourself by wearing long sleeves, pants, a wide-brimmed hat, and sunglasses year  round, whenever you are outdoors.  Tell your health care provider of new moles or changes in moles, especially if there is a change in shape or color. Also tell your provider if a mole is larger than the size of a pencil eraser.  A one-time screening for abdominal aortic aneurysm (AAA) and surgical repair of large AAAs by ultrasound is recommended for men aged 64 75 years who are current or former smokers.  Stay current with your vaccines (immunizations). Document Released: 03/15/2008 Document Revised: 07/08/2013 Document Reviewed: 02/12/2011 [facility] Patient Information 2014 [place], [place].

## 2014-01-22 NOTE — Progress Notes (Signed)
Subjective:    [name], male    DOB: 07-05-1956, 58 y.o.   MRN: [id number]  Hypertension This is a chronic problem. The current episode started more than 1 year ago. The problem has been gradually improving since onset. The problem is controlled. Pertinent negatives include no anxiety, blurred vision, chest pain, headaches, malaise/fatigue, neck pain, orthopnea, palpitations, peripheral edema, PND, shortness of breath or sweats. There are no associated agents to hypertension. Past treatments include alpha 1 blockers, ACE inhibitors and calcium channel blockers. The current treatment provides moderate improvement. Compliance problems include exercise and diet.       Review of Systems  Constitutional: Negative.  Negative for fever, chills, malaise/fatigue, diaphoresis, appetite change and fatigue.  HENT: Negative.   Eyes: Negative.  Negative for blurred vision.  Respiratory: Negative.  Negative for cough, choking, chest tightness, shortness of breath, wheezing and stridor.   Cardiovascular: Negative.  Negative for chest pain, palpitations, orthopnea, leg swelling and PND.  Gastrointestinal: Negative.  Negative for nausea, vomiting, abdominal pain, diarrhea and constipation.  Endocrine: Negative.   Genitourinary: Negative.  Negative for dysuria, urgency, frequency, hematuria, flank pain, decreased urine volume and difficulty urinating.  Musculoskeletal: Negative.  Negative for arthralgias, back pain, myalgias, neck pain and neck stiffness.  Skin: Negative.   Allergic/Immunologic: Negative.   Neurological: Negative.  Negative for dizziness, tremors, weakness and headaches.  Hematological: Negative.  Negative for adenopathy. Does not bruise/bleed easily.  Psychiatric/Behavioral: Negative.        Objective:   Physical Exam  Vitals reviewed. Constitutional: He is oriented to person, place, and time. He appears well-developed and well-nourished. No distress.  HENT:  Head:  Normocephalic and atraumatic.  Mouth/Throat: Oropharynx is clear and moist. No oropharyngeal exudate.  Eyes: Conjunctivae are normal. Right eye exhibits no discharge. Left eye exhibits no discharge. No scleral icterus.  Neck: Normal range of motion. Neck supple. No JVD present. No tracheal deviation present. No thyromegaly present.  Cardiovascular: Normal rate, regular rhythm, normal heart sounds and intact distal pulses.  Exam reveals no gallop and no friction rub.   No murmur heard. Pulmonary/Chest: Effort normal and breath sounds normal. No stridor. No respiratory distress. He has no wheezes. He has no rales. He exhibits no tenderness.  Abdominal: Soft. Bowel sounds are normal. He exhibits no distension and no mass. There is no tenderness. There is no rebound and no guarding. Hernia confirmed negative in the right inguinal area and confirmed negative in the left inguinal area.  Genitourinary: Rectum normal, testes normal and penis normal. Rectal exam shows no external hemorrhoid, no internal hemorrhoid, no fissure, no mass, no tenderness and anal tone normal. Guaiac negative stool. Prostate is enlarged (1+ smooth symm BPH). Prostate is not tender. Right testis shows no mass, no swelling and no tenderness. Right testis is descended. Left testis shows no mass, no swelling and no tenderness. Left testis is descended. Circumcised. No penile erythema or penile tenderness. No discharge found.  Musculoskeletal: Normal range of motion. He exhibits no edema and no tenderness.  Lymphadenopathy:    He has no cervical adenopathy.       Right: No inguinal adenopathy present.       Left: No inguinal adenopathy present.  Neurological: He is oriented to person, place, and time.  Skin: Skin is warm and dry. No rash noted. He is not diaphoretic. No erythema. No pallor.  Psychiatric: He has a normal mood and affect. His behavior is normal. Judgment and thought content normal.  Assessment & Plan:

## 2014-01-23 ENCOUNTER — Encounter: Payer: Self-pay | Admitting: Internal Medicine

## 2014-01-23 DIAGNOSIS — R319 Hematuria, unspecified: Secondary | ICD-10-CM | POA: Insufficient documentation

## 2014-01-23 NOTE — Assessment & Plan Note (Signed)
He has achieved his LDL goal 

## 2014-01-23 NOTE — Assessment & Plan Note (Signed)
His BP is well controlled 

## 2014-01-23 NOTE — Assessment & Plan Note (Signed)
There were a few RBC's on his UA, I have asked him to return for a recheck

## 2014-01-23 NOTE — Assessment & Plan Note (Signed)
I will check his A1C to see if he has developed DM2 

## 2014-02-19 ENCOUNTER — Other Ambulatory Visit (INDEPENDENT_AMBULATORY_CARE_PROVIDER_SITE_OTHER): Payer: Managed Care, Other (non HMO)

## 2014-02-19 ENCOUNTER — Encounter: Payer: Self-pay | Admitting: Internal Medicine

## 2014-02-19 ENCOUNTER — Ambulatory Visit (INDEPENDENT_AMBULATORY_CARE_PROVIDER_SITE_OTHER): Payer: Managed Care, Other (non HMO) | Admitting: Internal Medicine

## 2014-02-19 VITALS — BP 130/72 | HR 72 | Temp 97.8°F | Resp 16 | Ht 66.0 in | Wt 184.2 lb

## 2014-02-19 DIAGNOSIS — I10 Essential (primary) hypertension: Secondary | ICD-10-CM

## 2014-02-19 DIAGNOSIS — R319 Hematuria, unspecified: Secondary | ICD-10-CM

## 2014-02-19 LAB — URINALYSIS, ROUTINE W REFLEX MICROSCOPIC
Bilirubin Urine: NEGATIVE
Hgb urine dipstick: NEGATIVE
KETONES UR: NEGATIVE
Leukocytes, UA: NEGATIVE
Nitrite: NEGATIVE
PH: 7.5 (ref 5.0–8.0)
Specific Gravity, Urine: 1.01 (ref 1.000–1.030)
TOTAL PROTEIN, URINE-UPE24: NEGATIVE
URINE GLUCOSE: NEGATIVE
Urobilinogen, UA: 0.2 (ref 0.0–1.0)

## 2014-02-19 NOTE — Progress Notes (Signed)
Pre visit review using our clinic review tool, if applicable. No additional management support is needed unless otherwise documented below in the visit note. 

## 2014-02-19 NOTE — Patient Instructions (Signed)
Hematuria, Adult °Hematuria is blood in your urine. It can be caused by a bladder infection, kidney infection, prostate infection, kidney stone, or cancer of your urinary tract. Infections can usually be treated with medicine, and a kidney stone usually will pass through your urine. If neither of these is the cause of your hematuria, further workup to find out the reason may be needed. °It is very important that you tell your health care provider about any blood you see in your urine, even if the blood stops without treatment or happens without causing pain. Blood in your urine that happens and then stops and then happens again can be a symptom of a very serious condition. Also, pain is not a symptom in the initial stages of many urinary cancers. °HOME CARE INSTRUCTIONS  °· Drink lots of fluid, 3 4 quarts a day. If you have been diagnosed with an infection, cranberry juice is especially recommended, in addition to large amounts of water. °· Avoid caffeine, tea, and carbonated beverages, because they tend to irritate the bladder. °· Avoid alcohol because it may irritate the prostate. °· Only take over-the-counter or prescription medicines for pain, discomfort, or fever as directed by your health care provider. °· If you have been diagnosed with a kidney stone, follow your health care provider's instructions regarding straining your urine to catch the stone. °· Empty your bladder often. Avoid holding urine for long periods of time. °· After a bowel movement, women should cleanse front to back. Use each tissue only once. °· Empty your bladder before and after sexual intercourse if you are a male. °SEEK MEDICAL CARE IF: °You develop back pain, fever, a feeling of sickness in your stomach (nausea), or vomiting or if your symptoms are not better in 3 days. Return sooner if you are getting worse. °SEEK IMMEDIATE MEDICAL CARE IF:  °· You have a persistent fever, with a temperature of 101.8°F (38.8°C) or greater. °· You  develop severe vomiting and are unable to keep the medicine down. °· You develop severe back or abdominal pain despite taking your medicines. °· You begin passing a large amount of blood or clots in your urine. °· You feel extremely weak or faint, or you pass out. °MAKE SURE YOU:  °· Understand these instructions. °· Will watch your condition. °· Will get help right away if you are not doing well or get worse. °Document Released: 09/17/2005 Document Revised: 07/08/2013 Document Reviewed: 05/18/2013 °[employer]® Patient Information ©2014 [employer]. ° °

## 2014-02-21 ENCOUNTER — Encounter: Payer: Self-pay | Admitting: Internal Medicine

## 2014-02-21 NOTE — Assessment & Plan Note (Signed)
His BP is well controlled 

## 2014-02-21 NOTE — Progress Notes (Signed)
   Subjective:    [name], male    DOB: Nov 22, 1955, 58 y.o.   MRN: [id number]  Hematuria This is a new problem. The current episode started 1 to 4 weeks ago. The problem has been resolved since onset. He describes the hematuria as microscopic hematuria. He reports no clotting in his urine stream. His pain is at a severity of 0/10. He is experiencing no pain. Irritative symptoms do not include frequency, nocturia or urgency. Obstructive symptoms do not include dribbling, incomplete emptying, an intermittent stream, a slower stream, straining or a weak stream. Pertinent negatives include no abdominal pain, chills, dysuria, facial swelling, fever, flank pain, genital pain, hesitancy, inability to urinate, nausea or vomiting. He is sexually active.      Review of Systems  Constitutional: Positive for fatigue. Negative for fever, chills and diaphoresis.  HENT: Negative.  Negative for facial swelling.   Eyes: Negative.   Respiratory: Negative.   Cardiovascular: Negative.  Negative for chest pain, palpitations and leg swelling.  Gastrointestinal: Negative.  Negative for nausea, vomiting, abdominal pain, diarrhea and constipation.  Endocrine: Negative.   Genitourinary: Positive for hematuria. Negative for dysuria, hesitancy, urgency, frequency, flank pain, decreased urine volume, discharge, penile swelling, scrotal swelling, enuresis, difficulty urinating, penile pain, testicular pain, incomplete emptying and nocturia.  Musculoskeletal: Negative.  Negative for arthralgias and myalgias.  Skin: Negative.   Allergic/Immunologic: Negative.   Neurological: Negative.  Negative for dizziness, tremors, weakness, light-headedness and headaches.  Hematological: Negative.  Negative for adenopathy. Does not bruise/bleed easily.  Psychiatric/Behavioral: Negative.        Objective:   Physical Exam  Vitals reviewed. Constitutional: He is oriented to person, place, and time. He appears  well-developed and well-nourished. No distress.  HENT:  Head: Normocephalic and atraumatic.  Mouth/Throat: Oropharynx is clear and moist. No oropharyngeal exudate.  Eyes: Conjunctivae are normal. Right eye exhibits no discharge. Left eye exhibits no discharge. No scleral icterus.  Neck: Normal range of motion. Neck supple. No JVD present. No tracheal deviation present. No thyromegaly present.  Cardiovascular: Normal rate, regular rhythm, normal heart sounds and intact distal pulses.  Exam reveals no gallop and no friction rub.   No murmur heard. Pulmonary/Chest: Effort normal and breath sounds normal. No stridor. No respiratory distress. He has no wheezes. He has no rales. He exhibits no tenderness.  Abdominal: Soft. Bowel sounds are normal. He exhibits no distension and no mass. There is no hepatosplenomegaly. There is no tenderness. There is no rebound, no guarding and no CVA tenderness.  Musculoskeletal: Normal range of motion. He exhibits no edema and no tenderness.  Lymphadenopathy:    He has no cervical adenopathy.  Neurological: He is oriented to person, place, and time.  Skin: Skin is warm and dry. No rash noted. He is not diaphoretic. No erythema. No pallor.  Psychiatric: He has a normal mood and affect. His behavior is normal. Judgment and thought content normal.          Assessment & Plan:

## 2014-02-21 NOTE — Assessment & Plan Note (Signed)
Repeat testing today is negative for blood Will follow for now

## 2014-04-25 ENCOUNTER — Other Ambulatory Visit: Payer: Self-pay | Admitting: Internal Medicine

## 2014-06-25 ENCOUNTER — Other Ambulatory Visit: Payer: Self-pay

## 2014-06-25 MED ORDER — FINASTERIDE 5 MG PO TABS: ORAL_TABLET | ORAL | Status: DC

## 2014-08-04 ENCOUNTER — Other Ambulatory Visit: Payer: Self-pay | Admitting: Internal Medicine

## 2014-11-03 ENCOUNTER — Other Ambulatory Visit: Payer: Self-pay | Admitting: Internal Medicine

## 2014-11-08 ENCOUNTER — Other Ambulatory Visit: Payer: Self-pay | Admitting: Internal Medicine

## 2015-01-31 ENCOUNTER — Ambulatory Visit (INDEPENDENT_AMBULATORY_CARE_PROVIDER_SITE_OTHER): Payer: Managed Care, Other (non HMO) | Admitting: Family Medicine

## 2015-01-31 VITALS — BP 132/68 | HR 82 | Temp 98.1°F | Resp 18 | Ht 66.5 in | Wt 195.0 lb

## 2015-01-31 DIAGNOSIS — M79672 Pain in left foot: Secondary | ICD-10-CM | POA: Diagnosis not present

## 2015-01-31 DIAGNOSIS — M79671 Pain in right foot: Secondary | ICD-10-CM

## 2015-01-31 NOTE — Patient Instructions (Signed)
Our office will call you with a podiatry appointment.

## 2015-01-31 NOTE — Progress Notes (Signed)
   Subjective:    [name], male    DOB: [birth year], 59 y.o.   MRN: [id number]  HPI This is a very pleasant 59 yo male who is accompanied by his wife. He presents today with a growth between his 4-5 right toes. Has noticed for several months. Is having increasing pain. Has always had thick callouses on both feet. Saw podiatry about 10 years ago and was told to cut off excess himself. When he removes the skin, it comes back worse. Is interfering with his ability to work and wear his shoes.   Sees Dr. Ronnald Ramp for primary care, is due to have annual exam soon. Reports compliance with medications.   Past Medical History  Diagnosis Date  . Allergy   . GERD (gastroesophageal reflux disease)   . Hyperlipidemia   . Hypertension   . History of nephrolithiasis   . Arthritis    Past Surgical History  Procedure Laterality Date  . Tooth extraction    . Kidney stone surgery     Family History  Problem Relation Age of Onset  . Arthritis Other   . Diabetes Other   . Hyperlipidemia Other   . Hypertension Other   . Early death Neg Hx   . COPD Neg Hx   . Cancer Neg Hx   . Asthma Neg Hx   . Alcohol abuse Neg Hx   . Heart disease Neg Hx   . Stroke Neg Hx   . Kidney disease Neg Hx   . Parkinson's disease Sister    History  Substance Use Topics  . Smoking status: Former Smoker -- 1.00 packs/day for 15 years    Types: Cigarettes  . Smokeless tobacco: Former Systems developer    Types: [place] date: 10/01/1978     Comment: occ smokes 1 cigar per year.  . Alcohol Use: 1.2 oz/week    2 Cans of beer per week    Review of Systems No chest pain, no SOB, no abdominal pain, no nausea, no vomiting.     Objective:   Physical Exam  Constitutional: He is oriented to person, place, and time. He appears well-developed and well-nourished.  HENT:  Head: Normocephalic and atraumatic.  Neck: Normal range of motion. Neck supple.  Cardiovascular: Normal rate, regular rhythm and normal heart  sounds.   Pulmonary/Chest: Effort normal and breath sounds normal.  Musculoskeletal: He exhibits no edema.  Neurological: He is alert and oriented to person, place, and time.  Skin: Skin is warm and dry.  Bilateral soles of feet with very thickened callous at heels and under toes. Right foot with thickened area between 4th-5th toes. Small amount of callous removed with #10 scalpel and patient reports relief of pain/pressure.   Psychiatric: He has a normal mood and affect. His behavior is normal. Judgment and thought content normal.  Vitals reviewed. BP 132/68 mmHg  Pulse 82  Temp(Src) 98.1 F (36.7 C) (Oral)  Resp 18  Ht 5' 6.5" (1.689 m)  Wt 195 lb (88.451 kg)  BMI 31.01 kg/m2  SpO2 97%      Assessment & Plan:  Discussed with Dr. Everlene Farrier who examined patient 1. Bilateral foot pain - This is longstanding problem for patient with it reaching an acute phase with pain.  - Ambulatory referral to [facility], FNP-BC  [facility]  01/31/2015 1:09 PM

## 2015-02-11 ENCOUNTER — Ambulatory Visit (INDEPENDENT_AMBULATORY_CARE_PROVIDER_SITE_OTHER): Payer: Managed Care, Other (non HMO)

## 2015-02-11 ENCOUNTER — Encounter: Payer: Self-pay | Admitting: Podiatry

## 2015-02-11 ENCOUNTER — Ambulatory Visit (INDEPENDENT_AMBULATORY_CARE_PROVIDER_SITE_OTHER): Payer: Managed Care, Other (non HMO) | Admitting: Podiatry

## 2015-02-11 VITALS — BP 151/89 | HR 75 | Resp 15

## 2015-02-11 DIAGNOSIS — M79672 Pain in left foot: Secondary | ICD-10-CM

## 2015-02-11 DIAGNOSIS — M79671 Pain in right foot: Secondary | ICD-10-CM

## 2015-02-11 DIAGNOSIS — M2041 Other hammer toe(s) (acquired), right foot: Secondary | ICD-10-CM

## 2015-02-11 DIAGNOSIS — D169 Benign neoplasm of bone and articular cartilage, unspecified: Secondary | ICD-10-CM

## 2015-02-11 DIAGNOSIS — L84 Corns and callosities: Secondary | ICD-10-CM

## 2015-02-11 NOTE — Patient Instructions (Signed)
Pre-Operative Instructions  Congratulations, you have decided to take an important step to improving your quality of life.  You can be assured that the doctors of [facility] will be with you every step of the way.  1. Plan to be at the surgery center/hospital at least 1 (one) hour prior to your scheduled time unless otherwise directed by the surgical center/hospital staff.  You must have a responsible adult accompany you, remain during the surgery and drive you home.  Make sure you have directions to the surgical center/hospital and know how to get there on time. 2. For hospital based surgery you will need to obtain a history and physical form from your family physician within 1 month prior to the date of surgery- we will give you a form for you primary physician.  3. We make every effort to accommodate the date you request for surgery.  There are however, times where surgery dates or times have to be moved.  We will contact you as soon as possible if a change in schedule is required.   4. No Aspirin/Ibuprofen for one week before surgery.  If you are on aspirin, any non-steroidal anti-inflammatory medications (Mobic, Aleve, Ibuprofen) you should stop taking it 7 days prior to your surgery.  You make take Tylenol  For pain prior to surgery.  5. Medications- If you are taking daily heart and blood pressure medications, seizure, reflux, allergy, asthma, anxiety, pain or diabetes medications, make sure the surgery center/hospital is aware before the day of surgery so they may notify you which medications to take or avoid the day of surgery. 6. No food or drink after midnight the night before surgery unless directed otherwise by surgical center/hospital staff. 7. No alcoholic beverages 24 hours prior to surgery.  No smoking 24 hours prior to or 24 hours after surgery. 8. Wear loose pants or shorts- loose enough to fit over bandages, boots, and casts. 9. No slip on shoes, sneakers are best. 10. Bring  your boot with you to the surgery center/hospital.  Also bring crutches or a walker if your physician has prescribed it for you.  If you do not have this equipment, it will be provided for you after surgery. 11. If you have not been contracted by the surgery center/hospital by the day before your surgery, call to confirm the date and time of your surgery. 12. Leave-time from work may vary depending on the type of surgery you have.  Appropriate arrangements should be made prior to surgery with your employer. 13. Prescriptions will be provided immediately following surgery by your doctor.  Have these filled as soon as possible after surgery and take the medication as directed. 14. Remove nail polish on the operative foot. 15. Wash the night before surgery.  The night before surgery wash the foot and leg well with the antibacterial soap provided and water paying special attention to beneath the toenails and in between the toes.  Rinse thoroughly with water and dry well with a towel.  Perform this wash unless told not to do so by your physician.  Enclosed: 1 Ice pack (please put in freezer the night before surgery)   1 Hibiclens skin cleaner   Pre-op Instructions  If you have any questions regarding the instructions, do not hesitate to call our office.  [place]: [address] [place], [place] [address] [phone]  [place]: [address] [place], [place] [address] [phone]  [place]: [address]  [place], [place] [address] [phone]  Dr. Richard   Tuchman DPM, Dr. Norman Regal DPM Dr. Richard Sikora DPM, Dr. M. Todd Hyatt DPM, Dr. Kathryn Egerton DPM 

## 2015-02-11 NOTE — Progress Notes (Signed)
   Subjective:    [name], male    DOB: 1956/09/17, 59 y.o.   MRN: [id number]  HPI Pt presents with bilateral thickened calluses that are painful   Review of Systems  All other systems reviewed and are negative.      Objective:   Physical Exam        Assessment & Plan:

## 2015-02-13 NOTE — Progress Notes (Signed)
Subjective:     [name], male   DOB: 1956/03/20, 59 y.o.   MRN: [id number]  HPI patient states that I'm having painful calluses on the bottom of both feet that make walking difficult   Review of Systems  All other systems reviewed and are negative.      Objective:   Physical Exam  Constitutional: He is oriented to person, place, and time.  Cardiovascular: Intact distal pulses.   Musculoskeletal: Normal range of motion.  Neurological: He is oriented to person, place, and time.  Skin: Skin is warm.  Nursing note and vitals reviewed.  neurovascular status intact with muscle strength adequate range of motion within normal limits. Patient's noted to have plantar flexed fifth metatarsal bilateral with deep keratotic lesion that are painful when pressed and making walking difficult. 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 Debridement of lesions accomplished today with no iatrogenic bleeding and advised on thicker bottom shoes and not going barefoot. Reappoint if symptoms persist

## 2015-03-10 ENCOUNTER — Encounter: Payer: Self-pay | Admitting: Internal Medicine

## 2015-03-10 ENCOUNTER — Other Ambulatory Visit (INDEPENDENT_AMBULATORY_CARE_PROVIDER_SITE_OTHER): Payer: Managed Care, Other (non HMO)

## 2015-03-10 ENCOUNTER — Ambulatory Visit (INDEPENDENT_AMBULATORY_CARE_PROVIDER_SITE_OTHER): Payer: Managed Care, Other (non HMO) | Admitting: Internal Medicine

## 2015-03-10 VITALS — BP 118/70 | HR 65 | Temp 98.5°F | Resp 16 | Ht 66.5 in | Wt 194.0 lb

## 2015-03-10 DIAGNOSIS — E785 Hyperlipidemia, unspecified: Secondary | ICD-10-CM

## 2015-03-10 DIAGNOSIS — I10 Essential (primary) hypertension: Secondary | ICD-10-CM

## 2015-03-10 DIAGNOSIS — Z0000 Encounter for general adult medical examination without abnormal findings: Secondary | ICD-10-CM

## 2015-03-10 LAB — COMPREHENSIVE METABOLIC PANEL
ALBUMIN: 4.4 g/dL (ref 3.5–5.2)
ALK PHOS: 74 U/L (ref 39–117)
ALT: 22 U/L (ref 0–53)
AST: 17 U/L (ref 0–37)
BUN: 15 mg/dL (ref 6–23)
CO2: 27 mEq/L (ref 19–32)
Calcium: 9.3 mg/dL (ref 8.4–10.5)
Chloride: 102 mEq/L (ref 96–112)
Creatinine, Ser: 0.86 mg/dL (ref 0.40–1.50)
GFR: 97.35 mL/min (ref >60.00)
Glucose, Bld: 102 mg/dL — ABNORMAL HIGH (ref 70–99)
Potassium: 3.7 mEq/L (ref 3.5–5.1)
SODIUM: 136 meq/L (ref 135–145)
TOTAL PROTEIN: 7.7 g/dL (ref 6.0–8.3)
Total Bilirubin: 0.4 mg/dL (ref 0.2–1.2)

## 2015-03-10 LAB — LIPID PANEL
CHOL/HDL RATIO: 4
Cholesterol: 203 mg/dL — ABNORMAL HIGH (ref 0–200)
HDL: 56.6 mg/dL (ref >39.00)
LDL Cholesterol: 113 mg/dL — ABNORMAL HIGH (ref 0–99)
NonHDL: 146.4
TRIGLYCERIDES: 167 mg/dL — AB (ref 0.0–149.0)
VLDL: 33.4 mg/dL (ref 0.0–40.0)

## 2015-03-10 LAB — URINALYSIS, ROUTINE W REFLEX MICROSCOPIC
BILIRUBIN URINE: NEGATIVE
HGB URINE DIPSTICK: NEGATIVE
Ketones, ur: NEGATIVE
Leukocytes, UA: NEGATIVE
Nitrite: NEGATIVE
PH: 6.5 (ref 5.0–8.0)
RBC / HPF: NONE SEEN (ref 0–?)
SPECIFIC GRAVITY, URINE: 1.01 (ref 1.000–1.030)
Total Protein, Urine: NEGATIVE
Urine Glucose: NEGATIVE
Urobilinogen, UA: 0.2 (ref 0.0–1.0)
WBC, UA: NONE SEEN (ref 0–?)

## 2015-03-10 LAB — CBC WITH DIFFERENTIAL/PLATELET
BASOS PCT: 0.6 % (ref 0.0–3.0)
Basophils Absolute: 0 10*3/uL (ref 0.0–0.1)
EOS ABS: 0.1 10*3/uL (ref 0.0–0.7)
Eosinophils Relative: 1.4 % (ref 0.0–5.0)
HCT: 44.4 % (ref 39.0–52.0)
HEMOGLOBIN: 15.3 g/dL (ref 13.0–17.0)
Lymphocytes Relative: 32.8 % (ref 12.0–46.0)
Lymphs Abs: 2.2 10*3/uL (ref 0.7–4.0)
MCHC: 34.5 g/dL (ref 30.0–36.0)
MCV: 89.4 fl (ref 78.0–100.0)
MONO ABS: 0.5 10*3/uL (ref 0.1–1.0)
MONOS PCT: 7.1 % (ref 3.0–12.0)
NEUTROS PCT: 58.1 % (ref 43.0–77.0)
Neutro Abs: 3.9 10*3/uL (ref 1.4–7.7)
PLATELETS: 220 10*3/uL (ref 150.0–400.0)
RBC: 4.97 Mil/uL (ref 4.22–5.81)
RDW: 13.1 % (ref 11.5–15.5)
WBC: 6.7 10*3/uL (ref 4.0–10.5)

## 2015-03-10 LAB — PSA: PSA: 0.16 ng/mL (ref 0.10–4.00)

## 2015-03-10 LAB — TSH: TSH: 0.55 u[IU]/mL (ref 0.35–4.50)

## 2015-03-10 LAB — FECAL OCCULT BLOOD, GUAIAC: Fecal Occult Blood: NEGATIVE

## 2015-03-10 MED ORDER — AMLODIPINE BESY-BENAZEPRIL HCL 10-20 MG PO CAPS: 1.0000 | ORAL_CAPSULE | Freq: Every day | ORAL | Status: DC

## 2015-03-10 NOTE — Progress Notes (Signed)
Pre visit review using our clinic review tool, if applicable. No additional management support is needed unless otherwise documented below in the visit note. 

## 2015-03-10 NOTE — Patient Instructions (Signed)

## 2015-03-10 NOTE — Assessment & Plan Note (Signed)
His BP is well controlled Lytes and renal function are stable 

## 2015-03-10 NOTE — Progress Notes (Signed)
Subjective:  [name], male    DOB: March 28, 1956  Age: 59 y.o. MRN: [id number]  CC: Hypertension and Annual Exam   HPI Steven Hickman presents for a CPX and a BP check. He feels well and offers no complaints.  Outpatient Prescriptions Prior to Visit  Medication Sig Dispense Refill  . aspirin 81 MG tablet Take 81 mg by mouth daily.      Marland Kitchen doxazosin (CARDURA) 2 MG tablet TAKE ONE TABLET BY MOUTH ONCE DAILY 90 tablet 2  . finasteride (PROSCAR) 5 MG tablet TAKE 1 TABLET DAILY 90 tablet 3  . fluticasone (FLONASE) 50 MCG/ACT nasal spray Place 1 spray into both nostrils daily.    . folic acid (FOLVITE) 782 MCG tablet Take 400 mcg by mouth daily.     . Garlic 9562 MG CAPS Take by mouth daily.     . Multiple Vitamin (MULTIVITAMIN) tablet Take 1 tablet by mouth daily.     . Omega-3 Fatty Acids (FISH OIL) 1000 MG CAPS Take 1 capsule by mouth daily.    . vitamin E 400 UNIT capsule Take 400 Units by mouth daily.     Marland Kitchen amLODipine-benazepril (LOTREL) 10-20 MG per capsule TAKE ONE CAPSULE BY MOUTH EVERY DAY 90 capsule 1  . omeprazole (PRILOSEC) 40 MG capsule TAKE 1 CAPSULE DAILY (Patient not taking: Reported on 03/10/2015) 90 capsule 3   No facility-administered medications prior to visit.    ROS Review of Systems  Constitutional: Negative.  Negative for diaphoresis and fatigue.  HENT: Negative.   Eyes: Negative.   Respiratory: Negative.  Negative for apnea, cough, choking, chest tightness, shortness of breath, wheezing and stridor.   Cardiovascular: Negative.  Negative for chest pain, palpitations and leg swelling.  Gastrointestinal: Negative.  Negative for nausea, vomiting, abdominal pain, diarrhea, constipation and blood in stool.  Endocrine: Negative.   Genitourinary: Negative.  Negative for difficulty urinating and testicular pain.  Musculoskeletal: Negative.   Skin: Negative.  Negative for rash.  Allergic/Immunologic: Negative.   Neurological: Negative.  Negative for dizziness,  tremors, speech difficulty, weakness, light-headedness and headaches.  Hematological: Negative.  Negative for adenopathy. Does not bruise/bleed easily.  Psychiatric/Behavioral: Negative.     Objective:  BP 118/70 mmHg  Pulse 65  Temp(Src) 98.5 F (36.9 C) (Oral)  Resp 16  Ht 5' 6.5" (1.689 m)  Wt 194 lb (87.998 kg)  BMI 30.85 kg/m2  SpO2 97%  BP Readings from Last 3 Encounters:  03/10/15 118/70  02/11/15 151/89  01/31/15 132/68    Wt Readings from Last 3 Encounters:  03/10/15 194 lb (87.998 kg)  01/31/15 195 lb (88.451 kg)  02/19/14 184 lb 4 oz (83.575 kg)    Physical Exam  Constitutional: He is oriented to person, place, and time. He appears well-developed and well-nourished. No distress.  HENT:  Head: Normocephalic and atraumatic.  Mouth/Throat: Oropharynx is clear and moist. No oropharyngeal exudate.  Eyes: Conjunctivae are normal. Right eye exhibits no discharge. Left eye exhibits no discharge. No scleral icterus.  Neck: Normal range of motion. Neck supple. No JVD present. No tracheal deviation present. No thyromegaly present.  Cardiovascular: Normal rate, regular rhythm, normal heart sounds and intact distal pulses.  Exam reveals no gallop and no friction rub.   No murmur heard. Pulmonary/Chest: Effort normal and breath sounds normal. No stridor. No respiratory distress. He has no wheezes. He has no rales. He exhibits no tenderness.  Abdominal: Soft. Bowel sounds are normal. He exhibits no distension and no mass.  There is no tenderness. There is no rebound and no guarding. Hernia confirmed negative in the right inguinal area and confirmed negative in the left inguinal area.  Genitourinary: Rectum normal, testes normal and penis normal. Rectal exam shows no external hemorrhoid, no internal hemorrhoid, no fissure, no mass, no tenderness and anal tone normal. Guaiac negative stool. Prostate is enlarged (1+ smooth symm BPH). Prostate is not tender. Right testis shows no mass,  no swelling and no tenderness. Right testis is descended. Left testis shows no mass, no swelling and no tenderness. Left testis is descended. Circumcised. No penile erythema or penile tenderness. No discharge found.  Musculoskeletal: Normal range of motion. He exhibits no edema or tenderness.  Lymphadenopathy:    He has no cervical adenopathy.       Right: No inguinal adenopathy present.       Left: No inguinal adenopathy present.  Neurological: He is oriented to person, place, and time.  Skin: Skin is warm and dry. No rash noted. He is not diaphoretic. No erythema. No pallor.  Psychiatric: He has a normal mood and affect. His behavior is normal. Judgment and thought content normal.  Vitals reviewed.   Lab Results  Component Value Date   WBC 6.7 03/10/2015   HGB 15.3 03/10/2015   HCT 44.4 03/10/2015   PLT 220.0 03/10/2015   GLUCOSE 102* 03/10/2015   CHOL 203* 03/10/2015   TRIG 167.0* 03/10/2015   HDL 56.60 03/10/2015   LDLDIRECT 121.6 12/03/2011   LDLCALC 113* 03/10/2015   ALT 22 03/10/2015   AST 17 03/10/2015   NA 136 03/10/2015   K 3.7 03/10/2015   CL 102 03/10/2015   CREATININE 0.86 03/10/2015   BUN 15 03/10/2015   CO2 27 03/10/2015   TSH 0.55 03/10/2015   PSA 0.16 03/10/2015   HGBA1C 5.1 01/22/2014    Dg Lumbar Spine Complete  05/22/2013   CLINICAL DATA:  59 year old male with left-sided low back pain. Lifting injury.  EXAM: LUMBAR SPINE - COMPLETE 4+ VIEW  COMPARISON:  Abdominal radiographs 05/07/2010. CT Abdomen and Pelvis 08/20/2006.  FINDINGS: Normal lumbar segmentation. Stable vertebral height and alignment. Mild progression of degenerative endplate spurring since 01-09-06, most pronounced at L2-L3. Still, disc spaces are relatively preserved. No pars fracture. Mild L5-S1 facet hypertrophy. Lower thoracic levels appear intact. Sacral a ala and SI joints within normal limits.  IMPRESSION: No acute osseous abnormality in the lumbar spine.   Electronically Signed   By: Steven Hickman   On: 05/22/2013 10:15    Assessment & Plan:   Steven Hickman was seen today for hypertension and annual exam.  Diagnoses and all orders for this visit:  Routine general medical examination at a health care facility - exam done, labs reviewed, vaccines were reviewed, pt ed material was given Orders: -     Lipid panel; Future -     Comprehensive metabolic panel; Future -     CBC with Differential/Platelet; Future -     PSA; Future -     Urinalysis, Routine w reflex microscopic (not at [facility]); Future -     TSH; Future  Hyperlipidemia with target LDL less than 160 - Framingham risk score is only 6% so I don't recommend a statin   I have discontinued Mr. Sortino omeprazole. I have also changed his amLODipine-benazepril. Additionally, I am having him maintain his aspirin, multivitamin, folic acid, Garlic, vitamin E, Fish Oil, finasteride, doxazosin, and fluticasone.  Meds ordered this encounter  Medications  . amLODipine-benazepril (LOTREL)  10-20 MG per capsule    Sig: Take 1 capsule by mouth daily.    Dispense:  90 capsule    Refill:  1   See AVS for instructions about healthy living and anticipatory guidance.  Follow-up: Return in about 6 months (around 09/09/2015).  Scarlette Calico, MD

## 2015-03-15 ENCOUNTER — Encounter: Payer: Self-pay | Admitting: Podiatry

## 2015-03-15 DIAGNOSIS — M2041 Other hammer toe(s) (acquired), right foot: Secondary | ICD-10-CM | POA: Diagnosis not present

## 2015-03-15 DIAGNOSIS — M25774 Osteophyte, right foot: Secondary | ICD-10-CM | POA: Diagnosis not present

## 2015-03-22 ENCOUNTER — Encounter: Payer: Self-pay | Admitting: Podiatry

## 2015-03-22 ENCOUNTER — Ambulatory Visit: Payer: Self-pay

## 2015-03-22 ENCOUNTER — Ambulatory Visit (INDEPENDENT_AMBULATORY_CARE_PROVIDER_SITE_OTHER): Payer: Managed Care, Other (non HMO) | Admitting: Podiatry

## 2015-03-22 VITALS — BP 127/80 | HR 76 | Resp 12

## 2015-03-22 DIAGNOSIS — Z9889 Other specified postprocedural states: Secondary | ICD-10-CM

## 2015-03-22 DIAGNOSIS — M2041 Other hammer toe(s) (acquired), right foot: Secondary | ICD-10-CM

## 2015-03-22 NOTE — Progress Notes (Signed)
   Subjective:    [name], male    DOB: 1956-08-08, 59 y.o.   MRN: [id number]  HPI F/up on R 4-5th toe, hammertoe removal.   Review of Systems     Objective:   Physical Exam        Assessment & Plan:

## 2015-03-23 NOTE — Progress Notes (Signed)
Subjective:     [name], male   DOB: 1956/01/22, 59 y.o.   MRN: [id number]  HPI patient states I'm doing well with my right foot with minimal discomfort or swelling or pain   Review of Systems     Objective:   Physical Exam Neurovascular status intact negative Homan sign was noted with well-healing surgical site right fourth and fifth toe and the inner side of the fifth toe with a minimal amount of bleeding on the fifth toe noted but no redness or erythema noted or no proximal edema erythema or drainage noted    Assessment:     Doing well from digital arthroplasty digits 45 right and exostectomy digit 5 right    Plan:     Reapplied sterile dressings instructed on continued immobilization and open toed surgical shoe usage. Patient will be seen back again in the next 2 weeks for suture removal or earlier if any issues should occur

## 2015-03-24 NOTE — Progress Notes (Unsigned)
DOS 03/15/2015 Hammer toe repair with removal bone proximal 4th right, distal 5th right, removal pone spur medial 5th toe right.

## 2015-04-05 ENCOUNTER — Ambulatory Visit (INDEPENDENT_AMBULATORY_CARE_PROVIDER_SITE_OTHER): Payer: Managed Care, Other (non HMO)

## 2015-04-05 VITALS — BP 130/76 | HR 82 | Resp 15

## 2015-04-05 DIAGNOSIS — Z9889 Other specified postprocedural states: Secondary | ICD-10-CM | POA: Diagnosis not present

## 2015-04-05 DIAGNOSIS — M2041 Other hammer toe(s) (acquired), right foot: Secondary | ICD-10-CM

## 2015-04-05 MED ORDER — CEPHALEXIN 500 MG PO CAPS: 500.0000 mg | ORAL_CAPSULE | Freq: Three times a day (TID) | ORAL | Status: DC

## 2015-04-05 NOTE — Progress Notes (Signed)
Pt presents as post op patient DOS 6.14.16 doing well post digital arthroplasty 4,5 right and exostectomy 5th digit right foot. %th right toe has some serosanguinous drainage, and slight odor to area. He is afebrile at 98.3, denies pain but admits to some tingling in toes. Advised to start epsom salt soaks BID, wrote for keflex 500mg  TID, f/u with Dr Paulla Dolly in 1 week or sooner if symptoms worsen.

## 2015-04-11 ENCOUNTER — Encounter: Payer: Self-pay | Admitting: Podiatry

## 2015-04-11 ENCOUNTER — Ambulatory Visit (INDEPENDENT_AMBULATORY_CARE_PROVIDER_SITE_OTHER): Payer: Managed Care, Other (non HMO) | Admitting: Podiatry

## 2015-04-11 VITALS — BP 126/73 | HR 86 | Resp 15

## 2015-04-11 DIAGNOSIS — Z9889 Other specified postprocedural states: Secondary | ICD-10-CM

## 2015-04-11 DIAGNOSIS — M2041 Other hammer toe(s) (acquired), right foot: Secondary | ICD-10-CM

## 2015-04-12 NOTE — Progress Notes (Signed)
Subjective:     [name], male   DOB: 05-20-1956, 59 y.o.   MRN: [id number]  HPI patient presents stating I'm getting along better and having minimal discomfort and I'm able to walk without discomfort   Review of Systems     Objective:   Physical Exam Neurovascular status intact with digits 4 and 5 found to be in excellent position with wound edges well coapted and no drainage    Assessment:     Doing well post surgical intervention    Plan:     Reviewed x-rays stitches removed and applied sterile dressings. Begin gradual shoe gear usage and reappoint to recheck

## 2015-04-13 ENCOUNTER — Telehealth: Payer: Self-pay | Admitting: *Deleted

## 2015-04-13 NOTE — Telephone Encounter (Signed)
I'm calling to see if Dr. Paulla Dolly released you to go to work.  I'm not sure.  "I don't have a follow-up appointment scheduled."  When do you want to go back to work?  "I don't know, that's what I need to know from him.  They won't allow me to go back unless I have a date given by Dr. Paulla Dolly."  Okay, I'll check with him and give you a call back.

## 2015-04-14 NOTE — Telephone Encounter (Signed)
I called and informed patient that Dr. Paulla Dolly released you to go back to work.  "I can go back anytime?"  Yes, that is correct.  "Okay, thank you."

## 2015-06-17 ENCOUNTER — Other Ambulatory Visit: Payer: Self-pay

## 2015-06-17 MED ORDER — FINASTERIDE 5 MG PO TABS: ORAL_TABLET | ORAL | Status: DC

## 2015-08-22 ENCOUNTER — Other Ambulatory Visit: Payer: Self-pay | Admitting: Internal Medicine

## 2015-11-10 ENCOUNTER — Other Ambulatory Visit: Payer: Self-pay | Admitting: Internal Medicine

## 2015-12-10 ENCOUNTER — Other Ambulatory Visit: Payer: Self-pay | Admitting: Internal Medicine

## 2016-02-20 ENCOUNTER — Ambulatory Visit (INDEPENDENT_AMBULATORY_CARE_PROVIDER_SITE_OTHER): Payer: Managed Care, Other (non HMO) | Admitting: Internal Medicine

## 2016-02-20 ENCOUNTER — Other Ambulatory Visit (INDEPENDENT_AMBULATORY_CARE_PROVIDER_SITE_OTHER): Payer: Managed Care, Other (non HMO)

## 2016-02-20 ENCOUNTER — Encounter: Payer: Self-pay | Admitting: Internal Medicine

## 2016-02-20 VITALS — BP 126/70 | HR 81 | Temp 98.3°F | Resp 16 | Ht 66.5 in | Wt 196.0 lb

## 2016-02-20 DIAGNOSIS — Z0000 Encounter for general adult medical examination without abnormal findings: Secondary | ICD-10-CM

## 2016-02-20 DIAGNOSIS — J309 Allergic rhinitis, unspecified: Secondary | ICD-10-CM | POA: Insufficient documentation

## 2016-02-20 DIAGNOSIS — Z0001 Encounter for general adult medical examination with abnormal findings: Secondary | ICD-10-CM

## 2016-02-20 DIAGNOSIS — J301 Allergic rhinitis due to pollen: Secondary | ICD-10-CM | POA: Diagnosis not present

## 2016-02-20 DIAGNOSIS — R7309 Other abnormal glucose: Secondary | ICD-10-CM

## 2016-02-20 DIAGNOSIS — R0609 Other forms of dyspnea: Secondary | ICD-10-CM | POA: Insufficient documentation

## 2016-02-20 LAB — URINALYSIS, ROUTINE W REFLEX MICROSCOPIC
Bilirubin Urine: NEGATIVE
Hgb urine dipstick: NEGATIVE
Ketones, ur: NEGATIVE
Leukocytes, UA: NEGATIVE
Nitrite: NEGATIVE
RBC / HPF: NONE SEEN (ref 0–?)
SPECIFIC GRAVITY, URINE: 1.01 (ref 1.000–1.030)
TOTAL PROTEIN, URINE-UPE24: NEGATIVE
URINE GLUCOSE: NEGATIVE
Urobilinogen, UA: 0.2 (ref 0.0–1.0)
pH: 6.5 (ref 5.0–8.0)

## 2016-02-20 LAB — TSH: TSH: 0.63 u[IU]/mL (ref 0.35–4.50)

## 2016-02-20 LAB — CBC WITH DIFFERENTIAL/PLATELET
BASOS PCT: 0.4 % (ref 0.0–3.0)
Basophils Absolute: 0 10*3/uL (ref 0.0–0.1)
EOS ABS: 0.1 10*3/uL (ref 0.0–0.7)
Eosinophils Relative: 1.4 % (ref 0.0–5.0)
HCT: 42.5 % (ref 39.0–52.0)
Hemoglobin: 14.4 g/dL (ref 13.0–17.0)
LYMPHS ABS: 2.5 10*3/uL (ref 0.7–4.0)
Lymphocytes Relative: 29.4 % (ref 12.0–46.0)
MCHC: 34 g/dL (ref 30.0–36.0)
MCV: 88.3 fl (ref 78.0–100.0)
MONO ABS: 0.6 10*3/uL (ref 0.1–1.0)
Monocytes Relative: 7 % (ref 3.0–12.0)
NEUTROS ABS: 5.4 10*3/uL (ref 1.4–7.7)
Neutrophils Relative %: 61.8 % (ref 43.0–77.0)
PLATELETS: 235 10*3/uL (ref 150.0–400.0)
RBC: 4.81 Mil/uL (ref 4.22–5.81)
RDW: 13.6 % (ref 11.5–15.5)
WBC: 8.7 10*3/uL (ref 4.0–10.5)

## 2016-02-20 LAB — LIPID PANEL
CHOLESTEROL: 191 mg/dL (ref 0–200)
HDL: 47.8 mg/dL (ref >39.00)
LDL Cholesterol: 115 mg/dL — ABNORMAL HIGH (ref 0–99)
NonHDL: 143.39
Total CHOL/HDL Ratio: 4
Triglycerides: 141 mg/dL (ref 0.0–149.0)
VLDL: 28.2 mg/dL (ref 0.0–40.0)

## 2016-02-20 LAB — COMPREHENSIVE METABOLIC PANEL
ALBUMIN: 4.2 g/dL (ref 3.5–5.2)
ALT: 18 U/L (ref 0–53)
AST: 15 U/L (ref 0–37)
Alkaline Phosphatase: 75 U/L (ref 39–117)
BUN: 12 mg/dL (ref 6–23)
CHLORIDE: 103 meq/L (ref 96–112)
CO2: 27 mEq/L (ref 19–32)
CREATININE: 0.79 mg/dL (ref 0.40–1.50)
Calcium: 9 mg/dL (ref 8.4–10.5)
GFR: 107.01 mL/min (ref >60.00)
Glucose, Bld: 102 mg/dL — ABNORMAL HIGH (ref 70–99)
Potassium: 3.5 mEq/L (ref 3.5–5.1)
SODIUM: 137 meq/L (ref 135–145)
TOTAL PROTEIN: 7.2 g/dL (ref 6.0–8.3)
Total Bilirubin: 0.5 mg/dL (ref 0.2–1.2)

## 2016-02-20 LAB — HEMOGLOBIN A1C: HEMOGLOBIN A1C: 5.9 % (ref 4.6–6.5)

## 2016-02-20 LAB — FECAL OCCULT BLOOD, GUAIAC: FECAL OCCULT BLD: NEGATIVE

## 2016-02-20 MED ORDER — LEVOCETIRIZINE DIHYDROCHLORIDE 5 MG PO TABS: 5.0000 mg | ORAL_TABLET | Freq: Every evening | ORAL | Status: DC

## 2016-02-20 NOTE — Patient Instructions (Signed)

## 2016-02-20 NOTE — Progress Notes (Signed)
Pre visit review using our clinic review tool, if applicable. No additional management support is needed unless otherwise documented below in the visit note. 

## 2016-02-20 NOTE — Progress Notes (Signed)
Subjective:  [name], male    DOB: [birth year]  Age: 60 y.o. MRN: [id number]  CC: Hypertension and Annual Exam   HPI Leonardo Koob presents for a CPX and BP check.  He tells me that his blood pressure has been well controlled on the combination of amlodipine and ACE inhibitor but he also states over the last 3-4 months he has developed fatigue and dyspnea on exertion. He denies episodes of chest pain, palpitations, edema, syncope, or diaphoresis.  He complains of nasal allergy symptoms with sneezing, congestion, and postnasal drip. He has gotten modest symptom relief with Flonase nasal spray.  Outpatient Prescriptions Prior to Visit  Medication Sig Dispense Refill  . amLODipine-benazepril (LOTREL) 10-20 MG capsule TAKE 1 CAPSULE BY MOUTH DAILY. 90 capsule 1  . aspirin 81 MG tablet Take 81 mg by mouth daily.      Marland Kitchen doxazosin (CARDURA) 2 MG tablet TAKE ONE TABLET BY MOUTH ONCE DAILY 90 tablet 3  . finasteride (PROSCAR) 5 MG tablet TAKE 1 TABLET DAILY 90 tablet 1  . fluticasone (FLONASE) 50 MCG/ACT nasal spray Place 1 spray into both nostrils daily.    . folic acid (FOLVITE) [id number] MCG tablet Take 400 mcg by mouth daily.     . Garlic 123XX123 MG CAPS Take by mouth daily.     . Multiple Vitamin (MULTIVITAMIN) tablet Take 1 tablet by mouth daily.     . Omega-3 Fatty Acids (FISH OIL) 1000 MG CAPS Take 1 capsule by mouth daily.    . vitamin E 400 UNIT capsule Take 400 Units by mouth daily.     . cephALEXin (KEFLEX) 500 MG capsule Take 1 capsule (500 mg total) by mouth 3 (three) times daily. 30 capsule 0  . HYDROcodone-acetaminophen (NORCO) 10-325 MG per tablet Take 1 tablet by mouth every 6 (six) hours as needed (one tablet every 4-6 hours prn foot pain).     No facility-administered medications prior to visit.    ROS Review of Systems  Constitutional: Positive for fatigue. Negative for fever, chills and diaphoresis.  HENT: Positive for congestion, postnasal drip and rhinorrhea.  Negative for dental problem, facial swelling, nosebleeds, sinus pressure, sneezing, sore throat, tinnitus and trouble swallowing.   Eyes: Negative.   Respiratory: Positive for shortness of breath. Negative for apnea, cough, choking, chest tightness, wheezing and stridor.   Cardiovascular: Negative.  Negative for chest pain, palpitations and leg swelling.  Gastrointestinal: Negative.  Negative for nausea, vomiting, abdominal pain, diarrhea and constipation.  Endocrine: Negative.   Genitourinary: Negative.  Negative for dysuria, urgency, hematuria, decreased urine volume, difficulty urinating and penile pain.  Musculoskeletal: Negative.  Negative for myalgias, back pain, joint swelling, arthralgias and neck pain.  Skin: Negative.  Negative for color change and rash.  Allergic/Immunologic: Negative.   Neurological: Negative.  Negative for dizziness, speech difficulty, weakness, light-headedness and numbness.  Hematological: Negative.  Negative for adenopathy. Does not bruise/bleed easily.  Psychiatric/Behavioral: Negative.  Negative for hallucinations, sleep disturbance, self-injury, dysphoric mood and agitation. The patient is not nervous/anxious.     Objective:  BP 126/70 mmHg  Pulse 81  Temp(Src) 98.3 F (36.8 C) (Oral)  Resp 16  Ht 5' 6.5" (1.689 m)  Wt 196 lb (88.905 kg)  BMI 31.16 kg/m2  SpO2 95%  BP Readings from Last 3 Encounters:  02/20/16 126/70  04/11/15 126/73  04/05/15 130/76    Wt Readings from Last 3 Encounters:  02/20/16 196 lb (88.905 kg)  03/10/15 194 lb (87.998 kg)  01/31/15 195 lb (88.451 kg)    Physical Exam  Constitutional: He is oriented to person, place, and time. He appears well-developed and well-nourished. No distress.  HENT:  Mouth/Throat: Oropharynx is clear and moist. No oropharyngeal exudate.  Eyes: Conjunctivae are normal. Right eye exhibits no discharge. Left eye exhibits no discharge. No scleral icterus.  Neck: Normal range of motion. Neck  supple. No JVD present. No tracheal deviation present. No thyromegaly present.  Cardiovascular: Normal rate, regular rhythm, normal heart sounds and intact distal pulses.  Exam reveals no gallop and no friction rub.   No murmur heard. EKG ----  Sinus  Rhythm  WITHIN NORMAL LIMITS  Pulmonary/Chest: Effort normal and breath sounds normal. No stridor. No respiratory distress. He has no wheezes. He has no rales. He exhibits no tenderness.  Abdominal: Soft. Bowel sounds are normal. He exhibits no distension and no mass. There is no tenderness. There is no rebound and no guarding. Hernia confirmed negative in the right inguinal area and confirmed negative in the left inguinal area.  Genitourinary: Rectum normal, testes normal and penis normal. Rectal exam shows no external hemorrhoid, no internal hemorrhoid, no fissure, no mass, no tenderness and anal tone normal. Guaiac negative stool. Prostate is enlarged (2+ smooth symm BPH). Prostate is not tender. Right testis shows no mass, no swelling and no tenderness. Right testis is descended. Left testis shows no mass, no swelling and no tenderness. Left testis is descended. Circumcised. No penile erythema or penile tenderness. No discharge found.  Musculoskeletal: Normal range of motion. He exhibits no edema or tenderness.  Lymphadenopathy:    He has no cervical adenopathy.       Right: No inguinal adenopathy present.       Left: No inguinal adenopathy present.  Neurological: He is oriented to person, place, and time.  Skin: Skin is warm and dry. No rash noted. He is not diaphoretic. No erythema. No pallor.  Psychiatric: He has a normal mood and affect. His behavior is normal. Judgment and thought content normal.  Vitals reviewed.   Lab Results  Component Value Date   WBC 8.7 02/20/2016   HGB 14.4 02/20/2016   HCT 42.5 02/20/2016   PLT 235.0 02/20/2016   GLUCOSE 102* 02/20/2016   CHOL 191 02/20/2016   TRIG 141.0 02/20/2016   HDL 47.80 02/20/2016     LDLDIRECT 121.6 12/03/2011   LDLCALC 115* 02/20/2016   ALT 18 02/20/2016   AST 15 02/20/2016   NA 137 02/20/2016   K 3.5 02/20/2016   CL 103 02/20/2016   CREATININE 0.79 02/20/2016   BUN 12 02/20/2016   CO2 27 02/20/2016   TSH 0.63 02/20/2016   PSA 0.16 03/10/2015   HGBA1C 5.9 02/20/2016    Dg Lumbar Spine Complete  05/22/2013  CLINICAL DATA:  60 year old male with left-sided low back pain. Lifting injury. EXAM: LUMBAR SPINE - COMPLETE 4+ VIEW COMPARISON:  Abdominal radiographs 05/07/2010. CT Abdomen and Pelvis 08/20/2006. FINDINGS: Normal lumbar segmentation. Stable vertebral height and alignment. Mild progression of degenerative endplate spurring since Jan 05, 2006, most pronounced at L2-L3. Still, disc spaces are relatively preserved. No pars fracture. Mild L5-S1 facet hypertrophy. Lower thoracic levels appear intact. Sacral a ala and SI joints within normal limits. IMPRESSION: No acute osseous abnormality in the lumbar spine. Electronically Signed   By: Lars Pinks   On: 05/22/2013 10:15    Assessment & Plan:   Fritz was seen today for hypertension and annual exam.  Diagnoses and all orders for this  visit:  Other abnormal glucose- his A1c is up to 5.9%, he is prediabetic, no medications are needed at this time, he agrees to work on his lifestyle modifications with diet/exercise/weight loss. -     Hemoglobin A1c; Future  Routine general medical examination at a health care facility- exam completed, labs ordered and reviewed, his colonoscopy is up-to-date, vaccines reviewed and updated, patient education material was given. -     Lipid panel; Future -     Comprehensive metabolic panel; Future -     CBC with Differential/Platelet; Future -     TSH; Future -     Urinalysis, Routine w reflex microscopic (not at [facility]); Future -     Hepatitis C antibody; Future -     HIV antibody; Future  DOE (dyspnea on exertion)- his EKG is normal, I have asked him to undergo an ETT to screen for  coronary artery disease. -     EKG 12-Lead -     Exercise Tolerance Test; Future  Allergic rhinitis due to pollen- we'll start a once daily oral antihistamine -     levocetirizine (XYZAL) 5 MG tablet; Take 1 tablet (5 mg total) by mouth every evening.   I have discontinued Mr. Radford HYDROcodone-acetaminophen and cephALEXin. I am also having him start on levocetirizine. Additionally, I am having him maintain his aspirin, multivitamin, folic acid, Garlic, vitamin E, Fish Oil, fluticasone, doxazosin, amLODipine-benazepril, and finasteride.  Meds ordered this encounter  Medications  . levocetirizine (XYZAL) 5 MG tablet    Sig: Take 1 tablet (5 mg total) by mouth every evening.    Dispense:  90 tablet    Refill:  3     Follow-up: Return in about 6 months (around 08/22/2016).  Scarlette Calico, MD

## 2016-02-21 ENCOUNTER — Encounter: Payer: Self-pay | Admitting: Internal Medicine

## 2016-02-21 LAB — HIV ANTIBODY (ROUTINE TESTING W REFLEX): HIV 1&2 Ab, 4th Generation: NONREACTIVE

## 2016-02-21 LAB — HEPATITIS C ANTIBODY: HCV AB: NEGATIVE

## 2016-02-22 ENCOUNTER — Telehealth (HOSPITAL_COMMUNITY): Payer: Self-pay | Admitting: *Deleted

## 2016-02-22 NOTE — Telephone Encounter (Signed)
Lm for patient to call and schedule ETT ordered by Dr. Scarlette Calico

## 2016-03-06 ENCOUNTER — Ambulatory Visit (INDEPENDENT_AMBULATORY_CARE_PROVIDER_SITE_OTHER): Payer: Managed Care, Other (non HMO)

## 2016-03-06 ENCOUNTER — Encounter: Payer: Self-pay | Admitting: Internal Medicine

## 2016-03-06 DIAGNOSIS — R0609 Other forms of dyspnea: Secondary | ICD-10-CM | POA: Diagnosis not present

## 2016-03-06 LAB — EXERCISE TOLERANCE TEST
CSEPEDS: 0 s
CSEPEW: 11.7 METS
CSEPHR: 95 %
CSEPPHR: 155 {beats}/min
Exercise duration (min): 10 min
MPHR: 162 {beats}/min
RPE: 18
Rest HR: 74 {beats}/min

## 2016-05-04 ENCOUNTER — Other Ambulatory Visit: Payer: Self-pay | Admitting: Internal Medicine

## 2016-06-08 ENCOUNTER — Other Ambulatory Visit: Payer: Self-pay | Admitting: Internal Medicine

## 2016-08-24 ENCOUNTER — Other Ambulatory Visit: Payer: Self-pay | Admitting: Internal Medicine

## 2016-08-29 ENCOUNTER — Telehealth: Payer: Self-pay

## 2016-08-29 ENCOUNTER — Other Ambulatory Visit: Payer: Self-pay | Admitting: Internal Medicine

## 2016-08-29 DIAGNOSIS — N138 Other obstructive and reflux uropathy: Secondary | ICD-10-CM

## 2016-08-29 DIAGNOSIS — N401 Enlarged prostate with lower urinary tract symptoms: Principal | ICD-10-CM

## 2016-08-29 MED ORDER — DOXAZOSIN MESYLATE 2 MG PO TABS: 2.0000 mg | ORAL_TABLET | Freq: Every day | ORAL | 3 refills | Status: DC

## 2016-08-29 NOTE — Telephone Encounter (Signed)
rf rq for doxazosin to [facility].   Please advise

## 2017-02-19 ENCOUNTER — Other Ambulatory Visit: Payer: Self-pay | Admitting: Internal Medicine

## 2017-02-19 DIAGNOSIS — J301 Allergic rhinitis due to pollen: Secondary | ICD-10-CM

## 2017-03-03 ENCOUNTER — Other Ambulatory Visit: Payer: Self-pay | Admitting: Internal Medicine

## 2017-03-05 ENCOUNTER — Ambulatory Visit (INDEPENDENT_AMBULATORY_CARE_PROVIDER_SITE_OTHER): Payer: Managed Care, Other (non HMO) | Admitting: Internal Medicine

## 2017-03-05 ENCOUNTER — Other Ambulatory Visit (INDEPENDENT_AMBULATORY_CARE_PROVIDER_SITE_OTHER): Payer: Managed Care, Other (non HMO)

## 2017-03-05 ENCOUNTER — Encounter: Payer: Self-pay | Admitting: Internal Medicine

## 2017-03-05 VITALS — BP 148/78 | HR 63 | Temp 98.6°F | Resp 16 | Wt 197.0 lb

## 2017-03-05 DIAGNOSIS — I10 Essential (primary) hypertension: Secondary | ICD-10-CM | POA: Diagnosis not present

## 2017-03-05 DIAGNOSIS — Z0000 Encounter for general adult medical examination without abnormal findings: Secondary | ICD-10-CM

## 2017-03-05 DIAGNOSIS — N401 Enlarged prostate with lower urinary tract symptoms: Secondary | ICD-10-CM | POA: Diagnosis not present

## 2017-03-05 DIAGNOSIS — R0609 Other forms of dyspnea: Secondary | ICD-10-CM | POA: Diagnosis not present

## 2017-03-05 DIAGNOSIS — G4733 Obstructive sleep apnea (adult) (pediatric): Secondary | ICD-10-CM

## 2017-03-05 DIAGNOSIS — N138 Other obstructive and reflux uropathy: Secondary | ICD-10-CM | POA: Diagnosis not present

## 2017-03-05 LAB — CBC WITH DIFFERENTIAL/PLATELET
BASOS ABS: 0.1 10*3/uL (ref 0.0–0.1)
Basophils Relative: 0.8 % (ref 0.0–3.0)
EOS ABS: 0.1 10*3/uL (ref 0.0–0.7)
Eosinophils Relative: 1.7 % (ref 0.0–5.0)
HEMATOCRIT: 43.7 % (ref 39.0–52.0)
Hemoglobin: 15 g/dL (ref 13.0–17.0)
LYMPHS ABS: 3 10*3/uL (ref 0.7–4.0)
LYMPHS PCT: 35.6 % (ref 12.0–46.0)
MCHC: 34.3 g/dL (ref 30.0–36.0)
MCV: 89 fl (ref 78.0–100.0)
MONO ABS: 0.7 10*3/uL (ref 0.1–1.0)
Monocytes Relative: 8.7 % (ref 3.0–12.0)
NEUTROS ABS: 4.5 10*3/uL (ref 1.4–7.7)
NEUTROS PCT: 53.2 % (ref 43.0–77.0)
PLATELETS: 230 10*3/uL (ref 150.0–400.0)
RBC: 4.91 Mil/uL (ref 4.22–5.81)
RDW: 13.2 % (ref 11.5–15.5)
WBC: 8.5 10*3/uL (ref 4.0–10.5)

## 2017-03-05 LAB — LIPID PANEL
CHOL/HDL RATIO: 3
Cholesterol: 188 mg/dL (ref 0–200)
HDL: 62.7 mg/dL (ref >39.00)
LDL Cholesterol: 97 mg/dL (ref 0–99)
NONHDL: 124.84
Triglycerides: 137 mg/dL (ref 0.0–149.0)
VLDL: 27.4 mg/dL (ref 0.0–40.0)

## 2017-03-05 LAB — COMPREHENSIVE METABOLIC PANEL
ALT: 23 U/L (ref 0–53)
AST: 17 U/L (ref 0–37)
Albumin: 4.4 g/dL (ref 3.5–5.2)
Alkaline Phosphatase: 75 U/L (ref 39–117)
BILIRUBIN TOTAL: 0.5 mg/dL (ref 0.2–1.2)
BUN: 11 mg/dL (ref 6–23)
CO2: 30 meq/L (ref 19–32)
CREATININE: 0.91 mg/dL (ref 0.40–1.50)
Calcium: 9.5 mg/dL (ref 8.4–10.5)
Chloride: 102 mEq/L (ref 96–112)
GFR: 90.57 mL/min (ref >60.00)
GLUCOSE: 100 mg/dL — AB (ref 70–99)
Potassium: 4.2 mEq/L (ref 3.5–5.1)
SODIUM: 140 meq/L (ref 135–145)
Total Protein: 7.4 g/dL (ref 6.0–8.3)

## 2017-03-05 LAB — TSH: TSH: 1.27 u[IU]/mL (ref 0.35–4.50)

## 2017-03-05 LAB — PSA: PSA: 0.19 ng/mL (ref 0.10–4.00)

## 2017-03-05 MED ORDER — FINASTERIDE 5 MG PO TABS: 5.0000 mg | ORAL_TABLET | Freq: Every day | ORAL | 2 refills | Status: DC

## 2017-03-05 NOTE — Progress Notes (Signed)
Subjective:  [name], male    DOB: [birth year]  Age: 61 y.o. MRN: [id number]  CC: Hypertension and Annual Exam   HPI Kanden Carey presents for a CPX.  He complains of heavy snoring and sleep apnea and tells me he is not using his CPAP. His blood pressure has been well controlled and he has had no recent episodes of chest pain. He suffers from chronic DOE. He underwent an ETT about a year ago that was normal. He has not been able to lose weight. He has had no recent episodes of diaphoresis, edema, palpitations, or fatigue.  Outpatient Medications Prior to Visit  Medication Sig Dispense Refill  . amLODipine-benazepril (LOTREL) 10-20 MG capsule TAKE 1 CAPSULE BY MOUTH DAILY. 90 capsule 3  . aspirin 81 MG tablet Take 81 mg by mouth daily.      Marland Kitchen doxazosin (CARDURA) 2 MG tablet Take 1 tablet (2 mg total) by mouth daily. 90 tablet 3  . fluticasone (FLONASE) 50 MCG/ACT nasal spray Place 1 spray into both nostrils daily.    . folic acid (FOLVITE) 106 MCG tablet Take 400 mcg by mouth daily.     Marland Kitchen levocetirizine (XYZAL) 5 MG tablet TAKE 1 TABLET (5 MG TOTAL) BY MOUTH EVERY EVENING. 90 tablet 3  . finasteride (PROSCAR) 5 MG tablet TAKE 1 TABLET DAILY 90 tablet 2  . Garlic 2694 MG CAPS Take by mouth daily.     . Multiple Vitamin (MULTIVITAMIN) tablet Take 1 tablet by mouth daily.     . Omega-3 Fatty Acids (FISH OIL) 1000 MG CAPS Take 1 capsule by mouth daily.    . vitamin E 400 UNIT capsule Take 400 Units by mouth daily.      No facility-administered medications prior to visit.     ROS Review of Systems  Constitutional: Negative.  Negative for appetite change, diaphoresis, fatigue and unexpected weight change.  HENT: Negative.   Eyes: Negative.  Negative for visual disturbance.  Respiratory: Positive for apnea and shortness of breath. Negative for cough, chest tightness, wheezing and stridor.   Cardiovascular: Negative.  Negative for chest pain, palpitations and leg swelling.    Gastrointestinal: Negative for abdominal pain, constipation, diarrhea, nausea and vomiting.  Endocrine: Negative.   Genitourinary: Negative.  Negative for difficulty urinating.  Musculoskeletal: Negative.   Skin: Negative.  Negative for rash.  Neurological: Negative.  Negative for dizziness, weakness and light-headedness.  Hematological: Negative.  Negative for adenopathy.  Psychiatric/Behavioral: Negative.     Objective:  BP (!) 148/78   Pulse 63   Temp 98.6 F (37 C)   Resp 16   Wt 197 lb (89.4 kg)   SpO2 100%   BMI 31.32 kg/m   BP Readings from Last 3 Encounters:  03/05/17 (!) 148/78  02/20/16 126/70  04/11/15 126/73    Wt Readings from Last 3 Encounters:  03/05/17 197 lb (89.4 kg)  02/20/16 196 lb (88.9 kg)  03/10/15 194 lb (88 kg)    Physical Exam  Constitutional: He is oriented to person, place, and time. No distress.  HENT:  Mouth/Throat: Oropharynx is clear and moist. No oropharyngeal exudate.  Eyes: Conjunctivae are normal. Right eye exhibits no discharge. Left eye exhibits no discharge. No scleral icterus.  Neck: Normal range of motion. Neck supple. No JVD present. No thyromegaly present.  Cardiovascular: Normal rate and regular rhythm.  Exam reveals no gallop.   No murmur heard. EKG - Sinus  Rhythm  -RSR(V1) -nondiagnostic.   PROBABLY NORMAL -  no LVH, no signs of ischemia, and no changes compared to prior EKGs  Pulmonary/Chest: Breath sounds normal. No respiratory distress. He has no wheezes. He has no rales.  Abdominal: Soft. Bowel sounds are normal. He exhibits no distension and no mass. There is no tenderness. Hernia confirmed negative in the right inguinal area and confirmed negative in the left inguinal area.  Genitourinary: Rectum normal, testes normal and penis normal. Rectal exam shows no external hemorrhoid, no internal hemorrhoid, no fissure, no mass, no tenderness, anal tone normal and guaiac negative stool. Prostate is enlarged (1+ symm BPH).  Prostate is not tender. Right testis shows no mass, no swelling and no tenderness. Left testis shows no mass, no swelling and no tenderness. Left testis is descended. Circumcised. No hypospadias or penile tenderness. No discharge found.  Musculoskeletal: Normal range of motion. He exhibits no edema or tenderness.  Lymphadenopathy:    He has no cervical adenopathy.       Right: No inguinal adenopathy present.       Left: No inguinal adenopathy present.  Neurological: He is oriented to person, place, and time. He has normal reflexes.  Skin: Skin is warm and dry. No rash noted. He is not diaphoretic. No erythema. No pallor.  Vitals reviewed.   Lab Results  Component Value Date   WBC 8.5 03/05/2017   HGB 15.0 03/05/2017   HCT 43.7 03/05/2017   PLT 230.0 03/05/2017   GLUCOSE 100 (H) 03/05/2017   CHOL 188 03/05/2017   TRIG 137.0 03/05/2017   HDL 62.70 03/05/2017   LDLDIRECT 121.6 12/03/2011   LDLCALC 97 03/05/2017   ALT 23 03/05/2017   AST 17 03/05/2017   NA 140 03/05/2017   K 4.2 03/05/2017   CL 102 03/05/2017   CREATININE 0.91 03/05/2017   BUN 11 03/05/2017   CO2 30 03/05/2017   TSH 1.27 03/05/2017   PSA 0.19 03/05/2017   HGBA1C 5.9 02/20/2016    Dg Lumbar Spine Complete  Result Date: 05/22/2013 CLINICAL DATA:  61 year old male with left-sided low back pain. Lifting injury. EXAM: LUMBAR SPINE - COMPLETE 4+ VIEW COMPARISON:  Abdominal radiographs 05/07/2010. CT Abdomen and Pelvis 08/20/2006. FINDINGS: Normal lumbar segmentation. Stable vertebral height and alignment. Mild progression of degenerative endplate spurring since 09-Jan-2006, most pronounced at L2-L3. Still, disc spaces are relatively preserved. No pars fracture. Mild L5-S1 facet hypertrophy. Lower thoracic levels appear intact. Sacral a ala and SI joints within normal limits. IMPRESSION: No acute osseous abnormality in the lumbar spine. Electronically Signed   By: Lars Pinks   On: 05/22/2013 10:15    Assessment & Plan:    Keaten was seen today for hypertension and annual exam.  Diagnoses and all orders for this visit:  DOE (dyspnea on exertion)- he has undergone a normal ETT over the last year so I don't think this is related to ischemia. His EKG continues to show no evidence of LVH or ischemia. I think this is related to obesity and conditioning and of asked him to work on his lifestyle modifications. -     EKG 12-Lead  Routine general medical examination at a health care facility- exam completed, labs ordered and reviewed, vaccines reviewed, will screen for colon cancer and polyps with a Cologuard test, patient education material was given. -     Lipid panel; Future -     Comprehensive metabolic panel; Future -     TSH; Future -     PSA; Future -     CBC with  Differential/Platelet; Future -     EKG 12-Lead  BPH with obstruction/lower urinary tract symptoms- his PSA is not elevated so I'm not concerned about prostate cancer, his symptoms are adequately well controlled, will continue finasteride. -     finasteride (PROSCAR) 5 MG tablet; Take 1 tablet (5 mg total) by mouth daily.  OSA (obstructive sleep apnea)- I've asked him to see sleep medicine to have this evaluated and treated. -     Ambulatory referral to Sleep Studies   I have discontinued Mr. Champine multivitamin, Garlic, vitamin E, and Fish Oil. I have also changed his finasteride. Additionally, I am having him maintain his aspirin, folic acid, fluticasone, amLODipine-benazepril, doxazosin, and levocetirizine.  Meds ordered this encounter  Medications  . finasteride (PROSCAR) 5 MG tablet    Sig: Take 1 tablet (5 mg total) by mouth daily.    Dispense:  90 tablet    Refill:  2     Follow-up: Return in about 6 months (around 09/04/2017).  Scarlette Calico, MD

## 2017-03-05 NOTE — Patient Instructions (Signed)

## 2017-03-06 ENCOUNTER — Encounter: Payer: Self-pay | Admitting: Internal Medicine

## 2017-03-06 ENCOUNTER — Telehealth: Payer: Self-pay

## 2017-03-06 NOTE — Telephone Encounter (Signed)
Order [id number]

## 2017-03-13 NOTE — Telephone Encounter (Signed)
Kit delivered to patient 

## 2017-03-23 LAB — COLOGUARD: Cologuard: NEGATIVE

## 2017-03-25 NOTE — Telephone Encounter (Signed)
Result is negative

## 2017-05-02 ENCOUNTER — Other Ambulatory Visit: Payer: Self-pay | Admitting: Internal Medicine

## 2017-07-29 ENCOUNTER — Encounter: Payer: Self-pay | Admitting: *Deleted

## 2017-07-29 NOTE — Telephone Encounter (Signed)
error    This encounter was created in error - please disregard.

## 2017-08-24 ENCOUNTER — Other Ambulatory Visit: Payer: Self-pay | Admitting: Internal Medicine

## 2017-08-24 DIAGNOSIS — N138 Other obstructive and reflux uropathy: Secondary | ICD-10-CM

## 2017-08-24 DIAGNOSIS — N401 Enlarged prostate with lower urinary tract symptoms: Principal | ICD-10-CM

## 2017-11-11 ENCOUNTER — Other Ambulatory Visit: Payer: Self-pay | Admitting: Internal Medicine

## 2017-11-21 ENCOUNTER — Other Ambulatory Visit: Payer: Self-pay | Admitting: Internal Medicine

## 2017-11-21 DIAGNOSIS — N138 Other obstructive and reflux uropathy: Secondary | ICD-10-CM

## 2017-11-21 DIAGNOSIS — N401 Enlarged prostate with lower urinary tract symptoms: Principal | ICD-10-CM

## 2017-11-30 ENCOUNTER — Other Ambulatory Visit: Payer: Self-pay | Admitting: Internal Medicine

## 2017-11-30 DIAGNOSIS — N138 Other obstructive and reflux uropathy: Secondary | ICD-10-CM

## 2017-11-30 DIAGNOSIS — N401 Enlarged prostate with lower urinary tract symptoms: Principal | ICD-10-CM

## 2018-02-05 ENCOUNTER — Other Ambulatory Visit: Payer: Self-pay | Admitting: Internal Medicine

## 2018-02-11 ENCOUNTER — Other Ambulatory Visit: Payer: Self-pay | Admitting: Internal Medicine

## 2018-02-11 DIAGNOSIS — J301 Allergic rhinitis due to pollen: Secondary | ICD-10-CM

## 2018-02-21 ENCOUNTER — Other Ambulatory Visit: Payer: Self-pay | Admitting: Internal Medicine

## 2018-02-21 DIAGNOSIS — N138 Other obstructive and reflux uropathy: Secondary | ICD-10-CM

## 2018-02-21 DIAGNOSIS — N401 Enlarged prostate with lower urinary tract symptoms: Principal | ICD-10-CM

## 2018-02-24 ENCOUNTER — Other Ambulatory Visit: Payer: Self-pay | Admitting: Internal Medicine

## 2018-03-06 ENCOUNTER — Encounter: Payer: Managed Care, Other (non HMO) | Admitting: Internal Medicine

## 2018-03-13 ENCOUNTER — Encounter: Payer: Self-pay | Admitting: Internal Medicine

## 2018-03-13 ENCOUNTER — Other Ambulatory Visit (INDEPENDENT_AMBULATORY_CARE_PROVIDER_SITE_OTHER): Payer: Managed Care, Other (non HMO)

## 2018-03-13 ENCOUNTER — Ambulatory Visit (INDEPENDENT_AMBULATORY_CARE_PROVIDER_SITE_OTHER): Payer: Managed Care, Other (non HMO) | Admitting: Internal Medicine

## 2018-03-13 VITALS — BP 160/90 | HR 67 | Temp 97.7°F | Resp 16 | Ht 66.5 in | Wt 213.4 lb

## 2018-03-13 DIAGNOSIS — G4733 Obstructive sleep apnea (adult) (pediatric): Secondary | ICD-10-CM

## 2018-03-13 DIAGNOSIS — E559 Vitamin D deficiency, unspecified: Secondary | ICD-10-CM

## 2018-03-13 DIAGNOSIS — R7309 Other abnormal glucose: Secondary | ICD-10-CM | POA: Diagnosis not present

## 2018-03-13 DIAGNOSIS — R0609 Other forms of dyspnea: Secondary | ICD-10-CM | POA: Diagnosis not present

## 2018-03-13 DIAGNOSIS — Z0000 Encounter for general adult medical examination without abnormal findings: Secondary | ICD-10-CM

## 2018-03-13 DIAGNOSIS — E785 Hyperlipidemia, unspecified: Secondary | ICD-10-CM | POA: Diagnosis not present

## 2018-03-13 DIAGNOSIS — I10 Essential (primary) hypertension: Secondary | ICD-10-CM

## 2018-03-13 LAB — COMPREHENSIVE METABOLIC PANEL
ALT: 31 U/L (ref 0–53)
AST: 20 U/L (ref 0–37)
Albumin: 4.3 g/dL (ref 3.5–5.2)
Alkaline Phosphatase: 78 U/L (ref 39–117)
BUN: 11 mg/dL (ref 6–23)
CHLORIDE: 100 meq/L (ref 96–112)
CO2: 31 meq/L (ref 19–32)
Calcium: 9.4 mg/dL (ref 8.4–10.5)
Creatinine, Ser: 0.89 mg/dL (ref 0.40–1.50)
GFR: 92.6 mL/min (ref >60.00)
GLUCOSE: 94 mg/dL (ref 70–99)
POTASSIUM: 4.1 meq/L (ref 3.5–5.1)
SODIUM: 140 meq/L (ref 135–145)
TOTAL PROTEIN: 7.3 g/dL (ref 6.0–8.3)
Total Bilirubin: 0.4 mg/dL (ref 0.2–1.2)

## 2018-03-13 LAB — URINALYSIS, ROUTINE W REFLEX MICROSCOPIC
BILIRUBIN URINE: NEGATIVE
HGB URINE DIPSTICK: NEGATIVE
Ketones, ur: NEGATIVE
Leukocytes, UA: NEGATIVE
NITRITE: NEGATIVE
RBC / HPF: NONE SEEN (ref 0–?)
Total Protein, Urine: NEGATIVE
Urine Glucose: NEGATIVE
Urobilinogen, UA: 0.2 (ref 0.0–1.0)
pH: 7.5 (ref 5.0–8.0)

## 2018-03-13 LAB — CBC WITH DIFFERENTIAL/PLATELET
BASOS ABS: 0.1 10*3/uL (ref 0.0–0.1)
Basophils Relative: 0.7 % (ref 0.0–3.0)
EOS PCT: 2 % (ref 0.0–5.0)
Eosinophils Absolute: 0.2 10*3/uL (ref 0.0–0.7)
HCT: 44.5 % (ref 39.0–52.0)
HEMOGLOBIN: 15.2 g/dL (ref 13.0–17.0)
LYMPHS ABS: 3 10*3/uL (ref 0.7–4.0)
Lymphocytes Relative: 35.9 % (ref 12.0–46.0)
MCHC: 34.1 g/dL (ref 30.0–36.0)
MCV: 90.2 fl (ref 78.0–100.0)
Monocytes Absolute: 0.7 10*3/uL (ref 0.1–1.0)
Monocytes Relative: 8 % (ref 3.0–12.0)
NEUTROS PCT: 53.4 % (ref 43.0–77.0)
Neutro Abs: 4.4 10*3/uL (ref 1.4–7.7)
Platelets: 204 10*3/uL (ref 150.0–400.0)
RBC: 4.94 Mil/uL (ref 4.22–5.81)
RDW: 13.7 % (ref 11.5–15.5)
WBC: 8.2 10*3/uL (ref 4.0–10.5)

## 2018-03-13 LAB — HEMOGLOBIN A1C: Hgb A1c MFr Bld: 5.9 % (ref 4.6–6.5)

## 2018-03-13 LAB — LIPID PANEL
CHOL/HDL RATIO: 4
Cholesterol: 209 mg/dL — ABNORMAL HIGH (ref 0–200)
HDL: 57.4 mg/dL (ref >39.00)
LDL Cholesterol: 112 mg/dL — ABNORMAL HIGH (ref 0–99)
NONHDL: 151.88
Triglycerides: 198 mg/dL — ABNORMAL HIGH (ref 0.0–149.0)
VLDL: 39.6 mg/dL (ref 0.0–40.0)

## 2018-03-13 LAB — VITAMIN D 25 HYDROXY (VIT D DEFICIENCY, FRACTURES): VITD: 14.93 ng/mL — AB (ref 30.00–100.00)

## 2018-03-13 LAB — PSA: PSA: 0.23 ng/mL (ref 0.10–4.00)

## 2018-03-13 LAB — TSH: TSH: 0.5 u[IU]/mL (ref 0.35–4.50)

## 2018-03-13 MED ORDER — AZILSARTAN-CHLORTHALIDONE 40-12.5 MG PO TABS: 1.0000 | ORAL_TABLET | Freq: Every day | ORAL | 0 refills | Status: DC

## 2018-03-13 NOTE — Progress Notes (Signed)
Subjective:  [name], male    DOB: Dec 18, 1955  Age: 62 y.o. MRN: [id number]  CC: Annual Exam and Hypertension   HPI Rodriguez Aguinaldo presents for a CPX.  He complains of chronic unchanged DOE.  This has been going on for several years.  He underwent an ETT about 2 years ago that was negative for ischemia.  He has heavy snoring and sleep apnea but is not currently being treated.  He also complains that his blood pressure is not well controlled and he has swelling in both legs.  He complains of weight gain but denies chest pain, diaphoresis, dizziness, lightheadedness, or near syncope.  Outpatient Medications Prior to Visit  Medication Sig Dispense Refill  . doxazosin (CARDURA) 2 MG tablet TAKE 1 TABLET (2 MG TOTAL) BY MOUTH DAILY. 90 tablet 0  . finasteride (PROSCAR) 5 MG tablet TAKE 1 TABLET BY MOUTH EVERY DAY 90 tablet 0  . fluticasone (FLONASE) 50 MCG/ACT nasal spray Place 1 spray into both nostrils daily.    Marland Kitchen levocetirizine (XYZAL) 5 MG tablet Take 1 tablet (5 mg total) by mouth every evening. 90 tablet 3  . amLODipine-benazepril (LOTREL) 10-20 MG capsule TAKE 1 CAPSULE BY MOUTH DAILY. 90 capsule 0  . aspirin 81 MG tablet Take 81 mg by mouth daily.      . folic acid (FOLVITE) 409 MCG tablet Take 400 mcg by mouth daily.      No facility-administered medications prior to visit.     ROS Review of Systems  Constitutional: Positive for unexpected weight change. Negative for diaphoresis and fatigue.  HENT: Negative.   Eyes: Negative for visual disturbance.  Respiratory: Positive for apnea and shortness of breath. Negative for cough, chest tightness, wheezing and stridor.   Cardiovascular: Positive for leg swelling. Negative for chest pain and palpitations.  Gastrointestinal: Negative for abdominal pain, constipation, diarrhea, nausea and vomiting.  Endocrine: Negative.   Genitourinary: Negative.  Negative for difficulty urinating, penile swelling, scrotal swelling,  testicular pain and urgency.  Musculoskeletal: Negative.  Negative for arthralgias, back pain, myalgias and neck pain.  Skin: Negative.  Negative for color change, pallor and rash.  Neurological: Negative.  Negative for dizziness, weakness and light-headedness.  Hematological: Negative for adenopathy. Does not bruise/bleed easily.  Psychiatric/Behavioral: Negative.     Objective:  BP (!) 160/90 (BP Location: Left Arm, Patient Position: Sitting, Cuff Size: Large)   Pulse 67   Temp 97.7 F (36.5 C) (Oral)   Resp 16   Ht 5' 6.5" (1.689 m)   Wt 213 lb 6 oz (96.8 kg)   SpO2 94%   BMI 33.92 kg/m   BP Readings from Last 3 Encounters:  03/13/18 (!) 160/90  03/05/17 (!) 148/78  02/20/16 126/70    Wt Readings from Last 3 Encounters:  03/13/18 213 lb 6 oz (96.8 kg)  03/05/17 197 lb (89.4 kg)  02/20/16 196 lb (88.9 kg)    Physical Exam  Constitutional: He is oriented to person, place, and time. No distress.  HENT:  Mouth/Throat: Oropharynx is clear and moist. No oropharyngeal exudate.  Eyes: Conjunctivae are normal. No scleral icterus.  Neck: Normal range of motion. Neck supple. No JVD present. No thyromegaly present.  Cardiovascular: Regular rhythm, S1 normal and S2 normal. Bradycardia present. Exam reveals no gallop.  No murmur heard. EKG ---  Sinus  Bradycardia  WITHIN NORMAL LIMITS   Pulmonary/Chest: Effort normal and breath sounds normal. No respiratory distress. He has no wheezes. He has no rales.  Abdominal: Soft. Bowel sounds are normal. He exhibits no mass. There is no tenderness. Hernia confirmed negative in the right inguinal area and confirmed negative in the left inguinal area.  Genitourinary: Rectum normal and penis normal. Rectal exam shows no external hemorrhoid, no internal hemorrhoid, no fissure, no mass, no tenderness, anal tone normal and guaiac negative stool. Prostate is enlarged (2+ smooth symm BPH). Prostate is not tender. Cremasteric reflex is present.  Right testis shows no mass, no swelling and no tenderness. Left testis shows no mass, no swelling and no tenderness. Circumcised. No penile erythema or penile tenderness. No discharge found.  Musculoskeletal: He exhibits edema (1+ pitting edema in BLE). He exhibits no tenderness or deformity.  Lymphadenopathy:    He has no cervical adenopathy. No inguinal adenopathy noted on the right or left side.  Neurological: He is alert and oriented to person, place, and time.  Skin: Skin is warm and dry. No rash noted. He is not diaphoretic. No erythema. No pallor.  Vitals reviewed.   Lab Results  Component Value Date   WBC 8.2 03/13/2018   HGB 15.2 03/13/2018   HCT 44.5 03/13/2018   PLT 204.0 03/13/2018   GLUCOSE 94 03/13/2018   CHOL 209 (H) 03/13/2018   TRIG 198.0 (H) 03/13/2018   HDL 57.40 03/13/2018   LDLDIRECT 121.6 12/03/2011   LDLCALC 112 (H) 03/13/2018   ALT 31 03/13/2018   AST 20 03/13/2018   NA 140 03/13/2018   K 4.1 03/13/2018   CL 100 03/13/2018   CREATININE 0.89 03/13/2018   BUN 11 03/13/2018   CO2 31 03/13/2018   TSH 0.50 03/13/2018   PSA 0.23 03/13/2018   HGBA1C 5.9 03/13/2018    Dg Lumbar Spine Complete  Result Date: 05/22/2013 CLINICAL DATA:  62 year old male with left-sided low back pain. Lifting injury. EXAM: LUMBAR SPINE - COMPLETE 4+ VIEW COMPARISON:  Abdominal radiographs 05/07/2010. CT Abdomen and Pelvis 08/20/2006. FINDINGS: Normal lumbar segmentation. Stable vertebral height and alignment. Mild progression of degenerative endplate spurring since 2006/01/17, most pronounced at L2-L3. Still, disc spaces are relatively preserved. No pars fracture. Mild L5-S1 facet hypertrophy. Lower thoracic levels appear intact. Sacral a ala and SI joints within normal limits. IMPRESSION: No acute osseous abnormality in the lumbar spine. Electronically Signed   By: Lars Pinks   On: 05/22/2013 10:15    Assessment & Plan:   Guillermo was seen today for annual exam and  hypertension.  Diagnoses and all orders for this visit:   Essential hypertension- His blood pressure is not well controlled.  His EKG is negative for LVH or ischemia.  I will treat the vitamin D deficiency.  I have asked him to have his sleep apnea evaluated and treated.  I will screen him for hyperaldosteronism.  Otherwise his labs are negative for secondary causes or endorgan damage.  I will try to gain better blood pressure control by changing from an ACEI to an ARB and starting a thiazide diuretic. -     CBC with Differential/Platelet; Future -     Aldosterone + renin activity w/ ratio; Future -     Comprehensive metabolic panel; Future -     Urinalysis, Routine w reflex microscopic; Future -     TSH; Future -     VITAMIN D 25 Hydroxy (Vit-D Deficiency, Fractures); Future -     Azilsartan-Chlorthalidone (EDARBYCLOR) 40-12.5 MG TABS; Take 1 tablet by mouth daily.  Routine general medical examination at a health care facility- Exam completed, labs reviewed,  vaccines reviewed, screening for colon cancer is up-to-date, patient education material was given. -     Lipid panel; Future -     PSA; Future  Other abnormal glucose - his A1c is at 5.9%.  He is prediabetic.  Medical therapy is not indicated.  He was encouraged to improve his lifestyle modifications. -     Hemoglobin A1c; Future  DOE (dyspnea on exertion)- He has chronic, unchanged DOE and a history of a normal ETT.  His EKG today is negative for LVH or ischemia.  I do not think the DOE is related to cardiac ischemia but most likely related to poor conditioning and obesity.  He was encouraged to exercise more. -     EKG 12-Lead  OSA (obstructive sleep apnea) -     Ambulatory referral to Sleep Studies  Vitamin D deficiency disease -     Cholecalciferol (VITAMIN D3) 50000 units TABS; Take 1 tablet by mouth once a week.  Dyslipidemia, goal LDL below 100- He has a moderately elevated ASCVD risk score so have asked him to start taking a  statin and baby aspirin a day for CV risk reduction. -     Pitavastatin Calcium (LIVALO) 1 MG TABS; Take 1 tablet (1 mg total) by mouth daily. -     aspirin 81 MG tablet; Take 1 tablet (81 mg total) by mouth daily.   I have discontinued Victorio Popowski's folic acid and amLODipine-benazepril. I have also changed his aspirin. Additionally, I am having him start on Azilsartan-Chlorthalidone, Vitamin D3, and Pitavastatin Calcium. Lastly, I am having him maintain his fluticasone, levocetirizine, finasteride, and doxazosin.  Meds ordered this encounter  Medications  . Azilsartan-Chlorthalidone (EDARBYCLOR) 40-12.5 MG TABS    Sig: Take 1 tablet by mouth daily.    Dispense:  56 tablet    Refill:  0  . Cholecalciferol (VITAMIN D3) 50000 units TABS    Sig: Take 1 tablet by mouth once a week.    Dispense:  12 tablet    Refill:  1  . Pitavastatin Calcium (LIVALO) 1 MG TABS    Sig: Take 1 tablet (1 mg total) by mouth daily.    Dispense:  90 tablet    Refill:  1  . aspirin 81 MG tablet    Sig: Take 1 tablet (81 mg total) by mouth daily.    Dispense:  90 tablet    Refill:  1     Follow-up: Return in about 6 weeks (around 04/24/2018).  Scarlette Calico, MD

## 2018-03-13 NOTE — Patient Instructions (Signed)

## 2018-03-14 ENCOUNTER — Encounter: Payer: Self-pay | Admitting: Internal Medicine

## 2018-03-14 DIAGNOSIS — E559 Vitamin D deficiency, unspecified: Secondary | ICD-10-CM | POA: Insufficient documentation

## 2018-03-14 MED ORDER — VITAMIN D3 1.25 MG (50000 UT) PO TABS: 1.0000 | ORAL_TABLET | ORAL | 1 refills | Status: DC

## 2018-03-14 MED ORDER — PITAVASTATIN CALCIUM 1 MG PO TABS: 1.0000 | ORAL_TABLET | Freq: Every day | ORAL | 1 refills | Status: DC

## 2018-03-14 MED ORDER — ASPIRIN 81 MG PO TABS: 81.0000 mg | ORAL_TABLET | Freq: Every day | ORAL | 1 refills | Status: DC

## 2018-03-18 ENCOUNTER — Other Ambulatory Visit: Payer: Self-pay | Admitting: Internal Medicine

## 2018-03-18 DIAGNOSIS — E785 Hyperlipidemia, unspecified: Secondary | ICD-10-CM

## 2018-03-18 LAB — ALDOSTERONE + RENIN ACTIVITY W/ RATIO
ALDO / PRA RATIO: 4.7 ratio (ref 0.9–28.9)
Aldosterone: 2 ng/dL
Renin Activity: 0.43 ng/mL/h (ref 0.25–5.82)

## 2018-03-18 MED ORDER — PRAVASTATIN SODIUM 20 MG PO TABS: 20.0000 mg | ORAL_TABLET | Freq: Every day | ORAL | 1 refills | Status: DC

## 2018-03-25 ENCOUNTER — Encounter (HOSPITAL_COMMUNITY): Payer: Self-pay | Admitting: Emergency Medicine

## 2018-03-25 ENCOUNTER — Ambulatory Visit (HOSPITAL_COMMUNITY)
Admission: EM | Admit: 2018-03-25 | Discharge: 2018-03-25 | Disposition: A | Discharge status: Home / Self Care | Payer: Managed Care, Other (non HMO) | Attending: Family Medicine | Admitting: Family Medicine

## 2018-03-25 DIAGNOSIS — M25522 Pain in left elbow: Secondary | ICD-10-CM | POA: Diagnosis not present

## 2018-03-25 MED ORDER — IBUPROFEN 800 MG PO TABS: 800.0000 mg | ORAL_TABLET | Freq: Three times a day (TID) | ORAL | 0 refills | Status: DC

## 2018-03-25 NOTE — ED Triage Notes (Signed)
PT reports elbow pain that started last night. No injury

## 2018-03-25 NOTE — Discharge Instructions (Signed)
Use anti-inflammatories for pain/swelling. You may take up to 800 mg Ibuprofen every 8 hours with food. You may supplement Ibuprofen with Tylenol [phone] mg every 8 hours.   Ice area multiple times a day to help with inflammation and swelling  Please follow up with PCP or Orthopedics for further treatment/evaluation

## 2018-03-25 NOTE — ED Provider Notes (Signed)
[facility]    CSN: [id number] Arrival date & time: 03/25/18  1040     History   Chief Complaint Chief Complaint  Patient presents with  . Arm Injury    HPI Careem Yasui is a 62 y.o. male presenting today for evaluation of left elbow pain.  He denies any injury.  Symptoms began yesterday.  He notes he does a lot of heavy lifting at work and noticed his pain yesterday while he was mowing the grass.  Denies any specific injury while mowing the grass.  Endorsing pain with lifting and certain movements.  Noticing pain mainly on anterior aspect of elbow and antecubital area.  Takes aspirin daily, but has not added any other medications for pain.  Denies numbness or tingling.  Occasionally will have shoulder pain, but no specific injury of the shoulder either.  HPI  Past Medical History:  Diagnosis Date  . Allergy   . Arthritis   . GERD (gastroesophageal reflux disease)   . History of nephrolithiasis   . Hyperlipidemia   . Hypertension     Patient Active Problem List   Diagnosis Date Noted  . Vitamin D deficiency disease 03/14/2018  . DOE (dyspnea on exertion) 02/20/2016  . Allergic rhinitis 02/20/2016  . Hematuria 01/23/2014  . Other abnormal glucose 01/22/2014  . Low back pain 05/22/2013  . Herniated lumbar intervertebral disc 05/22/2013  . Routine general medical examination at a health care facility 12/03/2011  . OSA (obstructive sleep apnea) 02/14/2011  . Dyslipidemia, goal LDL below 100 10/11/2008  . Essential hypertension 10/11/2008  . GERD 10/11/2008  . BPH with obstruction/lower urinary tract symptoms 10/11/2008    Past Surgical History:  Procedure Laterality Date  . KIDNEY STONE SURGERY    . TOOTH EXTRACTION         Home Medications    Prior to Admission medications   Medication Sig Start Date End Date Taking? Authorizing Provider  aspirin 81 MG tablet Take 1 tablet (81 mg total) by mouth daily. 03/14/18   Janith Lima, MD    Azilsartan-Chlorthalidone (EDARBYCLOR) 40-12.5 MG TABS Take 1 tablet by mouth daily. 03/13/18   Janith Lima, MD  Cholecalciferol (VITAMIN D3) 50000 units TABS Take 1 tablet by mouth once a week. 03/14/18   Janith Lima, MD  doxazosin (CARDURA) 2 MG tablet TAKE 1 TABLET (2 MG TOTAL) BY MOUTH DAILY. 02/24/18   Janith Lima, MD  finasteride (PROSCAR) 5 MG tablet TAKE 1 TABLET BY MOUTH EVERY DAY 02/24/18   Janith Lima, MD  fluticasone [facility]) 50 MCG/ACT nasal spray Place 1 spray into both nostrils daily.    [provider]  ibuprofen (ADVIL,MOTRIN) 800 MG tablet Take 1 tablet (800 mg total) by mouth 3 (three) times daily. 03/25/18   Luciana Cammarata C, PA-C  levocetirizine (XYZAL) 5 MG tablet Take 1 tablet (5 mg total) by mouth every evening. 02/13/18   Janith Lima, MD  pravastatin (PRAVACHOL) 20 MG tablet Take 1 tablet (20 mg total) by mouth daily. 03/18/18   Janith Lima, MD    Family History Family History  Problem Relation Age of Onset  . Parkinson's disease Sister   . Arthritis Other   . Diabetes Other   . Hyperlipidemia Other   . Hypertension Other   . Early death Neg Hx   . COPD Neg Hx   . Cancer Neg Hx   . Asthma Neg Hx   . Alcohol abuse Neg Hx   .  Heart disease Neg Hx   . Stroke Neg Hx   . Kidney disease Neg Hx     Social History Social History   Tobacco Use  . Smoking status: Former Smoker    Packs/day: 1.00    Years: 15.00    Pack years: 15.00    Types: Cigarettes  . Smokeless tobacco: Former Systems developer    Types: Chew    Quit date: 10/01/1978  . Tobacco comment: occ smokes 1 cigar per year.  Substance Use Topics  . Alcohol use: Yes    Alcohol/week: 1.2 oz    Types: 2 Cans of beer per week  . Drug use: No     Allergies   Patient has no known allergies.   Review of Systems Review of Systems  Constitutional: Negative for activity change, chills, diaphoresis and fatigue.  Eyes: Negative for visual disturbance.  Respiratory: Negative for  chest tightness and shortness of breath.   Cardiovascular: Negative for chest pain and leg swelling.  Gastrointestinal: Negative for nausea and vomiting.  Musculoskeletal: Positive for arthralgias and myalgias. Negative for back pain, gait problem, neck pain and neck stiffness.  Skin: Negative for color change and wound.  Neurological: Negative for dizziness, weakness, light-headedness, numbness and headaches.     Physical Exam Triage Vital Signs ED Triage Vitals [03/25/18 1127]  Enc Vitals Group     BP (!) 165/79     Pulse Rate 79     Resp 16     Temp 98.4 F (36.9 C)     Temp Source Oral     SpO2 97 %     Weight 213 lb (96.6 kg)     Height      Head Circumference      Peak Flow      Pain Score 0     Pain Loc      Pain Edu?      Excl. in [place]?    No data found.  Updated Vital Signs BP (!) 165/79   Pulse 79   Temp 98.4 F (36.9 C) (Oral)   Resp 16   Wt 213 lb (96.6 kg)   SpO2 97%   BMI 33.86 kg/m   Visual Acuity Right Eye Distance:   Left Eye Distance:   Bilateral Distance:    Right Eye Near:   Left Eye Near:    Bilateral Near:     Physical Exam  Constitutional: He appears well-developed and well-nourished.  HENT:  Head: Normocephalic and atraumatic.  Eyes: Conjunctivae are normal.  Neck: Neck supple.  Cardiovascular: Normal rate.  No murmur heard. Pulmonary/Chest: Effort normal. No respiratory distress.  Abdominal: Soft. There is no tenderness.  Musculoskeletal: He exhibits no edema.  Left elbow:, No obvious deformity, tenderness to palpation over left antecubital area, nontender to palpation over entire radius and ulna, nontender to palpation of lateral and medial epicondyle.  Full active range of motion with full flexion extension.  Strength 5/5 with flexion and extension resisted.  Full active range of motion at shoulder.  Neurological: He is alert.  Skin: Skin is warm and dry.  Psychiatric: He has a normal mood and affect.  Nursing note and vitals  reviewed.    UC Treatments / Results  Labs (all labs ordered are listed, but only abnormal results are displayed) Labs Reviewed - No data to display  EKG None  Radiology No results found.  Procedures Procedures (including critical care time)  Medications Ordered in UC Medications - No data to display  Initial Impression / Assessment and Plan / UC Course  I have reviewed the triage vital signs and the nursing notes.  Pertinent labs & imaging results that were available during my care of the patient were reviewed by me and considered in my medical decision making (see chart for details).     Patient likely with biceps tendinitis, will defer imaging given no injury and nontender over bony aspects.  Will recommend conservative treatment with anti-inflammatories, rest and ice.Discussed strict return precautions. Patient verbalized understanding and is agreeable with plan.  Final Clinical Impressions(s) / UC Diagnoses   Final diagnoses:  Left elbow pain     Discharge Instructions     Use anti-inflammatories for pain/swelling. You may take up to 800 mg Ibuprofen every 8 hours with food. You may supplement Ibuprofen with Tylenol [phone] mg every 8 hours.   Ice area multiple times a day to help with inflammation and swelling  Please follow up with PCP or Orthopedics for further treatment/evaluation    ED Prescriptions    Medication Sig Dispense Auth. Provider   ibuprofen (ADVIL,MOTRIN) 800 MG tablet Take 1 tablet (800 mg total) by mouth 3 (three) times daily. 30 tablet Ardie Dragoo, [place] C, PA-C     Controlled Substance Prescriptions [place] Controlled Substance Registry consulted? Not Applicable   Janith Lima, [place] 03/25/18 1156

## 2018-04-24 ENCOUNTER — Encounter: Payer: Self-pay | Admitting: Internal Medicine

## 2018-04-24 ENCOUNTER — Ambulatory Visit (INDEPENDENT_AMBULATORY_CARE_PROVIDER_SITE_OTHER): Payer: Managed Care, Other (non HMO) | Admitting: Internal Medicine

## 2018-04-24 ENCOUNTER — Other Ambulatory Visit (INDEPENDENT_AMBULATORY_CARE_PROVIDER_SITE_OTHER): Payer: Managed Care, Other (non HMO)

## 2018-04-24 VITALS — BP 140/74 | HR 67 | Temp 97.9°F | Ht 66.5 in | Wt 205.8 lb

## 2018-04-24 DIAGNOSIS — I10 Essential (primary) hypertension: Secondary | ICD-10-CM

## 2018-04-24 DIAGNOSIS — E876 Hypokalemia: Secondary | ICD-10-CM | POA: Diagnosis not present

## 2018-04-24 DIAGNOSIS — T502X5A Adverse effect of carbonic-anhydrase inhibitors, benzothiadiazides and other diuretics, initial encounter: Secondary | ICD-10-CM

## 2018-04-24 LAB — BASIC METABOLIC PANEL
BUN: 14 mg/dL (ref 6–23)
CALCIUM: 9.5 mg/dL (ref 8.4–10.5)
CO2: 29 meq/L (ref 19–32)
CREATININE: 0.95 mg/dL (ref 0.40–1.50)
Chloride: 98 mEq/L (ref 96–112)
GFR: 85.85 mL/min (ref >60.00)
Glucose, Bld: 108 mg/dL — ABNORMAL HIGH (ref 70–99)
Potassium: 3.4 mEq/L — ABNORMAL LOW (ref 3.5–5.1)
Sodium: 137 mEq/L (ref 135–145)

## 2018-04-24 MED ORDER — POTASSIUM CHLORIDE ER 10 MEQ PO TBCR: 10.0000 meq | EXTENDED_RELEASE_TABLET | Freq: Three times a day (TID) | ORAL | 1 refills | Status: DC

## 2018-04-24 MED ORDER — AZILSARTAN-CHLORTHALIDONE 40-12.5 MG PO TABS
1.0000 | ORAL_TABLET | Freq: Every day | ORAL | 1 refills | Status: DC
Start: 2018-04-24 — End: 2018-07-11

## 2018-04-24 NOTE — Progress Notes (Signed)
Subjective:  [name], male    DOB: 07-May-1956  Age: 62 y.o. MRN: [id number]  CC: Hypertension   HPI Steven Hickman presents for a BP check - He tells me his blood pressure has been well controlled.  He is compliant with the antihypertensives.  He feels well and offers no complaints.  Outpatient Medications Prior to Visit  Medication Sig Dispense Refill  . aspirin 81 MG tablet Take 1 tablet (81 mg total) by mouth daily. 90 tablet 1  . Cholecalciferol (VITAMIN D3) 50000 units TABS Take 1 tablet by mouth once a week. 12 tablet 1  . doxazosin (CARDURA) 2 MG tablet TAKE 1 TABLET (2 MG TOTAL) BY MOUTH DAILY. 90 tablet 0  . finasteride (PROSCAR) 5 MG tablet TAKE 1 TABLET BY MOUTH EVERY DAY 90 tablet 0  . fluticasone (FLONASE) 50 MCG/ACT nasal spray Place 1 spray into both nostrils daily.    Marland Kitchen ibuprofen (ADVIL,MOTRIN) 800 MG tablet Take 1 tablet (800 mg total) by mouth 3 (three) times daily. 30 tablet 0  . levocetirizine (XYZAL) 5 MG tablet Take 1 tablet (5 mg total) by mouth every evening. 90 tablet 3  . pravastatin (PRAVACHOL) 20 MG tablet Take 1 tablet (20 mg total) by mouth daily. 90 tablet 1  . Azilsartan-Chlorthalidone (EDARBYCLOR) 40-12.5 MG TABS Take 1 tablet by mouth daily. 56 tablet 0   No facility-administered medications prior to visit.     ROS Review of Systems  Constitutional: Negative for appetite change, diaphoresis and fatigue.  HENT: Negative.   Eyes: Negative for visual disturbance.  Respiratory: Negative for cough, chest tightness, shortness of breath and wheezing.   Cardiovascular: Negative.  Negative for chest pain, palpitations and leg swelling.  Gastrointestinal: Negative for abdominal pain, diarrhea and nausea.  Endocrine: Negative.   Genitourinary: Negative.  Negative for difficulty urinating.  Musculoskeletal: Negative.  Negative for arthralgias and myalgias.  Skin: Negative.   Neurological: Negative.  Negative for dizziness, weakness and  light-headedness.  Hematological: Negative for adenopathy. Does not bruise/bleed easily.  Psychiatric/Behavioral: Negative.     Objective:  BP 140/74 (BP Location: Left Arm, Patient Position: Sitting, Cuff Size: Large)   Pulse 67   Temp 97.9 F (36.6 C) (Oral)   Ht 5' 6.5" (1.689 m)   Wt 205 lb 12 oz (93.3 kg)   SpO2 95%   BMI 32.71 kg/m   BP Readings from Last 3 Encounters:  04/24/18 140/74  03/25/18 (!) 165/79  03/13/18 (!) 160/90    Wt Readings from Last 3 Encounters:  04/24/18 205 lb 12 oz (93.3 kg)  03/25/18 213 lb (96.6 kg)  03/13/18 213 lb 6 oz (96.8 kg)    Physical Exam  Constitutional: He is oriented to person, place, and time. No distress.  HENT:  Mouth/Throat: Oropharynx is clear and moist. No oropharyngeal exudate.  Eyes: Conjunctivae are normal. No scleral icterus.  Neck: Normal range of motion. Neck supple. No JVD present. No thyromegaly present.  Cardiovascular: Normal rate, regular rhythm and normal heart sounds.  Pulmonary/Chest: Effort normal and breath sounds normal. He has no wheezes. He has no rales.  Abdominal: Soft. Normal appearance and bowel sounds are normal. He exhibits no mass. There is no hepatosplenomegaly. There is no tenderness.  Musculoskeletal: Normal range of motion. He exhibits no edema, tenderness or deformity.  Lymphadenopathy:    He has no cervical adenopathy.  Neurological: He is alert and oriented to person, place, and time.  Skin: Skin is dry. No rash noted.  He is not diaphoretic.  Vitals reviewed.   Lab Results  Component Value Date   WBC 8.2 03/13/2018   HGB 15.2 03/13/2018   HCT 44.5 03/13/2018   PLT 204.0 03/13/2018   GLUCOSE 108 (H) 04/24/2018   CHOL 209 (H) 03/13/2018   TRIG 198.0 (H) 03/13/2018   HDL 57.40 03/13/2018   LDLDIRECT 121.6 12/03/2011   LDLCALC 112 (H) 03/13/2018   ALT 31 03/13/2018   AST 20 03/13/2018   NA 137 04/24/2018   K 3.4 (L) 04/24/2018   CL 98 04/24/2018   CREATININE 0.95 04/24/2018    BUN 14 04/24/2018   CO2 29 04/24/2018   TSH 0.50 03/13/2018   PSA 0.23 03/13/2018   HGBA1C 5.9 03/13/2018    No results found.  Assessment & Plan:   Steven Hickman was seen today for hypertension.  Diagnoses and all orders for this visit:  Essential hypertension- His blood pressure is adequately well controlled.  He has developed hypokalemia.  Will continue the current antihypertensive regimen and will add on a potassium supplement as well. -     Basic metabolic panel; Future -     Azilsartan-Chlorthalidone (EDARBYCLOR) 40-12.5 MG TABS; Take 1 tablet by mouth daily. -     potassium chloride (K-DUR) 10 MEQ tablet; Take 1 tablet (10 mEq total) by mouth 3 (three) times daily.  Diuretic-induced hypokalemia -     potassium chloride (K-DUR) 10 MEQ tablet; Take 1 tablet (10 mEq total) by mouth 3 (three) times daily.   I am having Steven Hickman "[name]" start on potassium chloride. I am also having him maintain his fluticasone, levocetirizine, finasteride, doxazosin, Vitamin D3, aspirin, pravastatin, ibuprofen, and Azilsartan-Chlorthalidone.  Meds ordered this encounter  Medications  . Azilsartan-Chlorthalidone (EDARBYCLOR) 40-12.5 MG TABS    Sig: Take 1 tablet by mouth daily.    Dispense:  90 tablet    Refill:  1  . potassium chloride (K-DUR) 10 MEQ tablet    Sig: Take 1 tablet (10 mEq total) by mouth 3 (three) times daily.    Dispense:  270 tablet    Refill:  1     Follow-up: Return in about 6 months (around 10/25/2018).  Scarlette Calico, MD

## 2018-04-24 NOTE — Patient Instructions (Signed)

## 2018-05-26 ENCOUNTER — Other Ambulatory Visit: Payer: Self-pay | Admitting: Internal Medicine

## 2018-05-26 DIAGNOSIS — N138 Other obstructive and reflux uropathy: Secondary | ICD-10-CM

## 2018-05-26 DIAGNOSIS — N401 Enlarged prostate with lower urinary tract symptoms: Principal | ICD-10-CM

## 2018-06-03 ENCOUNTER — Ambulatory Visit (INDEPENDENT_AMBULATORY_CARE_PROVIDER_SITE_OTHER): Payer: Managed Care, Other (non HMO) | Admitting: Neurology

## 2018-06-03 ENCOUNTER — Encounter: Payer: Self-pay | Admitting: Neurology

## 2018-06-03 VITALS — BP 115/71 | HR 67 | Ht 66.0 in | Wt 193.0 lb

## 2018-06-03 DIAGNOSIS — R0683 Snoring: Secondary | ICD-10-CM

## 2018-06-03 DIAGNOSIS — G4730 Sleep apnea, unspecified: Secondary | ICD-10-CM

## 2018-06-03 DIAGNOSIS — G4719 Other hypersomnia: Secondary | ICD-10-CM | POA: Diagnosis not present

## 2018-06-03 DIAGNOSIS — E669 Obesity, unspecified: Secondary | ICD-10-CM | POA: Diagnosis not present

## 2018-06-03 DIAGNOSIS — R634 Abnormal weight loss: Secondary | ICD-10-CM

## 2018-06-03 NOTE — Patient Instructions (Signed)

## 2018-06-03 NOTE — Progress Notes (Signed)
Subjective:    [name] is a 62 y.o. male.  HPI     Star Age, MD, PhD [facility] [address], [address], [place] [address]  Dear Dr. Ronnald Ramp,   I saw your patient, Steven Hickman, upon your kind request in my neurologic clinic today for initial consultation of his sleep disturbance, in particular, concern for underlying obstructive sleep apnea and re-evaluation thereof. The patient is unaccompanied today. As you know, Steven Hickman is a 62 year old right-handed gentleman with an underlying medical history of hypertension, hyperlipidemia, reflux disease, arthritis, allergies, history of kidney stones, and obesity, who reports snoring and excessive daytime somnolence as well as a prior diagnosis of OSA. I reviewed your office note from 03/13/2018. His Epworth sleepiness score is 13 out of 24 today, fatigue score is 28 out of 63. He lives with his wife and his mother-in-law. He does not smoke cigarettes any longer, but smokes a cigar about 2 per month on average. He drinks alcohol in the form of beer, about 2 per day on the weekend, likes to drink caffeine in the form of diet soda, tea and energy drinks.  He had a sleep study in June 2012 which showed severe obstructive sleep apnea with an AHI of 84 per hour, desaturation nadir of 60%. His weight was around 222 at the time. He is been trying to lose weight. Current weight of 193. He tried CPAP briefly, he had trouble tolerating the full facemask. He went back for a mask change or refill but was told that this was the best mask he would be able to use. He finally gave up on using CPAP altogether. He has talked to his dentist about potentially getting a dental device and he would be amenable for sleep study testing and considering either a dental device or CPAP again if needed. His bedtime is around 8, he typically likes to watch TV while in bed for an hour in terms of TV around 9. His rise time is  around 4. He works for [employer]. He has nocturia about once or twice per average night, does not have recurrent morning headaches. He is not aware of any sleep apnea in the family. He has 2 grown children, one 40-year-old granddaughter.  His Past Medical History Is Significant For: Past Medical History:  Diagnosis Date  . Allergy   . Arthritis   . GERD (gastroesophageal reflux disease)   . History of nephrolithiasis   . Hyperlipidemia   . Hypertension     His Past Surgical History Is Significant For: Past Surgical History:  Procedure Laterality Date  . KIDNEY STONE SURGERY    . TOOTH EXTRACTION      His Family History Is Significant For: Family History  Problem Relation Age of Onset  . Parkinson's disease Sister   . Arthritis Other   . Diabetes Other   . Hyperlipidemia Other   . Hypertension Other   . Early death Neg Hx   . COPD Neg Hx   . Cancer Neg Hx   . Asthma Neg Hx   . Alcohol abuse Neg Hx   . Heart disease Neg Hx   . Stroke Neg Hx   . Kidney disease Neg Hx     His Social History Is Significant For: Social History   Socioeconomic History  . Marital status: Married    Spouse name: Steven Hickman  . Number of children: Y  . Years of education: Not on file  .  Highest education level: Not on file  Occupational History  . Occupation: Management consultant  Social Needs  . Financial resource strain: Not on file  . Food insecurity:    Worry: Not on file    Inability: Not on file  . Transportation needs:    Medical: Not on file    Non-medical: Not on file  Tobacco Use  . Smoking status: Former Smoker    Packs/day: 1.00    Years: 15.00    Pack years: 15.00    Types: Cigarettes  . Smokeless tobacco: Former Systems developer    Types: Chew    Quit date: 10/01/1978  . Tobacco comment: occ smokes 1 cigar per year.  Substance and Sexual Activity  . Alcohol use: Yes    Alcohol/week: 2.0 standard drinks    Types: 2 Cans of beer per week  . Drug use: No  . Sexual activity: Yes   Lifestyle  . Physical activity:    Days per week: Not on file    Minutes per session: Not on file  . Stress: Not on file  Relationships  . Social connections:    Talks on phone: Not on file    Gets together: Not on file    Attends religious service: Not on file    Active member of club or organization: Not on file    Attends meetings of clubs or organizations: Not on file    Relationship status: Not on file  Other Topics Concern  . Not on file  Social History Narrative  . Not on file    His Allergies Are:  No Known Allergies:   His Current Medications Are:  Outpatient Encounter Medications as of 06/03/2018  Medication Sig  . aspirin 81 MG tablet Take 1 tablet (81 mg total) by mouth daily.  . Azilsartan-Chlorthalidone (EDARBYCLOR) 40-12.5 MG TABS Take 1 tablet by mouth daily.  . Cholecalciferol (VITAMIN D3) 50000 units TABS Take 1 tablet by mouth once a week.  . doxazosin (CARDURA) 2 MG tablet TAKE 1 TABLET (2 MG TOTAL) BY MOUTH DAILY.  . finasteride (PROSCAR) 5 MG tablet TAKE 1 TABLET BY MOUTH EVERY DAY  . fluticasone (FLONASE) 50 MCG/ACT nasal spray Place 1 spray into both nostrils daily.  Marland Kitchen ibuprofen (ADVIL,MOTRIN) 800 MG tablet Take 1 tablet (800 mg total) by mouth 3 (three) times daily.  Marland Kitchen levocetirizine (XYZAL) 5 MG tablet Take 1 tablet (5 mg total) by mouth every evening.  . potassium chloride (K-DUR) 10 MEQ tablet Take 1 tablet (10 mEq total) by mouth 3 (three) times daily.  . pravastatin (PRAVACHOL) 20 MG tablet Take 1 tablet (20 mg total) by mouth daily.   No facility-administered encounter medications on file as of 06/03/2018.   :  Review of Systems:  Out of a complete 14 point review of systems, all are reviewed and negative with the exception of these symptoms as listed below: Review of Systems  Neurological:       Pt mentioned that he snores but he isn't sure if it happens every night. Pt mentioned that his wife will shake or nudge him in the middle of night  telling him to turn over.   Epworth Sleepiness Scale 0= would never doze 1= slight chance of dozing 2= moderate chance of dozing 3= high chance of dozing  Sitting and reading:3 Watching TV:2 Sitting inactive in a public place (ex. Theater or meeting):2 As a passenger in a car for an hour without a break:2 Lying down to rest  in the afternoon:3 Sitting and talking to someone:0 Sitting quietly after lunch (no alcohol):1 In a car, while stopped in traffic:0 Total:13     Objective:  Neurological Exam  Physical Exam Physical Examination:   Vitals:   06/03/18 1545  BP: 115/71  Pulse: 67    General Examination: The patient is a very pleasant 63 y.o. male in no acute distress. He appears well-developed and well-nourished and well groomed.   HEENT: Normocephalic, atraumatic, pupils are equal, round and reactive to light and accommodation. Extraocular tracking is good without limitation to gaze excursion or nystagmus noted. Normal smooth pursuit is noted. Hearing is grossly intact. Face is symmetric with normal facial animation and normal facial sensation. Speech is clear with no dysarthria noted. There is no hypophonia. There is no lip, neck/head, jaw or voice tremor. Neck is supple with full range of passive and active motion. Oropharynx exam reveals: mild mouth dryness, adequate dental hygiene and moderate airway crowding, due to smaller airway entry and wider uvula.. Mallampati is class II. Tongue protrudes centrally and palate elevates symmetrically. Tonsils are smaller. Neck size is 18.5 inches. He has a Mild overbite.   Chest: Clear to auscultation without wheezing, rhonchi or crackles noted.  Heart: S1+S2+0, regular and normal without murmurs, rubs or gallops noted.   Abdomen: Soft, non-tender and non-distended with normal bowel sounds appreciated on auscultation.  Extremities: There is no pitting edema in the distal lower extremities bilaterally. Pedal pulses are  intact.  Skin: Warm and dry without trophic changes noted.  Musculoskeletal: exam reveals no obvious joint deformities, tenderness or joint swelling or erythema.   Neurologically:  Mental status: The patient is awake, alert and oriented in all 4 spheres. His immediate and remote memory, attention, language skills and fund of knowledge are appropriate. There is no evidence of aphasia, agnosia, apraxia or anomia. Speech is clear with normal prosody and enunciation. Thought process is linear. Mood is normal and affect is normal.  Cranial nerves II - XII are as described above under HEENT exam. In addition: shoulder shrug is normal with equal shoulder height noted. Motor exam: Normal bulk, strength and tone is noted. There is no drift, tremor or rebound. Romberg is negative. Reflexes are 1+ throughout. Fine motor skills and coordination: intact with normal finger taps, normal hand movements, normal rapid alternating patting, normal foot taps and normal foot agility.  Cerebellar testing: No dysmetria or intention tremor on finger to nose testing. Heel to shin is unremarkable bilaterally. There is no truncal or gait ataxia.  Sensory exam: intact to LT.   Gait, station and balance: He stands easily. No veering to one side is noted. No leaning to one side is noted. Posture is age-appropriate and stance is narrow based. Gait shows normal stride length and normal pace. No problems turning are noted. Tandem walk is unremarkable.   Assessment and Plan:  In summary, Adolphus Hanf is a very pleasant 62 y.o.-year old male with an underlying medical history of hypertension, hyperlipidemia, reflux disease, arthritis, allergies, history of kidney stones, and obesity, who was previously diagnosed with severe obstructive sleep apnea and could not tolerate CPAP at the time. He presents for reevaluation. He would be willing to consider CPAP therapy if needed. He has been working on weight loss and has lost weight compared to  when he had his sleep study in June 2012.  I had a long chat with the patient about my findings and the diagnosis of OSA, its prognosis and treatment options. We  talked about medical treatments, surgical interventions and non-pharmacological approaches. I explained in particular the risks and ramifications of untreated moderate to severe OSA, especially with respect to developing cardiovascular disease down the Road, including congestive heart failure, difficult to treat hypertension, cardiac arrhythmias, or stroke. Even type 2 diabetes has, in part, been linked to untreated OSA. Symptoms of untreated OSA include daytime sleepiness, memory problems, mood irritability and mood disorder such as depression and anxiety, lack of energy, as well as recurrent headaches, especially morning headaches. We talked about smoking cessation and trying to maintain a healthy lifestyle in general, as well as the importance of weight control. I encouraged the patient to eat healthy, exercise daily and keep well hydrated, to keep a scheduled bedtime and wake time routine, to not skip any meals and eat healthy snacks in between meals. I advised the patient not to drive when feeling sleepy. I recommended the following at this time: sleep study with potential positive airway pressure titration. (We will score hypopneas at 4%).   I explained the sleep test procedure to the patient and also outlined possible surgical and non-surgical treatment options of OSA, including the use of a custom-made dental device (which would require a referral to a specialist dentist or oral surgeon), upper airway surgical options, such as pillar implants, radiofrequency surgery, tongue base surgery, and UPPP (which would involve a referral to an ENT surgeon). Rarely, jaw surgery such as mandibular advancement may be considered.  I also explained the CPAP treatment option to the patient, who indicated that he would be willing to try CPAP if the need arises.  I explained the importance of being compliant with PAP treatment, not only for insurance purposes but primarily to improve His symptoms, and for the patient's long term health benefit, including to reduce His cardiovascular risks. I answered all his questions today and the patient was in agreement. I would like to see him back after the sleep study is completed and encouraged him to call with any interim questions, concerns, problems or updates.   Thank you very much for allowing me to participate in the care of this nice patient. If I can be of any further assistance to you please do not hesitate to call me at [phone].  Sincerely,   Star Age, MD, PhD

## 2018-07-02 ENCOUNTER — Other Ambulatory Visit: Payer: Self-pay | Admitting: Internal Medicine

## 2018-07-02 DIAGNOSIS — N138 Other obstructive and reflux uropathy: Secondary | ICD-10-CM

## 2018-07-02 DIAGNOSIS — N401 Enlarged prostate with lower urinary tract symptoms: Principal | ICD-10-CM

## 2018-07-11 ENCOUNTER — Other Ambulatory Visit: Payer: Self-pay | Admitting: Internal Medicine

## 2018-07-11 DIAGNOSIS — I10 Essential (primary) hypertension: Secondary | ICD-10-CM

## 2018-07-28 ENCOUNTER — Telehealth: Payer: Self-pay

## 2018-07-28 DIAGNOSIS — G4719 Other hypersomnia: Secondary | ICD-10-CM

## 2018-07-28 NOTE — Telephone Encounter (Signed)
[employer] denied in lab sleep study, need HST order

## 2018-07-28 NOTE — Telephone Encounter (Signed)
VO for HST from Dr. Athar received. HST order placed.  

## 2018-08-10 ENCOUNTER — Other Ambulatory Visit: Payer: Self-pay | Admitting: Internal Medicine

## 2018-08-10 DIAGNOSIS — E559 Vitamin D deficiency, unspecified: Secondary | ICD-10-CM

## 2018-09-09 ENCOUNTER — Telehealth: Payer: Self-pay

## 2018-09-09 NOTE — Telephone Encounter (Signed)
We have attempted to call the patient two times to schedule sleep study.  Patient has been unavailable at the phone numbers we have on file and has not returned our calls.  At this point we will send a letter asking patient to please contact the sleep lab to schedule their sleep study.  If patient calls back we will schedule them for their sleep study. 

## 2018-09-17 ENCOUNTER — Other Ambulatory Visit: Payer: Self-pay | Admitting: Internal Medicine

## 2018-09-17 DIAGNOSIS — E785 Hyperlipidemia, unspecified: Secondary | ICD-10-CM

## 2018-10-08 ENCOUNTER — Other Ambulatory Visit: Payer: Self-pay | Admitting: Internal Medicine

## 2018-10-08 DIAGNOSIS — I10 Essential (primary) hypertension: Secondary | ICD-10-CM

## 2018-10-15 ENCOUNTER — Other Ambulatory Visit (INDEPENDENT_AMBULATORY_CARE_PROVIDER_SITE_OTHER): Payer: Managed Care, Other (non HMO)

## 2018-10-15 ENCOUNTER — Other Ambulatory Visit: Payer: Self-pay | Admitting: Internal Medicine

## 2018-10-15 ENCOUNTER — Encounter: Payer: Self-pay | Admitting: Internal Medicine

## 2018-10-15 ENCOUNTER — Ambulatory Visit (INDEPENDENT_AMBULATORY_CARE_PROVIDER_SITE_OTHER): Payer: Managed Care, Other (non HMO) | Admitting: Internal Medicine

## 2018-10-15 ENCOUNTER — Ambulatory Visit (INDEPENDENT_AMBULATORY_CARE_PROVIDER_SITE_OTHER)
Admission: RE | Admit: 2018-10-15 | Discharge: 2018-10-15 | Disposition: A | Discharge status: Home / Self Care | Payer: Managed Care, Other (non HMO) | Source: Ambulatory Visit | Attending: Internal Medicine | Admitting: Internal Medicine

## 2018-10-15 VITALS — BP 142/76 | HR 68 | Temp 97.9°F | Resp 16 | Ht 66.0 in | Wt 196.0 lb

## 2018-10-15 DIAGNOSIS — E785 Hyperlipidemia, unspecified: Secondary | ICD-10-CM

## 2018-10-15 DIAGNOSIS — M5412 Radiculopathy, cervical region: Secondary | ICD-10-CM | POA: Diagnosis not present

## 2018-10-15 DIAGNOSIS — E876 Hypokalemia: Secondary | ICD-10-CM

## 2018-10-15 DIAGNOSIS — E559 Vitamin D deficiency, unspecified: Secondary | ICD-10-CM

## 2018-10-15 DIAGNOSIS — M4802 Spinal stenosis, cervical region: Secondary | ICD-10-CM | POA: Insufficient documentation

## 2018-10-15 DIAGNOSIS — I10 Essential (primary) hypertension: Secondary | ICD-10-CM

## 2018-10-15 DIAGNOSIS — R7309 Other abnormal glucose: Secondary | ICD-10-CM

## 2018-10-15 DIAGNOSIS — R3129 Other microscopic hematuria: Secondary | ICD-10-CM | POA: Diagnosis not present

## 2018-10-15 DIAGNOSIS — T502X5A Adverse effect of carbonic-anhydrase inhibitors, benzothiadiazides and other diuretics, initial encounter: Secondary | ICD-10-CM

## 2018-10-15 LAB — BASIC METABOLIC PANEL
BUN: 12 mg/dL (ref 6–23)
CO2: 32 mEq/L (ref 19–32)
Calcium: 9.6 mg/dL (ref 8.4–10.5)
Chloride: 100 mEq/L (ref 96–112)
Creatinine, Ser: 0.99 mg/dL (ref 0.40–1.50)
GFR: 81.73 mL/min (ref >60.00)
Glucose, Bld: 123 mg/dL — ABNORMAL HIGH (ref 70–99)
POTASSIUM: 3.7 meq/L (ref 3.5–5.1)
Sodium: 139 mEq/L (ref 135–145)

## 2018-10-15 LAB — URINALYSIS, ROUTINE W REFLEX MICROSCOPIC
Bilirubin Urine: NEGATIVE
Hgb urine dipstick: NEGATIVE
Ketones, ur: NEGATIVE
Leukocytes, UA: NEGATIVE
NITRITE: NEGATIVE
RBC / HPF: NONE SEEN (ref 0–?)
Specific Gravity, Urine: 1.01 (ref 1.000–1.030)
Total Protein, Urine: NEGATIVE
Urine Glucose: NEGATIVE
Urobilinogen, UA: 0.2 (ref 0.0–1.0)
pH: 7.5 (ref 5.0–8.0)

## 2018-10-15 LAB — LDL CHOLESTEROL, DIRECT: Direct LDL: 113 mg/dL

## 2018-10-15 LAB — CBC WITH DIFFERENTIAL/PLATELET
Basophils Absolute: 0.1 10*3/uL (ref 0.0–0.1)
Basophils Relative: 1 % (ref 0.0–3.0)
Eosinophils Absolute: 0.1 10*3/uL (ref 0.0–0.7)
Eosinophils Relative: 1.4 % (ref 0.0–5.0)
HEMATOCRIT: 45.7 % (ref 39.0–52.0)
HEMOGLOBIN: 15.5 g/dL (ref 13.0–17.0)
Lymphocytes Relative: 39.8 % (ref 12.0–46.0)
Lymphs Abs: 3.2 10*3/uL (ref 0.7–4.0)
MCHC: 33.9 g/dL (ref 30.0–36.0)
MCV: 91.4 fl (ref 78.0–100.0)
Monocytes Absolute: 0.7 10*3/uL (ref 0.1–1.0)
Monocytes Relative: 8.9 % (ref 3.0–12.0)
Neutro Abs: 4 10*3/uL (ref 1.4–7.7)
Neutrophils Relative %: 48.9 % (ref 43.0–77.0)
Platelets: 233 10*3/uL (ref 150.0–400.0)
RBC: 5 Mil/uL (ref 4.22–5.81)
RDW: 13.3 % (ref 11.5–15.5)
WBC: 8.1 10*3/uL (ref 4.0–10.5)

## 2018-10-15 LAB — LIPID PANEL
Cholesterol: 190 mg/dL (ref 0–200)
HDL: 60.1 mg/dL (ref >39.00)
NonHDL: 130.14
Total CHOL/HDL Ratio: 3
Triglycerides: 218 mg/dL — ABNORMAL HIGH (ref 0.0–149.0)
VLDL: 43.6 mg/dL — AB (ref 0.0–40.0)

## 2018-10-15 LAB — MAGNESIUM: Magnesium: 1.9 mg/dL (ref 1.5–2.5)

## 2018-10-15 LAB — VITAMIN D 25 HYDROXY (VIT D DEFICIENCY, FRACTURES): VITD: 25 ng/mL — ABNORMAL LOW (ref 30.00–100.00)

## 2018-10-15 MED ORDER — PRAVASTATIN SODIUM 20 MG PO TABS: 20.0000 mg | ORAL_TABLET | Freq: Every day | ORAL | 1 refills | Status: DC

## 2018-10-15 NOTE — Patient Instructions (Signed)

## 2018-10-15 NOTE — Progress Notes (Signed)
Subjective:  [name], male    DOB: Aug 30, 1956  Age: 63 y.o. MRN: [id number]  CC: Hypertension   HPI Steven Hickman presents for f/up - He complains of a 60-month history of neck pain that radiates towards both shoulders more to the left shoulder than the right shoulder.  It also radiates in the left upper extremity.  He complains of decreased range of motion in his neck as well.  He denies paresthesias.  He tells me he thinks his blood pressure has been well controlled.  He is very active and denies any recent episodes of CP or DOE.  Outpatient Medications Prior to Visit  Medication Sig Dispense Refill  . aspirin 81 MG tablet Take 1 tablet (81 mg total) by mouth daily. 90 tablet 1  . Cholecalciferol (VITAMIN D3) 1.25 MG (50000 UT) TABS TAKE 1 TABLET BY MOUTH ONE TIME PER WEEK 12 tablet 1  . doxazosin (CARDURA) 2 MG tablet TAKE 1 TABLET (2 MG TOTAL) BY MOUTH DAILY. 90 tablet 1  . EDARBYCLOR 40-12.5 MG TABS TAKE ONE TABLET BY MOUTH DAILY 90 tablet 0  . finasteride (PROSCAR) 5 MG tablet Take 1 tablet (5 mg total) by mouth daily. 90 tablet 1  . fluticasone (FLONASE) 50 MCG/ACT nasal spray Place 1 spray into both nostrils daily.    Marland Kitchen ibuprofen (ADVIL,MOTRIN) 800 MG tablet Take 1 tablet (800 mg total) by mouth 3 (three) times daily. 30 tablet 0  . levocetirizine (XYZAL) 5 MG tablet Take 1 tablet (5 mg total) by mouth every evening. 90 tablet 3  . potassium chloride (K-DUR) 10 MEQ tablet TAKE 1 TABLET (10 MEQ TOTAL) BY MOUTH 3 (THREE) TIMES DAILY. 270 tablet 0  . pravastatin (PRAVACHOL) 20 MG tablet Take 1 tablet (20 mg total) by mouth daily. 90 tablet 1   No facility-administered medications prior to visit.     ROS Review of Systems  Constitutional: Positive for unexpected weight change (wt gain). Negative for diaphoresis and fatigue.  HENT: Negative.   Respiratory: Positive for apnea. Negative for cough, choking, shortness of breath and stridor.   Cardiovascular: Negative for  chest pain, palpitations and leg swelling.  Gastrointestinal: Negative for abdominal pain and constipation.  Genitourinary: Negative.  Negative for difficulty urinating.  Musculoskeletal: Positive for neck pain. Negative for arthralgias, back pain and myalgias.  Skin: Negative for color change and rash.  Neurological: Negative.  Negative for dizziness, weakness, light-headedness and numbness.  Hematological: Negative.  Negative for adenopathy. Does not bruise/bleed easily.  Psychiatric/Behavioral: Negative.     Objective:  BP (!) 142/76 (BP Location: Left Arm, Patient Position: Sitting, Cuff Size: Normal)   Pulse 68   Temp 97.9 F (36.6 C) (Oral)   Resp 16   Ht 5\' 6"  (1.676 m)   Wt 196 lb (88.9 kg)   SpO2 94%   BMI 31.64 kg/m   BP Readings from Last 3 Encounters:  10/15/18 (!) 142/76  06/03/18 115/71  04/24/18 140/74    Wt Readings from Last 3 Encounters:  10/15/18 196 lb (88.9 kg)  06/03/18 193 lb (87.5 kg)  04/24/18 205 lb 12 oz (93.3 kg)    Physical Exam Vitals signs reviewed.  Constitutional:      Appearance: He is obese. He is not ill-appearing.  HENT:     Nose: Nose normal. No congestion or rhinorrhea.     Mouth/Throat:     Mouth: Mucous membranes are moist.     Pharynx: No oropharyngeal exudate or posterior oropharyngeal  erythema.  Eyes:     General: No scleral icterus.    Conjunctiva/sclera: Conjunctivae normal.  Neck:     Thyroid: No thyroid mass or thyromegaly.  Cardiovascular:     Rate and Rhythm: Normal rate and regular rhythm.     Pulses: Normal pulses.     Heart sounds: No murmur. No friction rub. No gallop.   Pulmonary:     Breath sounds: No stridor. No wheezing, rhonchi or rales.  Abdominal:     General: Bowel sounds are normal.     Palpations: There is no hepatomegaly, splenomegaly or mass.     Tenderness: There is no abdominal tenderness. There is no guarding.     Hernia: No hernia is present.  Musculoskeletal: Normal range of motion.         General: No swelling.     Cervical back: He exhibits no tenderness, no bony tenderness, no swelling, no edema and no deformity.     Right lower leg: No edema.     Left lower leg: No edema.  Skin:    General: Skin is warm and dry.     Findings: No erythema.  Neurological:     General: No focal deficit present.     Mental Status: He is oriented to person, place, and time. Mental status is at baseline.     Deep Tendon Reflexes: Reflexes abnormal.     Reflex Scores:      Tricep reflexes are 1+ on the right side and 1+ on the left side.      Bicep reflexes are 1+ on the right side and 1+ on the left side.      Brachioradialis reflexes are 1+ on the right side and 2+ on the left side.      Patellar reflexes are 1+ on the right side and 1+ on the left side.      Achilles reflexes are 1+ on the right side and 1+ on the left side.    Lab Results  Component Value Date   WBC 8.1 10/15/2018   HGB 15.5 10/15/2018   HCT 45.7 10/15/2018   PLT 233.0 10/15/2018   GLUCOSE 123 (H) 10/15/2018   CHOL 190 10/15/2018   TRIG 218.0 (H) 10/15/2018   HDL 60.10 10/15/2018   LDLDIRECT 113.0 10/15/2018   LDLCALC 112 (H) 03/13/2018   ALT 31 03/13/2018   AST 20 03/13/2018   NA 139 10/15/2018   K 3.7 10/15/2018   CL 100 10/15/2018   CREATININE 0.99 10/15/2018   BUN 12 10/15/2018   CO2 32 10/15/2018   TSH 0.50 03/13/2018   PSA 0.23 03/13/2018   HGBA1C 5.9 03/13/2018    Dg Cervical Spine Complete  Result Date: 10/15/2018 CLINICAL DATA:  63 year old male with posterior neck pain and stiffness for the past 5 months. No known injury. EXAM: CERVICAL SPINE - COMPLETE 4+ VIEW COMPARISON:  None. FINDINGS: No evidence of acute fracture or malalignment. Mild neuroforaminal narrowing on the right at C3-C4. Otherwise, no significant spondylolysis visualized. No evidence of lytic or blastic osseous lesion. The soft tissues are unremarkable. IMPRESSION: 1. Mild right-sided C3-C4 foraminal stenosis. Electronically  Signed   By: Jacqulynn Cadet M.D.   On: 10/15/2018 15:45    Assessment & Plan:   Steven Hickman was seen today for hypertension.  Diagnoses and all orders for this visit:  Essential hypertension- His blood pressure is not quite adequately well controlled.  Rather than add another medication he was asked to improve his lifestyle modifications. -  CBC with Differential/Platelet; Future -     Urinalysis, Routine w reflex microscopic; Future  Diuretic-induced hypokalemia- His potassium level is normal now. -     Basic metabolic panel; Future -     Magnesium; Future  Other abnormal glucose -     Basic metabolic panel; Future  Other microscopic hematuria- This has resolved.  Left cervical radiculopathy- His plain film shows foraminal stenosis and he has an abnormal neurologic exam.  I have asked him to undergo an MRI to see if there is a lesion (spinal stenosis, nerve impingement or tumor) that would be remedial to surgical intervention. -     DG Cervical Spine Complete; Future  Dyslipidemia, goal LDL below 100- He has not achieved his LDL goal.  He was asked him to be more compliant with the statin. -     Lipid panel; Future -     pravastatin (PRAVACHOL) 20 MG tablet; Take 1 tablet (20 mg total) by mouth daily.  Vitamin D deficiency disease- His vitamin D level is normal now. -     VITAMIN D 25 Hydroxy (Vit-D Deficiency, Fractures); Future   I am having Steven Hickman "Steven Hickman" maintain his fluticasone, levocetirizine, aspirin, ibuprofen, doxazosin, finasteride, Vitamin D3, EDARBYCLOR, potassium chloride, and pravastatin.  Meds ordered this encounter  Medications  . pravastatin (PRAVACHOL) 20 MG tablet    Sig: Take 1 tablet (20 mg total) by mouth daily.    Dispense:  90 tablet    Refill:  1     Follow-up: Return in about 4 months (around 02/13/2019).  Scarlette Calico, MD

## 2018-10-24 ENCOUNTER — Other Ambulatory Visit: Payer: Managed Care, Other (non HMO)

## 2018-11-18 ENCOUNTER — Other Ambulatory Visit: Payer: Self-pay | Admitting: Internal Medicine

## 2018-11-18 DIAGNOSIS — E785 Hyperlipidemia, unspecified: Secondary | ICD-10-CM

## 2018-11-19 ENCOUNTER — Other Ambulatory Visit: Payer: Self-pay | Admitting: Internal Medicine

## 2018-11-19 DIAGNOSIS — N401 Enlarged prostate with lower urinary tract symptoms: Principal | ICD-10-CM

## 2018-11-19 DIAGNOSIS — N138 Other obstructive and reflux uropathy: Secondary | ICD-10-CM

## 2018-12-01 ENCOUNTER — Ambulatory Visit (INDEPENDENT_AMBULATORY_CARE_PROVIDER_SITE_OTHER): Payer: Managed Care, Other (non HMO) | Admitting: Internal Medicine

## 2018-12-01 ENCOUNTER — Encounter: Payer: Self-pay | Admitting: Internal Medicine

## 2018-12-01 VITALS — BP 136/86 | HR 70 | Temp 98.3°F | Resp 16 | Ht 66.0 in | Wt 197.0 lb

## 2018-12-01 DIAGNOSIS — M4802 Spinal stenosis, cervical region: Secondary | ICD-10-CM

## 2018-12-01 DIAGNOSIS — I10 Essential (primary) hypertension: Secondary | ICD-10-CM | POA: Diagnosis not present

## 2018-12-01 NOTE — Progress Notes (Signed)
Subjective:  [name], male    DOB: 1956-09-26  Age: 63 y.o. MRN: [id number]  CC: Hypertension   HPI [name] presents for f/up - He continues to complain of left-sided neck pain that radiates towards his left trapezius and shoulder.  He is getting adequate symptom relief with Aleve.  He is having trouble with decreasing range of motion in the cervical spine when he rotates to the left side.  He denies paresthesias in his arms or legs.  He is not interested in surgical options.  Outpatient Medications Prior to Visit  Medication Sig Dispense Refill  . Cholecalciferol (VITAMIN D3) 1.25 MG (50000 UT) TABS TAKE 1 TABLET BY MOUTH ONE TIME PER WEEK 12 tablet 1  . CVS ASPIRIN ADULT LOW DOSE 81 MG chewable tablet TAKE 1 TABLET BY MOUTH EVERY DAY 90 tablet 1  . doxazosin (CARDURA) 2 MG tablet TAKE 1 TABLET (2 MG TOTAL) BY MOUTH DAILY. 90 tablet 1  . EDARBYCLOR 40-12.5 MG TABS TAKE ONE TABLET BY MOUTH DAILY 90 tablet 0  . finasteride (PROSCAR) 5 MG tablet Take 1 tablet (5 mg total) by mouth daily. 90 tablet 1  . fluticasone (FLONASE) 50 MCG/ACT nasal spray Place 1 spray into both nostrils daily.    Marland Kitchen ibuprofen (ADVIL,MOTRIN) 800 MG tablet Take 1 tablet (800 mg total) by mouth 3 (three) times daily. 30 tablet 0  . levocetirizine (XYZAL) 5 MG tablet Take 1 tablet (5 mg total) by mouth every evening. 90 tablet 3  . potassium chloride (K-DUR) 10 MEQ tablet TAKE 1 TABLET (10 MEQ TOTAL) BY MOUTH 3 (THREE) TIMES DAILY. 270 tablet 0  . pravastatin (PRAVACHOL) 20 MG tablet Take 1 tablet (20 mg total) by mouth daily. 90 tablet 1   No facility-administered medications prior to visit.     ROS Review of Systems  Constitutional: Negative.  Negative for diaphoresis and fatigue.  HENT: Negative.   Eyes: Negative for visual disturbance.  Respiratory: Negative for cough, chest tightness, shortness of breath and wheezing.   Cardiovascular: Negative for chest pain, palpitations and leg swelling.   Gastrointestinal: Negative for abdominal pain, constipation, diarrhea, nausea and vomiting.  Genitourinary: Negative.  Negative for difficulty urinating.  Musculoskeletal: Positive for neck pain. Negative for arthralgias and back pain.  Skin: Negative.  Negative for rash.  Neurological: Negative.  Negative for dizziness, weakness, numbness and headaches.  Hematological: Negative for adenopathy. Does not bruise/bleed easily.  Psychiatric/Behavioral: Negative.     Objective:  BP 136/86 (BP Location: Left Arm, Patient Position: Sitting, Cuff Size: Normal)   Pulse 70   Temp 98.3 F (36.8 C) (Oral)   Resp 16   Ht 5\' 6"  (1.676 m)   Wt 197 lb (89.4 kg)   SpO2 96%   BMI 31.80 kg/m   BP Readings from Last 3 Encounters:  12/01/18 136/86  10/15/18 (!) 142/76  06/03/18 115/71    Wt Readings from Last 3 Encounters:  12/01/18 197 lb (89.4 kg)  10/15/18 196 lb (88.9 kg)  06/03/18 193 lb (87.5 kg)    Physical Exam Vitals signs reviewed.  Constitutional:      Appearance: He is not ill-appearing or diaphoretic.  HENT:     Nose: Nose normal. No congestion.     Mouth/Throat:     Mouth: Mucous membranes are moist.     Pharynx: Oropharynx is clear. No oropharyngeal exudate.  Eyes:     Conjunctiva/sclera: Conjunctivae normal.  Neck:     Musculoskeletal: Normal range  of motion and neck supple. No neck rigidity or muscular tenderness.  Cardiovascular:     Rate and Rhythm: Normal rate and regular rhythm.     Heart sounds: No murmur.  Pulmonary:     Effort: Pulmonary effort is normal.     Breath sounds: Normal breath sounds. No stridor. No wheezing, rhonchi or rales.  Abdominal:     General: Bowel sounds are normal.     Palpations: There is no mass.     Tenderness: There is no abdominal tenderness. There is no guarding.  Musculoskeletal: Normal range of motion.        General: No swelling.     Right lower leg: No edema.     Left lower leg: No edema.  Skin:    General: Skin is warm  and dry.  Neurological:     General: No focal deficit present.     Mental Status: He is oriented to person, place, and time. Mental status is at baseline.     Sensory: No sensory deficit.     Motor: No weakness.     Coordination: Coordination normal.     Gait: Gait normal.     Deep Tendon Reflexes: Reflexes normal.     Lab Results  Component Value Date   WBC 8.1 10/15/2018   HGB 15.5 10/15/2018   HCT 45.7 10/15/2018   PLT 233.0 10/15/2018   GLUCOSE 123 (H) 10/15/2018   CHOL 190 10/15/2018   TRIG 218.0 (H) 10/15/2018   HDL 60.10 10/15/2018   LDLDIRECT 113.0 10/15/2018   LDLCALC 112 (H) 03/13/2018   ALT 31 03/13/2018   AST 20 03/13/2018   NA 139 10/15/2018   K 3.7 10/15/2018   CL 100 10/15/2018   CREATININE 0.99 10/15/2018   BUN 12 10/15/2018   CO2 32 10/15/2018   TSH 0.50 03/13/2018   PSA 0.23 03/13/2018   HGBA1C 5.9 03/13/2018    Dg Cervical Spine Complete  Result Date: 10/15/2018 CLINICAL DATA:  63 year old male with posterior neck pain and stiffness for the past 5 months. No known injury. EXAM: CERVICAL SPINE - COMPLETE 4+ VIEW COMPARISON:  None. FINDINGS: No evidence of acute fracture or malalignment. Mild neuroforaminal narrowing on the right at C3-C4. Otherwise, no significant spondylolysis visualized. No evidence of lytic or blastic osseous lesion. The soft tissues are unremarkable. IMPRESSION: 1. Mild right-sided C3-C4 foraminal stenosis. Electronically Signed   By: Jacqulynn Cadet M.D.   On: 10/15/2018 15:45    Assessment & Plan:   [name] was seen today for hypertension.  Diagnoses and all orders for this visit:  Spinal stenosis in cervical region- He may benefit from a course of epidural steroids.  I have therefore referred him to a pain specialist. -     Ambulatory referral to Pain Clinic  Essential hypertension- His blood pressure is adequately well controlled.   I am having Treven Holtman "[name]" maintain his fluticasone, levocetirizine, ibuprofen,  finasteride, Vitamin D3, EDARBYCLOR, potassium chloride, pravastatin, CVS ASPIRIN ADULT LOW DOSE, and doxazosin.  No orders of the defined types were placed in this encounter.    Follow-up: No follow-ups on file.  Scarlette Calico, MD

## 2018-12-01 NOTE — Patient Instructions (Signed)

## 2018-12-15 ENCOUNTER — Encounter: Payer: Self-pay | Admitting: Physical Medicine & Rehabilitation

## 2018-12-23 ENCOUNTER — Other Ambulatory Visit: Payer: Self-pay | Admitting: Internal Medicine

## 2018-12-23 DIAGNOSIS — N401 Enlarged prostate with lower urinary tract symptoms: Principal | ICD-10-CM

## 2018-12-23 DIAGNOSIS — N138 Other obstructive and reflux uropathy: Secondary | ICD-10-CM

## 2018-12-29 ENCOUNTER — Other Ambulatory Visit: Payer: Self-pay | Admitting: Internal Medicine

## 2018-12-29 DIAGNOSIS — I10 Essential (primary) hypertension: Secondary | ICD-10-CM

## 2018-12-29 MED ORDER — EDARBYCLOR 40-12.5 MG PO TABS: 1.0000 | ORAL_TABLET | Freq: Every day | ORAL | 0 refills | Status: DC

## 2019-01-12 ENCOUNTER — Ambulatory Visit: Payer: Managed Care, Other (non HMO) | Admitting: Physical Medicine & Rehabilitation

## 2019-01-14 ENCOUNTER — Other Ambulatory Visit: Payer: Self-pay | Admitting: Internal Medicine

## 2019-01-14 DIAGNOSIS — E559 Vitamin D deficiency, unspecified: Secondary | ICD-10-CM

## 2019-01-25 ENCOUNTER — Other Ambulatory Visit: Payer: Self-pay | Admitting: Internal Medicine

## 2019-01-25 DIAGNOSIS — E559 Vitamin D deficiency, unspecified: Secondary | ICD-10-CM

## 2019-01-26 ENCOUNTER — Other Ambulatory Visit: Payer: Self-pay | Admitting: Internal Medicine

## 2019-01-26 DIAGNOSIS — E559 Vitamin D deficiency, unspecified: Secondary | ICD-10-CM

## 2019-01-26 MED ORDER — CHOLECALCIFEROL 125 MCG (5000 UT) PO TABS: 1.0000 | ORAL_TABLET | ORAL | 0 refills | Status: DC

## 2019-01-27 ENCOUNTER — Other Ambulatory Visit: Payer: Self-pay | Admitting: Internal Medicine

## 2019-01-27 DIAGNOSIS — I10 Essential (primary) hypertension: Secondary | ICD-10-CM

## 2019-01-27 DIAGNOSIS — E876 Hypokalemia: Secondary | ICD-10-CM

## 2019-01-27 DIAGNOSIS — T502X5A Adverse effect of carbonic-anhydrase inhibitors, benzothiadiazides and other diuretics, initial encounter: Secondary | ICD-10-CM

## 2019-02-16 ENCOUNTER — Encounter: Payer: Managed Care, Other (non HMO) | Admitting: Physical Medicine & Rehabilitation

## 2019-02-20 ENCOUNTER — Other Ambulatory Visit: Payer: Self-pay | Admitting: Internal Medicine

## 2019-02-20 DIAGNOSIS — J301 Allergic rhinitis due to pollen: Secondary | ICD-10-CM

## 2019-03-02 ENCOUNTER — Other Ambulatory Visit: Payer: Self-pay

## 2019-03-02 ENCOUNTER — Encounter
Payer: Managed Care, Other (non HMO) | Attending: Physical Medicine & Rehabilitation | Admitting: Physical Medicine & Rehabilitation

## 2019-03-02 ENCOUNTER — Encounter: Payer: Self-pay | Admitting: Physical Medicine & Rehabilitation

## 2019-03-02 ENCOUNTER — Ambulatory Visit (HOSPITAL_COMMUNITY)
Admission: RE | Admit: 2019-03-02 | Discharge: 2019-03-02 | Disposition: A | Discharge status: Home / Self Care | Payer: Managed Care, Other (non HMO) | Source: Ambulatory Visit | Attending: Physical Medicine & Rehabilitation | Admitting: Physical Medicine & Rehabilitation

## 2019-03-02 VITALS — BP 154/85 | HR 95 | Temp 98.7°F | Ht 66.0 in | Wt 203.6 lb

## 2019-03-02 DIAGNOSIS — M791 Myalgia, unspecified site: Secondary | ICD-10-CM | POA: Diagnosis not present

## 2019-03-02 DIAGNOSIS — M4802 Spinal stenosis, cervical region: Secondary | ICD-10-CM | POA: Diagnosis not present

## 2019-03-02 DIAGNOSIS — M25512 Pain in left shoulder: Secondary | ICD-10-CM | POA: Insufficient documentation

## 2019-03-02 DIAGNOSIS — G479 Sleep disorder, unspecified: Secondary | ICD-10-CM

## 2019-03-02 MED ORDER — METHOCARBAMOL 500 MG PO TABS: 500.0000 mg | ORAL_TABLET | Freq: Two times a day (BID) | ORAL | 1 refills | Status: DC | PRN

## 2019-03-02 MED ORDER — DICLOFENAC SODIUM 1 % TD GEL: 2.0000 g | Freq: Four times a day (QID) | TRANSDERMAL | 1 refills | Status: DC

## 2019-03-02 NOTE — Progress Notes (Signed)
Subjective:    [name], male    DOB: [birth year], 63 y.o.   MRN: [id number]  HPI Male with pmh of HTN, GERD presents with left shoulder pain. Started 11/2018.  Denies inciting event.  Started in his neck and moved to his shoulder a week later.  Heat improves the pain.  Pain worse when first wakes up, gets better in a couple of minutes. Heavy activity again exacerbates it.  Dull.  Radiates down back at times.  Intermittent.  Aspacream helps.  Not sure if Advil helps.  Denies numbness.  Denies falls. Pain limits lifting and pushing up. Works lifting heavy bags.    Pain Inventory Average Pain 4 Pain Right Now 6 My pain is intermittent, sharp, dull and aching  In the last 24 hours, has pain interfered with the following? General activity 2 Relation with others 2 Enjoyment of life 1 What TIME of day is your pain at its worst? daytime Sleep (in general) Good  Pain is worse with: some activites Pain improves with: rest and heat/ice Relief from Meds: 4  Mobility walk without assistance how many minutes can you walk? 60 ability to climb steps?  yes do you drive?  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  no  Physicians involved in your care Any changes since last visit?  no   Family History  Problem Relation Age of Onset  . Parkinson's disease Sister   . Arthritis Other   . Diabetes Other   . Hyperlipidemia Other   . Hypertension Other   . Early death Neg Hx   . COPD Neg Hx   . Cancer Neg Hx   . Asthma Neg Hx   . Alcohol abuse Neg Hx   . Heart disease Neg Hx   . Stroke Neg Hx   . Kidney disease Neg Hx    Social History   Socioeconomic History  . Marital status: Married    Spouse name: Kerrie Buffalo  . Number of children: Y  . Years of education: Not on file  . Highest education level: Not on file  Occupational History  . Occupation:  Management consultant  Social Needs  . Financial resource strain: Not on file  . Food insecurity:    Worry: Not on file    Inability: Not on file  . Transportation needs:    Medical: Not on file    Non-medical: Not on file  Tobacco Use  . Smoking status: Former Smoker    Packs/day: 1.00    Years: 15.00    Pack years: 15.00    Types: Cigarettes  . Smokeless tobacco: Former Systems developer    Types: Chew    Quit date: 10/01/1978  . Tobacco comment: occ smokes 1 cigar per year.  Substance and Sexual Activity  . Alcohol use: Yes    Alcohol/week: 2.0 standard drinks    Types: 2 Cans of beer per week  . Drug use: No  . Sexual activity: Yes  Lifestyle  . Physical activity:    Days per week: Not on file    Minutes per session: Not on file  . Stress: Not on file  Relationships  . Social connections:    Talks on phone: Not on file    Gets together: Not on file    Attends religious service: Not on file  Active member of club or organization: Not on file    Attends meetings of clubs or organizations: Not on file    Relationship status: Not on file  Other Topics Concern  . Not on file  Social History Narrative  . Not on file   Past Surgical History:  Procedure Laterality Date  . KIDNEY STONE SURGERY    . TOOTH EXTRACTION     Past Medical History:  Diagnosis Date  . Allergy   . Arthritis   . GERD (gastroesophageal reflux disease)   . History of nephrolithiasis   . Hyperlipidemia   . Hypertension    BP (!) 154/85   Pulse 95   Temp 98.7 F (37.1 C)   Ht 5\' 6"  (1.676 m)   Wt 203 lb 9.6 oz (92.4 kg)   SpO2 95%   BMI 32.86 kg/m   Opioid Risk Score:   Fall Risk Score:  `1  Depression screen PHQ 2/9  Depression screen [facility] 2/9 03/02/2019 10/15/2018 03/13/2018 02/20/2016  Decreased Interest 1 0 0 0  Down, Depressed, Hopeless 0 0 0 0  PHQ - 2 Score 1 0 0 0  Altered sleeping 0 - - -  Tired, decreased energy 1 - - -  Change in appetite 0 - - -  Feeling bad or failure about yourself   0 - - -  Trouble concentrating 0 - - -  Moving slowly or fidgety/restless 0 - - -  Suicidal thoughts 0 - - -  PHQ-9 Score 2 - - -  Difficult doing work/chores Not difficult at all - - -    Review of Systems  Constitutional: Positive for unexpected weight change.  HENT: Negative.   Eyes: Negative.   Respiratory: Positive for shortness of breath.   Cardiovascular: Negative.   Gastrointestinal: Negative.   Endocrine: Negative.   Genitourinary: Negative.   Musculoskeletal: Positive for arthralgias, back pain and myalgias.  Skin: Negative.   Allergic/Immunologic: Negative.   Neurological: Negative.   Hematological: Negative.   Psychiatric/Behavioral: Negative.   All other systems reviewed and are negative.      Objective:   Physical Exam Gen: NAD. Vital signs reviewed HENT: Normocephalic, Atraumatic Eyes: EOMI. No discharge.  Cardio: No JVD. Pulm: Effort normal Abd: Soft MSK:  Gait WNL.   No TTP    No edema.   +left empty can test, drop arm test, mildly + hawkins  - Neers Neuro: CN II-XII grossly intact.    HOH  Sensation intact to light touch in all UE dermatomes  Reflexes 2+ throughout  Strength  5/5 in all UE myotomes  Neg hoffman's b/l  Neg Spurling's b/l  Neg compression relief test b/l Skin: Warm and Dry. Intact    Assessment & Plan:  Male with pmh of HTN, GERD presents with left shoulder pain.   1. Left shoulder pain  Neck xray 10/2018 reviewed, showing mild righ cervical foraminal stenosis C3-4 (symptoms on left side)  Left shoulder xray ordered  Labs reviewed  Referral information reviewed  Cont Heat  Trial Cold  Will consider PT with trial TENs  Will order Voltaren gel  Will consider Lidoderm patch  Will consider Gabapentin  Will consider Cymbalta  Will order Robaxin 500 BID PRN  Will consider Mobic  Patient states main goal is lift   2. Sleep disturbance  Will consider Elavil  States he has a good pillow and mattress  See #1  3. Myalgia    Will consider trigger point injections

## 2019-03-17 ENCOUNTER — Other Ambulatory Visit: Payer: Self-pay

## 2019-03-17 ENCOUNTER — Encounter: Payer: Self-pay | Admitting: Internal Medicine

## 2019-03-17 ENCOUNTER — Ambulatory Visit (INDEPENDENT_AMBULATORY_CARE_PROVIDER_SITE_OTHER): Payer: Managed Care, Other (non HMO) | Admitting: Internal Medicine

## 2019-03-17 ENCOUNTER — Other Ambulatory Visit (INDEPENDENT_AMBULATORY_CARE_PROVIDER_SITE_OTHER): Payer: Managed Care, Other (non HMO)

## 2019-03-17 VITALS — BP 136/74 | HR 80 | Temp 98.2°F | Resp 17 | Ht 66.0 in | Wt 209.8 lb

## 2019-03-17 DIAGNOSIS — R7309 Other abnormal glucose: Secondary | ICD-10-CM

## 2019-03-17 DIAGNOSIS — I10 Essential (primary) hypertension: Secondary | ICD-10-CM | POA: Diagnosis not present

## 2019-03-17 DIAGNOSIS — T502X5A Adverse effect of carbonic-anhydrase inhibitors, benzothiadiazides and other diuretics, initial encounter: Secondary | ICD-10-CM | POA: Diagnosis not present

## 2019-03-17 DIAGNOSIS — E876 Hypokalemia: Secondary | ICD-10-CM

## 2019-03-17 LAB — BASIC METABOLIC PANEL WITH GFR
BUN: 15 mg/dL (ref 6–23)
CO2: 29 meq/L (ref 19–32)
Calcium: 9.3 mg/dL (ref 8.4–10.5)
Chloride: 100 meq/L (ref 96–112)
Creatinine, Ser: 0.82 mg/dL (ref 0.40–1.50)
GFR: 95.44 mL/min
Glucose, Bld: 94 mg/dL (ref 70–99)
Potassium: 3.5 meq/L (ref 3.5–5.1)
Sodium: 137 meq/L (ref 135–145)

## 2019-03-17 LAB — HEMOGLOBIN A1C: Hgb A1c MFr Bld: 5.9 % (ref 4.6–6.5)

## 2019-03-17 NOTE — Progress Notes (Signed)
Subjective:  [name], male    DOB: Aug 24, 1956  Age: 63 y.o. MRN: [id number]  CC: Hypertension   HPI Steven Hickman presents for f/up - He complains of weight gain but otherwise feels well and offers no other complaints.  He tells me his blood pressure has been well controlled.  Outpatient Medications Prior to Visit  Medication Sig Dispense Refill  . Cholecalciferol (D-5000) 125 MCG (5000 UT) TABS Take 1 tablet (5,000 Units total) by mouth once a week. 12 tablet 0  . CVS ASPIRIN ADULT LOW DOSE 81 MG chewable tablet TAKE 1 TABLET BY MOUTH EVERY DAY 90 tablet 1  . diclofenac sodium (VOLTAREN) 1 % GEL Apply 2 g topically 4 (four) times daily. 1 Tube 1  . doxazosin (CARDURA) 2 MG tablet TAKE 1 TABLET (2 MG TOTAL) BY MOUTH DAILY. 90 tablet 1  . EDARBYCLOR 40-12.5 MG TABS Take 1 tablet by mouth daily. 90 tablet 0  . finasteride (PROSCAR) 5 MG tablet TAKE 1 TABLET BY MOUTH EVERY DAY 90 tablet 1  . fluticasone (FLONASE) 50 MCG/ACT nasal spray Place 1 spray into both nostrils daily.    Marland Kitchen levocetirizine (XYZAL) 5 MG tablet TAKE 1 TABLET (5 MG TOTAL) BY MOUTH EVERY EVENING. 90 tablet 3  . methocarbamol (ROBAXIN) 500 MG tablet Take 1 tablet (500 mg total) by mouth 2 (two) times daily as needed for muscle spasms. 60 tablet 1  . potassium chloride (K-DUR) 10 MEQ tablet TAKE 1 TABLET (10 MEQ TOTAL) BY MOUTH 3 (THREE) TIMES DAILY. 270 tablet 0  . pravastatin (PRAVACHOL) 20 MG tablet Take 1 tablet (20 mg total) by mouth daily. 90 tablet 1  . ibuprofen (ADVIL,MOTRIN) 800 MG tablet Take 1 tablet (800 mg total) by mouth 3 (three) times daily. 30 tablet 0   No facility-administered medications prior to visit.     ROS Review of Systems  Constitutional: Positive for unexpected weight change (wt gain). Negative for appetite change, diaphoresis and fatigue.  HENT: Negative.   Eyes: Negative for visual disturbance.  Respiratory: Positive for apnea. Negative for cough, chest tightness, shortness  of breath and wheezing.   Cardiovascular: Negative for chest pain, palpitations and leg swelling.  Gastrointestinal: Negative for abdominal pain, constipation, diarrhea, nausea and vomiting.  Endocrine: Negative.   Genitourinary: Negative.  Negative for difficulty urinating.  Musculoskeletal: Negative.  Negative for myalgias.  Skin: Negative.  Negative for color change and pallor.  Neurological: Negative.  Negative for dizziness, weakness and light-headedness.  Hematological: Negative for adenopathy. Does not bruise/bleed easily.  Psychiatric/Behavioral: Negative.     Objective:  BP 136/74   Pulse 80   Temp 98.2 F (36.8 C) (Oral)   Resp 17   Ht 5\' 6"  (1.676 m)   Wt 209 lb 12 oz (95.1 kg)   SpO2 98%   BMI 33.85 kg/m   BP Readings from Last 3 Encounters:  03/17/19 136/74  03/02/19 (!) 154/85  12/01/18 136/86    Wt Readings from Last 3 Encounters:  03/17/19 209 lb 12 oz (95.1 kg)  03/02/19 203 lb 9.6 oz (92.4 kg)  12/01/18 197 lb (89.4 kg)    Physical Exam Vitals signs reviewed.  Constitutional:      Appearance: He is obese. He is not ill-appearing or diaphoretic.  HENT:     Nose: Nose normal. No congestion.     Mouth/Throat:     Mouth: Mucous membranes are moist.     Pharynx: No oropharyngeal exudate or posterior oropharyngeal erythema.  Eyes:     General: No scleral icterus.    Conjunctiva/sclera: Conjunctivae normal.  Neck:     Musculoskeletal: Normal range of motion. No neck rigidity.  Cardiovascular:     Rate and Rhythm: Normal rate and regular rhythm.     Heart sounds: No murmur. No gallop.   Pulmonary:     Effort: Pulmonary effort is normal.     Breath sounds: No stridor. No wheezing, rhonchi or rales.  Abdominal:     General: Abdomen is protuberant. Bowel sounds are normal.     Palpations: There is no hepatomegaly or splenomegaly.     Tenderness: There is no abdominal tenderness.  Musculoskeletal: Normal range of motion.     Right lower leg: No  edema.     Left lower leg: No edema.  Lymphadenopathy:     Cervical: No cervical adenopathy.  Skin:    General: Skin is warm and dry.  Neurological:     General: No focal deficit present.     Mental Status: He is alert.  Psychiatric:        Mood and Affect: Mood normal.        Behavior: Behavior normal.     Lab Results  Component Value Date   WBC 8.1 10/15/2018   HGB 15.5 10/15/2018   HCT 45.7 10/15/2018   PLT 233.0 10/15/2018   GLUCOSE 94 03/17/2019   CHOL 190 10/15/2018   TRIG 218.0 (H) 10/15/2018   HDL 60.10 10/15/2018   LDLDIRECT 113.0 10/15/2018   LDLCALC 112 (H) 03/13/2018   ALT 31 03/13/2018   AST 20 03/13/2018   NA 137 03/17/2019   K 3.5 03/17/2019   CL 100 03/17/2019   CREATININE 0.82 03/17/2019   BUN 15 03/17/2019   CO2 29 03/17/2019   TSH 0.50 03/13/2018   PSA 0.23 03/13/2018   HGBA1C 5.9 03/17/2019    Dg Shoulder Left  Result Date: 03/02/2019 CLINICAL DATA:  Left shoulder pain. EXAM: LEFT SHOULDER - 2+ VIEW COMPARISON:  None. FINDINGS: There is no evidence of fracture or dislocation. There is no evidence of arthropathy or other focal bone abnormality. Soft tissues are unremarkable. IMPRESSION: Negative. Electronically Signed   By: Dorise Bullion III M.D   On: 03/02/2019 21:53    Assessment & Plan:   Steven Hickman was seen today for hypertension.  Diagnoses and all orders for this visit:  Essential hypertension- His blood pressure is adequately well controlled.  Electrolytes and renal function are normal. -     Basic metabolic panel; Future  Other abnormal glucose- His A1C is at 5.9%.  He has mild prediabetes.  Medical therapy is not indicated.  He will continue to improve his lifestyle modifications. -     Hemoglobin A1c; Future -     Basic metabolic panel; Future  Diuretic-induced hypokalemia- His potassium level is in the normal range.  Will continue the current potassium supplement. -     Basic metabolic panel; Future   I am having Steven Hickman  "Steven Hickman" maintain his fluticasone, ibuprofen, pravastatin, CVS Aspirin Adult Low Dose, doxazosin, finasteride, Edarbyclor, Cholecalciferol, potassium chloride, levocetirizine, diclofenac sodium, and methocarbamol.  No orders of the defined types were placed in this encounter.    Follow-up: No follow-ups on file.  Scarlette Calico, MD

## 2019-03-18 ENCOUNTER — Encounter: Payer: Self-pay | Admitting: Internal Medicine

## 2019-03-25 ENCOUNTER — Other Ambulatory Visit: Payer: Self-pay | Admitting: Internal Medicine

## 2019-03-25 DIAGNOSIS — I10 Essential (primary) hypertension: Secondary | ICD-10-CM

## 2019-03-30 ENCOUNTER — Encounter: Payer: Self-pay | Admitting: Physical Medicine & Rehabilitation

## 2019-03-30 ENCOUNTER — Other Ambulatory Visit: Payer: Self-pay

## 2019-03-30 ENCOUNTER — Encounter (HOSPITAL_BASED_OUTPATIENT_CLINIC_OR_DEPARTMENT_OTHER): Payer: Managed Care, Other (non HMO) | Admitting: Physical Medicine & Rehabilitation

## 2019-03-30 VITALS — BP 139/80 | HR 86 | Temp 97.9°F | Ht 66.0 in | Wt 210.0 lb

## 2019-03-30 DIAGNOSIS — M4802 Spinal stenosis, cervical region: Secondary | ICD-10-CM | POA: Diagnosis present

## 2019-03-30 DIAGNOSIS — M25512 Pain in left shoulder: Secondary | ICD-10-CM | POA: Diagnosis not present

## 2019-03-30 DIAGNOSIS — G479 Sleep disorder, unspecified: Secondary | ICD-10-CM | POA: Diagnosis not present

## 2019-03-30 DIAGNOSIS — M791 Myalgia, unspecified site: Secondary | ICD-10-CM

## 2019-03-30 NOTE — Progress Notes (Signed)
Subjective:    [name], male    DOB: 1956/01/14, 63 y.o.   MRN: [id number]  HPI Male with pmh of HTN, GERD presents with left shoulder pain.  Initially stated: Started 11/2018.  Denies inciting event.  Started in his neck and moved to his shoulder a week later.  Heat improves the pain.  Pain worse when first wakes up, gets better in a couple of minutes. Heavy activity again exacerbates it.  Dull.  Radiates down back at times.  Intermittent.  Aspacream helps.  Not sure if Advil helps.  Denies numbness.  Denies falls. Pain limits lifting and pushing up. Works lifting heavy bags.    Last clinic visit on 03/02/2019.  Since that time, he states he obtained xray of shoulder. He tried ice with benefit.  Good benefit with Voltaren gel. Robaxin helps, but causes drowsiness. Overall he notes significant improvement.   Pain Inventory Average Pain 2 Pain Right Now 1 My pain is intermittent, sharp, dull and aching  In the last 24 hours, has pain interfered with the following? General activity 0 Relation with others 0 Enjoyment of life 0 What TIME of day is your pain at its worst? morning Sleep (in general) Good  Pain is worse with: bending and some activites Pain improves with: rest, heat/ice and medication Relief from Meds: 4  Mobility walk without assistance how many minutes can you walk? 60 ability to climb steps?  yes do you drive?  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  no  Physicians involved in your care Any changes since last visit?  no   Family History  Problem Relation Age of Onset  . Parkinson's disease Sister   . Arthritis Other   . Diabetes Other   . Hyperlipidemia Other   . Hypertension Other   . Early death Neg Hx   . COPD Neg Hx   . Cancer Neg Hx   . Asthma Neg Hx   . Alcohol abuse Neg Hx   . Heart disease Neg Hx   . Stroke  Neg Hx   . Kidney disease Neg Hx    Social History   Socioeconomic History  . Marital status: Married    Spouse name: Kerrie Buffalo  . Number of children: Y  . Years of education: Not on file  . Highest education level: Not on file  Occupational History  . Occupation: Management consultant  Social Needs  . Financial resource strain: Not on file  . Food insecurity    Worry: Not on file    Inability: Not on file  . Transportation needs    Medical: Not on file    Non-medical: Not on file  Tobacco Use  . Smoking status: Former Smoker    Packs/day: 1.00    Years: 15.00    Pack years: 15.00    Types: Cigarettes  . Smokeless tobacco: Former Systems developer    Types: Chew    Quit date: 10/01/1978  . Tobacco comment: occ smokes 1 cigar per year.  Substance and Sexual Activity  . Alcohol use: Yes    Alcohol/week: 2.0 standard drinks    Types: 2 Cans of beer per week  . Drug use: No  . Sexual activity: Yes  Lifestyle  . Physical activity    Days per week: Not on file    Minutes per  session: Not on file  . Stress: Not on file  Relationships  . Social Herbalist on phone: Not on file    Gets together: Not on file    Attends religious service: Not on file    Active member of club or organization: Not on file    Attends meetings of clubs or organizations: Not on file    Relationship status: Not on file  Other Topics Concern  . Not on file  Social History Narrative  . Not on file   Past Surgical History:  Procedure Laterality Date  . KIDNEY STONE SURGERY    . TOOTH EXTRACTION     Past Medical History:  Diagnosis Date  . Allergy   . Arthritis   . GERD (gastroesophageal reflux disease)   . History of nephrolithiasis   . Hyperlipidemia   . Hypertension    BP 139/80   Pulse 86   Temp 97.9 F (36.6 C)   Ht 5\' 6"  (1.676 m)   Wt 210 lb (95.3 kg)   SpO2 93%   BMI 33.89 kg/m   Opioid Risk Score:   Fall Risk Score:  `1  Depression screen PHQ 2/9  Depression screen [facility]  2/9 03/30/2019 03/02/2019 10/15/2018 03/13/2018 02/20/2016  Decreased Interest 1 1 0 0 0  Down, Depressed, Hopeless 0 0 0 0 0  PHQ - 2 Score 1 1 0 0 0  Altered sleeping - 0 - - -  Tired, decreased energy - 1 - - -  Change in appetite - 0 - - -  Feeling bad or failure about yourself  - 0 - - -  Trouble concentrating - 0 - - -  Moving slowly or fidgety/restless - 0 - - -  Suicidal thoughts - 0 - - -  PHQ-9 Score - 2 - - -  Difficult doing work/chores - Not difficult at all - - -    Review of Systems  Constitutional: Positive for unexpected weight change.  HENT: Negative.   Eyes: Negative.   Respiratory: Positive for shortness of breath.   Cardiovascular: Negative.   Gastrointestinal: Negative.   Endocrine: Negative.   Genitourinary: Negative.   Musculoskeletal: Positive for arthralgias, back pain and myalgias.  Skin: Negative.   Allergic/Immunologic: Negative.   Neurological: Negative.   Hematological: Negative.   Psychiatric/Behavioral: Negative.   All other systems reviewed and are negative.      Objective:   Physical Exam Gen: NAD. Vital signs reviewed HENT: Normocephalic, Atraumatic Eyes: EOMI. No discharge.  Cardio: No JVD. Pulm: Effort normal Abd: Soft MSK:  Gait WNL.   No TTP    No edema.   Neg left empty can test, drop arm test,  hawkins  Neers Neuro: CN II-XII grossly intact.    HOH  Strength  5/5 in all UE myotomes Skin: Warm and Dry. Intact    Assessment & Plan:  Male with pmh of HTN, GERD presents with left shoulder pain.   1. Left shoulder pain  Neck xray 10/2018 reviewed, showing mild righ cervical foraminal stenosis C3-4 (symptoms on left side)  Left shoulder xray personally reviewed, negative  Recent labs reviewed from PCP.  Exchanged communication with PCP.  Cont Cold  Cont Voltaren gel, using ~2/day  Decrease Robaxin 250 as it causes drowsiness  Will consider PT with trial TENs  Will consider Lidoderm patch  Will consider Gabapentin  Will  consider Cymbalta  Will consider Mobic  Patient states main goal is lift, which he  is able to do more  Will consider steroid injection for RTC in future if necessary   2. Sleep disturbance  Will consider Elavil  States he has a good pillow and mattress  See #1  Improving  3. Myalgia   Will consider trigger point injections

## 2019-04-12 ENCOUNTER — Other Ambulatory Visit: Payer: Self-pay | Admitting: Internal Medicine

## 2019-04-12 DIAGNOSIS — E785 Hyperlipidemia, unspecified: Secondary | ICD-10-CM

## 2019-04-22 ENCOUNTER — Other Ambulatory Visit: Payer: Self-pay | Admitting: Internal Medicine

## 2019-04-22 DIAGNOSIS — I10 Essential (primary) hypertension: Secondary | ICD-10-CM

## 2019-04-22 DIAGNOSIS — E876 Hypokalemia: Secondary | ICD-10-CM

## 2019-04-22 DIAGNOSIS — T502X5A Adverse effect of carbonic-anhydrase inhibitors, benzothiadiazides and other diuretics, initial encounter: Secondary | ICD-10-CM

## 2019-04-27 ENCOUNTER — Other Ambulatory Visit: Payer: Self-pay | Admitting: Physical Medicine & Rehabilitation

## 2019-05-04 ENCOUNTER — Other Ambulatory Visit: Payer: Self-pay | Admitting: Internal Medicine

## 2019-05-04 DIAGNOSIS — E559 Vitamin D deficiency, unspecified: Secondary | ICD-10-CM

## 2019-05-12 ENCOUNTER — Other Ambulatory Visit: Payer: Self-pay | Admitting: Internal Medicine

## 2019-05-12 DIAGNOSIS — E785 Hyperlipidemia, unspecified: Secondary | ICD-10-CM

## 2019-05-12 DIAGNOSIS — N401 Enlarged prostate with lower urinary tract symptoms: Secondary | ICD-10-CM

## 2019-05-12 DIAGNOSIS — N138 Other obstructive and reflux uropathy: Secondary | ICD-10-CM

## 2019-08-20 ENCOUNTER — Other Ambulatory Visit: Payer: Self-pay | Admitting: Internal Medicine

## 2019-08-20 DIAGNOSIS — T502X5A Adverse effect of carbonic-anhydrase inhibitors, benzothiadiazides and other diuretics, initial encounter: Secondary | ICD-10-CM

## 2019-08-20 DIAGNOSIS — I10 Essential (primary) hypertension: Secondary | ICD-10-CM

## 2019-08-20 DIAGNOSIS — E876 Hypokalemia: Secondary | ICD-10-CM

## 2019-08-21 MED ORDER — POTASSIUM CHLORIDE ER 10 MEQ PO TBCR: 10.0000 meq | EXTENDED_RELEASE_TABLET | Freq: Three times a day (TID) | ORAL | 0 refills | Status: DC

## 2019-09-18 ENCOUNTER — Other Ambulatory Visit: Payer: Self-pay | Admitting: Internal Medicine

## 2019-09-18 DIAGNOSIS — I10 Essential (primary) hypertension: Secondary | ICD-10-CM

## 2019-10-19 ENCOUNTER — Other Ambulatory Visit: Payer: Self-pay | Admitting: Internal Medicine

## 2019-10-19 DIAGNOSIS — I10 Essential (primary) hypertension: Secondary | ICD-10-CM

## 2019-10-19 DIAGNOSIS — E876 Hypokalemia: Secondary | ICD-10-CM

## 2019-10-19 DIAGNOSIS — T502X5A Adverse effect of carbonic-anhydrase inhibitors, benzothiadiazides and other diuretics, initial encounter: Secondary | ICD-10-CM

## 2019-10-19 DIAGNOSIS — E559 Vitamin D deficiency, unspecified: Secondary | ICD-10-CM

## 2019-11-01 MED ORDER — POTASSIUM CHLORIDE ER 10 MEQ PO TBCR: 10.0000 meq | EXTENDED_RELEASE_TABLET | Freq: Three times a day (TID) | ORAL | 0 refills | Status: DC

## 2019-11-01 MED ORDER — D-5000 125 MCG (5000 UT) PO TABS: 1.0000 | ORAL_TABLET | ORAL | 0 refills | Status: DC

## 2019-11-09 ENCOUNTER — Other Ambulatory Visit: Payer: Self-pay

## 2019-11-09 ENCOUNTER — Encounter: Payer: Self-pay | Admitting: Internal Medicine

## 2019-11-09 ENCOUNTER — Ambulatory Visit (INDEPENDENT_AMBULATORY_CARE_PROVIDER_SITE_OTHER): Payer: Managed Care, Other (non HMO) | Admitting: Internal Medicine

## 2019-11-09 VITALS — BP 136/82 | HR 82 | Temp 98.1°F | Resp 16 | Ht 66.0 in | Wt 211.5 lb

## 2019-11-09 DIAGNOSIS — T502X5A Adverse effect of carbonic-anhydrase inhibitors, benzothiadiazides and other diuretics, initial encounter: Secondary | ICD-10-CM

## 2019-11-09 DIAGNOSIS — E876 Hypokalemia: Secondary | ICD-10-CM | POA: Diagnosis not present

## 2019-11-09 DIAGNOSIS — E559 Vitamin D deficiency, unspecified: Secondary | ICD-10-CM | POA: Diagnosis not present

## 2019-11-09 DIAGNOSIS — R0609 Other forms of dyspnea: Secondary | ICD-10-CM

## 2019-11-09 DIAGNOSIS — R0600 Dyspnea, unspecified: Secondary | ICD-10-CM | POA: Diagnosis not present

## 2019-11-09 DIAGNOSIS — Z0000 Encounter for general adult medical examination without abnormal findings: Secondary | ICD-10-CM | POA: Diagnosis not present

## 2019-11-09 DIAGNOSIS — I10 Essential (primary) hypertension: Secondary | ICD-10-CM

## 2019-11-09 DIAGNOSIS — G4733 Obstructive sleep apnea (adult) (pediatric): Secondary | ICD-10-CM

## 2019-11-09 DIAGNOSIS — R7303 Prediabetes: Secondary | ICD-10-CM

## 2019-11-09 DIAGNOSIS — N401 Enlarged prostate with lower urinary tract symptoms: Secondary | ICD-10-CM

## 2019-11-09 DIAGNOSIS — Z125 Encounter for screening for malignant neoplasm of prostate: Secondary | ICD-10-CM | POA: Diagnosis not present

## 2019-11-09 DIAGNOSIS — E785 Hyperlipidemia, unspecified: Secondary | ICD-10-CM

## 2019-11-09 DIAGNOSIS — N138 Other obstructive and reflux uropathy: Secondary | ICD-10-CM

## 2019-11-09 LAB — CBC WITH DIFFERENTIAL/PLATELET
Basophils Absolute: 0.1 10*3/uL (ref 0.0–0.1)
Basophils Relative: 1 % (ref 0.0–3.0)
Eosinophils Absolute: 0.1 10*3/uL (ref 0.0–0.7)
Eosinophils Relative: 0.9 % (ref 0.0–5.0)
HCT: 43.3 % (ref 39.0–52.0)
Hemoglobin: 14.7 g/dL (ref 13.0–17.0)
Lymphocytes Relative: 37 % (ref 12.0–46.0)
Lymphs Abs: 2.3 10*3/uL (ref 0.7–4.0)
MCHC: 34 g/dL (ref 30.0–36.0)
MCV: 92.2 fl (ref 78.0–100.0)
Monocytes Absolute: 0.5 10*3/uL (ref 0.1–1.0)
Monocytes Relative: 8.6 % (ref 3.0–12.0)
Neutro Abs: 3.3 10*3/uL (ref 1.4–7.7)
Neutrophils Relative %: 52.5 % (ref 43.0–77.0)
Platelets: 208 10*3/uL (ref 150.0–400.0)
RBC: 4.7 Mil/uL (ref 4.22–5.81)
RDW: 13.3 % (ref 11.5–15.5)
WBC: 6.2 10*3/uL (ref 4.0–10.5)

## 2019-11-09 LAB — PSA: PSA: 0.1 ng/mL (ref 0.10–4.00)

## 2019-11-09 LAB — LIPID PANEL
Cholesterol: 181 mg/dL (ref 0–200)
HDL: 53.9 mg/dL (ref >39.00)
LDL Cholesterol: 101 mg/dL — ABNORMAL HIGH (ref 0–99)
NonHDL: 126.72
Total CHOL/HDL Ratio: 3
Triglycerides: 130 mg/dL (ref 0.0–149.0)
VLDL: 26 mg/dL (ref 0.0–40.0)

## 2019-11-09 LAB — HEPATIC FUNCTION PANEL
ALT: 39 U/L (ref 0–53)
AST: 25 U/L (ref 0–37)
Albumin: 4.1 g/dL (ref 3.5–5.2)
Alkaline Phosphatase: 72 U/L (ref 39–117)
Bilirubin, Direct: 0 mg/dL (ref 0.0–0.3)
Total Bilirubin: 0.4 mg/dL (ref 0.2–1.2)
Total Protein: 7.2 g/dL (ref 6.0–8.3)

## 2019-11-09 LAB — BASIC METABOLIC PANEL
BUN: 15 mg/dL (ref 6–23)
CO2: 28 mEq/L (ref 19–32)
Calcium: 9.4 mg/dL (ref 8.4–10.5)
Chloride: 102 mEq/L (ref 96–112)
Creatinine, Ser: 0.98 mg/dL (ref 0.40–1.50)
GFR: 77.53 mL/min (ref >60.00)
Glucose, Bld: 111 mg/dL — ABNORMAL HIGH (ref 70–99)
Potassium: 3.7 mEq/L (ref 3.5–5.1)
Sodium: 138 mEq/L (ref 135–145)

## 2019-11-09 LAB — URINALYSIS, ROUTINE W REFLEX MICROSCOPIC
Bilirubin Urine: NEGATIVE
Hgb urine dipstick: NEGATIVE
Ketones, ur: NEGATIVE
Leukocytes,Ua: NEGATIVE
Nitrite: NEGATIVE
RBC / HPF: NONE SEEN (ref 0–?)
Specific Gravity, Urine: 1.02 (ref 1.000–1.030)
Total Protein, Urine: NEGATIVE
Urine Glucose: NEGATIVE
Urobilinogen, UA: 0.2 (ref 0.0–1.0)
WBC, UA: NONE SEEN (ref 0–?)
pH: 7 (ref 5.0–8.0)

## 2019-11-09 LAB — BRAIN NATRIURETIC PEPTIDE: Pro B Natriuretic peptide (BNP): 12 pg/mL (ref 0.0–100.0)

## 2019-11-09 LAB — TSH: TSH: 0.62 u[IU]/mL (ref 0.35–4.50)

## 2019-11-09 LAB — HEMOGLOBIN A1C: Hgb A1c MFr Bld: 5.9 % (ref 4.6–6.5)

## 2019-11-09 LAB — TROPONIN I (HIGH SENSITIVITY): High Sens Troponin I: 4 ng/L (ref 2–17)

## 2019-11-09 LAB — VITAMIN D 25 HYDROXY (VIT D DEFICIENCY, FRACTURES): VITD: 19.62 ng/mL — ABNORMAL LOW (ref 30.00–100.00)

## 2019-11-09 MED ORDER — D-5000 125 MCG (5000 UT) PO TABS: 1.0000 | ORAL_TABLET | ORAL | 0 refills | Status: DC

## 2019-11-09 NOTE — Patient Instructions (Signed)

## 2019-11-09 NOTE — Progress Notes (Signed)
Subjective:  [name], male    DOB: 1956-01-18  Age: 64 y.o. MRN: [id number]  CC: Hypertension, Hyperlipidemia, and Annual Exam  This visit occurred during the SARS-CoV-2 public health emergency.  Safety protocols were in place, including screening questions prior to the visit, additional usage of staff PPE, and extensive cleaning of exam room while observing appropriate contact time as indicated for disinfecting solutions.   HPI Steven Hickman presents for a CPX.  He complains of chronic DOE after climbing 2 flights of stairs. He never has CP, diaphoresis, edema, fatigue, dizziness, or lightheadedness. The DOE has not worsened recently. He tells me that his BP has been well controlled.  Outpatient Medications Prior to Visit  Medication Sig Dispense Refill  . EDARBYCLOR 40-12.5 MG TABS TAKE ONE TABLET BY MOUTH DAILY  90 tablet 1  . levocetirizine (XYZAL) 5 MG tablet TAKE 1 TABLET (5 MG TOTAL) BY MOUTH EVERY EVENING. 90 tablet 3  . potassium chloride (KLOR-CON) 10 MEQ tablet Take 1 tablet (10 mEq total) by mouth 3 (three) times daily. 270 tablet 0  . pravastatin (PRAVACHOL) 20 MG tablet TAKE 1 TABLET BY MOUTH EVERY DAY 90 tablet 1  . aspirin 81 MG chewable tablet TAKE 1 TABLET BY MOUTH EVERY DAY 90 tablet 1  . Cholecalciferol (D-5000) 125 MCG (5000 UT) TABS Take 1 tablet (5,000 Units total) by mouth once a week. 12 tablet 0  . doxazosin (CARDURA) 2 MG tablet TAKE 1 TABLET BY MOUTH EVERY DAY 90 tablet 1  . diclofenac sodium (VOLTAREN) 1 % GEL Apply 2 g topically 4 (four) times daily. 1 Tube 1  . fluticasone (FLONASE) 50 MCG/ACT nasal spray Place 1 spray into both nostrils daily.    Marland Kitchen ibuprofen (ADVIL,MOTRIN) 800 MG tablet Take 1 tablet (800 mg total) by mouth 3 (three) times daily. 30 tablet 0  . methocarbamol (ROBAXIN) 500 MG tablet Take a half tablet(total 250mg ) four times a day. 30 tablet 1   No facility-administered medications prior to visit.    ROS Review of Systems    Constitutional: Positive for unexpected weight change (wt gain). Negative for diaphoresis and fatigue.  HENT: Negative.   Eyes: Negative.   Respiratory: Positive for apnea and shortness of breath. Negative for cough, chest tightness and wheezing.        ++ snoring  Cardiovascular: Negative for chest pain, palpitations and leg swelling.  Gastrointestinal: Negative for abdominal pain, blood in stool, constipation, diarrhea, nausea and vomiting.  Endocrine: Negative.   Genitourinary: Negative.  Negative for difficulty urinating, penile swelling, scrotal swelling, testicular pain and urgency.       + weak urine stream  Musculoskeletal: Negative.  Negative for arthralgias and myalgias.  Skin: Negative.  Negative for pallor.  Neurological: Negative.  Negative for dizziness, weakness, light-headedness and headaches.  Hematological: Negative.   Psychiatric/Behavioral: Negative.     Objective:  BP 136/82 (BP Location: Left Arm, Patient Position: Sitting, Cuff Size: Normal)   Pulse 82   Temp 98.1 F (36.7 C) (Oral)   Resp 16   Ht 5\' 6"  (1.676 m)   Wt 211 lb 8 oz (95.9 kg)   SpO2 96%   BMI 34.14 kg/m   BP Readings from Last 3 Encounters:  11/09/19 136/82  03/30/19 139/80  03/17/19 136/74    Wt Readings from Last 3 Encounters:  11/09/19 211 lb 8 oz (95.9 kg)  03/30/19 210 lb (95.3 kg)  03/17/19 209 lb 12 oz (95.1 kg)  Physical Exam Vitals reviewed.  Constitutional:      Appearance: He is obese.  HENT:     Nose: Nose normal.     Mouth/Throat:     Mouth: Mucous membranes are moist.  Eyes:     General: No scleral icterus.    Conjunctiva/sclera: Conjunctivae normal.  Cardiovascular:     Rate and Rhythm: Normal rate and regular rhythm.     Heart sounds: No murmur. No gallop.      Comments:  EKG -- NSR, No LVH Normal EKG Pulmonary:     Effort: Pulmonary effort is normal.     Breath sounds: No stridor. No wheezing, rhonchi or rales.  Abdominal:     General: Abdomen is  protuberant. Bowel sounds are normal. There is no distension.     Palpations: Abdomen is soft. There is no hepatomegaly or splenomegaly.     Tenderness: There is no abdominal tenderness.     Hernia: No hernia is present.  Genitourinary:    Pubic Area: No rash.      Penis: Normal. No discharge, swelling or lesions.      Testes: Normal.        Right: Mass or tenderness not present.        Left: Mass or tenderness not present.     Epididymis:     Right: Normal. Not inflamed or enlarged.     Left: Not inflamed or enlarged.  Musculoskeletal:     Cervical back: Neck supple.  Lymphadenopathy:     Cervical: No cervical adenopathy.  Neurological:     Mental Status: He is alert.     Lab Results  Component Value Date   WBC 6.2 11/09/2019   HGB 14.7 11/09/2019   HCT 43.3 11/09/2019   PLT 208.0 11/09/2019   GLUCOSE 111 (H) 11/09/2019   CHOL 181 11/09/2019   TRIG 130.0 11/09/2019   HDL 53.90 11/09/2019   LDLDIRECT 113.0 10/15/2018   LDLCALC 101 (H) 11/09/2019   ALT 39 11/09/2019   AST 25 11/09/2019   NA 138 11/09/2019   K 3.7 11/09/2019   CL 102 11/09/2019   CREATININE 0.98 11/09/2019   BUN 15 11/09/2019   CO2 28 11/09/2019   TSH 0.62 11/09/2019   PSA 0.10 11/09/2019   HGBA1C 5.9 11/09/2019    DG Shoulder Left  Result Date: 03/02/2019 CLINICAL DATA:  Left shoulder pain. EXAM: LEFT SHOULDER - 2+ VIEW COMPARISON:  None. FINDINGS: There is no evidence of fracture or dislocation. There is no evidence of arthropathy or other focal bone abnormality. Soft tissues are unremarkable. IMPRESSION: Negative. Electronically Signed   By: Dorise Bullion III M.D   On: 03/02/2019 21:53    Assessment & Plan:   Procopio was seen today for hypertension, hyperlipidemia and annual exam.  Diagnoses and all orders for this visit:  Essential hypertension- His BP is well controlled. -     CBC with Differential/Platelet -     Basic metabolic panel -     Urinalysis, Routine w reflex microscopic -      TSH -     EKG 12-Lead  Vitamin D deficiency disease- His Vit D level remains low. I have asked him to be compliant the Vit D supplement. -     VITAMIN D 25 Hydroxy (Vit-D Deficiency, Fractures) -     Discontinue: Cholecalciferol (D-5000) 125 MCG (5000 UT) TABS; Take 1 tablet (5,000 Units total) by mouth once a week.  Diuretic-induced hypokalemia- His K+ level is normal, will  cont the dose of the K+ supplement. -     Basic metabolic panel  Routine general medical examination at a health care facility- Exam completed, labs reviewed, vaccines are UTD, colon cancer screening is UTD, pt ed was given. -     Lipid panel -     PSA  Dyslipidemia, goal LDL below 100-  He has achieved his LDL goal and is doing well on the statin. -     TSH -     Hepatic function panel -     aspirin 81 MG chewable tablet; Chew 1 tablet (81 mg total) by mouth daily.  Prediabetes- Medical therapy is not indicated. -     Hemoglobin A1c  OSA (obstructive sleep apnea) -     Ambulatory referral to Sleep Studies  DOE (dyspnea on exertion)- His EKG is normal and he denies angina. His BNP and troponin are normal. I don't think this is caused by CV dz but is related to obesity and deconditioning. He was encouraged to improve his lifestyle modifications. -     Brain natriuretic peptide -     Troponin I (High Sensitivity) -     EKG 12-Lead  BPH with obstruction/lower urinary tract symptoms- his sx's are well controlled. -     doxazosin (CARDURA) 2 MG tablet; Take 1 tablet (2 mg total) by mouth daily.   I have discontinued Lang Lopezrodriguez "Carl"'s fluticasone, ibuprofen, diclofenac sodium, methocarbamol, and D-5000. I have also changed his aspirin and doxazosin. Additionally, I am having him maintain his levocetirizine, Edarbyclor, pravastatin, and potassium chloride.  Meds ordered this encounter  Medications  . DISCONTD: Cholecalciferol (D-5000) 125 MCG (5000 UT) TABS    Sig: Take 1 tablet (5,000 Units total) by mouth  once a week.    Dispense:  12 tablet    Refill:  0  . aspirin 81 MG chewable tablet    Sig: Chew 1 tablet (81 mg total) by mouth daily.    Dispense:  90 tablet    Refill:  1  . doxazosin (CARDURA) 2 MG tablet    Sig: Take 1 tablet (2 mg total) by mouth daily.    Dispense:  90 tablet    Refill:  1     Follow-up: Return in about 6 months (around 05/08/2020).  Scarlette Calico, MD

## 2019-11-10 ENCOUNTER — Other Ambulatory Visit: Payer: Self-pay | Admitting: Internal Medicine

## 2019-11-10 ENCOUNTER — Encounter: Payer: Self-pay | Admitting: Internal Medicine

## 2019-11-10 DIAGNOSIS — E559 Vitamin D deficiency, unspecified: Secondary | ICD-10-CM

## 2019-11-10 MED ORDER — D-5000 125 MCG (5000 UT) PO TABS: 1.0000 | ORAL_TABLET | ORAL | 0 refills | Status: DC

## 2019-11-10 MED ORDER — CHOLECALCIFEROL 1.25 MG (50000 UT) PO CAPS: 50000.0000 [IU] | ORAL_CAPSULE | ORAL | 1 refills | Status: DC

## 2019-11-11 MED ORDER — PRAVASTATIN SODIUM 20 MG PO TABS: 20.0000 mg | ORAL_TABLET | Freq: Every day | ORAL | 1 refills | Status: DC

## 2019-11-11 MED ORDER — DOXAZOSIN MESYLATE 2 MG PO TABS: 2.0000 mg | ORAL_TABLET | Freq: Every day | ORAL | 1 refills | Status: DC

## 2019-11-11 MED ORDER — ASPIRIN 81 MG PO CHEW: 81.0000 mg | CHEWABLE_TABLET | Freq: Every day | ORAL | 1 refills | Status: DC

## 2019-11-24 ENCOUNTER — Encounter: Payer: Self-pay | Admitting: Internal Medicine

## 2019-12-14 ENCOUNTER — Other Ambulatory Visit: Payer: Self-pay | Admitting: Internal Medicine

## 2019-12-14 DIAGNOSIS — I10 Essential (primary) hypertension: Secondary | ICD-10-CM

## 2020-01-27 ENCOUNTER — Other Ambulatory Visit: Payer: Self-pay | Admitting: Internal Medicine

## 2020-01-27 DIAGNOSIS — I10 Essential (primary) hypertension: Secondary | ICD-10-CM

## 2020-01-27 DIAGNOSIS — E876 Hypokalemia: Secondary | ICD-10-CM

## 2020-01-27 DIAGNOSIS — T502X5A Adverse effect of carbonic-anhydrase inhibitors, benzothiadiazides and other diuretics, initial encounter: Secondary | ICD-10-CM

## 2020-01-27 MED ORDER — POTASSIUM CHLORIDE ER 10 MEQ PO TBCR: 10.0000 meq | EXTENDED_RELEASE_TABLET | Freq: Three times a day (TID) | ORAL | 0 refills | Status: DC

## 2020-04-20 ENCOUNTER — Other Ambulatory Visit: Payer: Self-pay | Admitting: Internal Medicine

## 2020-04-20 DIAGNOSIS — I10 Essential (primary) hypertension: Secondary | ICD-10-CM

## 2020-04-20 DIAGNOSIS — T502X5A Adverse effect of carbonic-anhydrase inhibitors, benzothiadiazides and other diuretics, initial encounter: Secondary | ICD-10-CM

## 2020-04-27 ENCOUNTER — Other Ambulatory Visit: Payer: Self-pay | Admitting: Internal Medicine

## 2020-04-27 DIAGNOSIS — E559 Vitamin D deficiency, unspecified: Secondary | ICD-10-CM

## 2020-05-09 ENCOUNTER — Encounter: Payer: Self-pay | Admitting: Internal Medicine

## 2020-05-09 ENCOUNTER — Ambulatory Visit (INDEPENDENT_AMBULATORY_CARE_PROVIDER_SITE_OTHER): Payer: Managed Care, Other (non HMO) | Admitting: Internal Medicine

## 2020-05-09 ENCOUNTER — Other Ambulatory Visit: Payer: Self-pay

## 2020-05-09 VITALS — BP 144/78 | HR 90 | Temp 98.5°F | Resp 16 | Ht 66.0 in | Wt 214.0 lb

## 2020-05-09 DIAGNOSIS — E559 Vitamin D deficiency, unspecified: Secondary | ICD-10-CM

## 2020-05-09 DIAGNOSIS — I10 Essential (primary) hypertension: Secondary | ICD-10-CM

## 2020-05-09 DIAGNOSIS — Z23 Encounter for immunization: Secondary | ICD-10-CM | POA: Diagnosis not present

## 2020-05-09 LAB — BASIC METABOLIC PANEL WITH GFR
BUN: 16 mg/dL (ref 7–25)
CO2: 27 mmol/L (ref 20–32)
Calcium: 9.6 mg/dL (ref 8.6–10.3)
Chloride: 100 mmol/L (ref 98–110)
Creat: 1.02 mg/dL (ref 0.70–1.25)
GFR, Est African American: 91 mL/min/{1.73_m2} (ref >=60)
GFR, Est Non African American: 78 mL/min/{1.73_m2} (ref >=60)
Glucose, Bld: 110 mg/dL — ABNORMAL HIGH (ref 65–99)
Potassium: 3.9 mmol/L (ref 3.5–5.3)
Sodium: 138 mmol/L (ref 135–146)

## 2020-05-09 LAB — VITAMIN D 25 HYDROXY (VIT D DEFICIENCY, FRACTURES): Vit D, 25-Hydroxy: 42 ng/mL (ref 30–100)

## 2020-05-09 MED ORDER — ZOSTER VAC RECOMB ADJUVANTED 50 MCG/0.5ML IM SUSR: 0.5000 mL | Freq: Once | INTRAMUSCULAR | 1 refills | Status: DC

## 2020-05-09 NOTE — Progress Notes (Signed)
Subjective:  [name], male    DOB: Feb 21, 1956  Age: 64 y.o. MRN: [id number]  CC: Hypertension  This visit occurred during the SARS-CoV-2 public health emergency.  Safety protocols were in place, including screening questions prior to the visit, additional usage of staff PPE, and extensive cleaning of exam room while observing appropriate contact time as indicated for disinfecting solutions.    HPI [name] presents for f/up - He complains of weight gain but otherwise feels well and offers no complaints.  He works out on a treadmill and does not experience CP, DOE, palpitations, edema, or fatigue.  Outpatient Medications Prior to Visit  Medication Sig Dispense Refill  . aspirin 81 MG chewable tablet Chew 1 tablet (81 mg total) by mouth daily. 90 tablet 1  . Cholecalciferol (VITAMIN D3) 1.25 MG (50000 UT) CAPS TAKE 1 CAPSULE BY MOUTH ONE TIME PER WEEK 12 capsule 0  . doxazosin (CARDURA) 2 MG tablet Take 1 tablet (2 mg total) by mouth daily. 90 tablet 1  . EDARBYCLOR 40-12.5 MG TABS TAKE ONE TABLET BY MOUTH DAILY  90 tablet 1  . levocetirizine (XYZAL) 5 MG tablet TAKE 1 TABLET (5 MG TOTAL) BY MOUTH EVERY EVENING. 90 tablet 3  . potassium chloride (KLOR-CON) 10 MEQ tablet TAKE 1 TABLET (10 MEQ TOTAL) BY MOUTH 3 (THREE) TIMES DAILY. 270 tablet 0  . pravastatin (PRAVACHOL) 20 MG tablet Take 1 tablet (20 mg total) by mouth daily. 90 tablet 1   No facility-administered medications prior to visit.    ROS Review of Systems  Constitutional: Positive for unexpected weight change (wt gain). Negative for chills, diaphoresis and fatigue.  HENT: Negative.   Eyes: Negative.   Respiratory: Negative for cough, chest tightness, shortness of breath and wheezing.   Cardiovascular: Negative for chest pain, palpitations and leg swelling.  Gastrointestinal: Negative for abdominal pain, constipation, diarrhea, nausea and vomiting.  Endocrine: Negative.   Genitourinary: Negative.  Negative  for difficulty urinating.  Musculoskeletal: Negative for arthralgias and myalgias.  Skin: Negative.  Negative for color change and pallor.  Neurological: Negative.  Negative for dizziness, weakness and light-headedness.  Hematological: Negative for adenopathy. Does not bruise/bleed easily.  Psychiatric/Behavioral: Negative.     Objective:  BP (!) 144/78 (BP Location: Left Arm, Patient Position: Sitting, Cuff Size: Normal)   Pulse 90   Temp 98.5 F (36.9 C) (Oral)   Resp 16   Ht 5\' 6"  (1.676 m)   Wt 214 lb (97.1 kg)   SpO2 95%   BMI 34.54 kg/m   BP Readings from Last 3 Encounters:  05/09/20 (!) 144/78  11/09/19 136/82  03/30/19 139/80    Wt Readings from Last 3 Encounters:  05/09/20 214 lb (97.1 kg)  11/09/19 211 lb 8 oz (95.9 kg)  03/30/19 210 lb (95.3 kg)    Physical Exam Vitals reviewed.  Constitutional:      Appearance: He is obese.  HENT:     Nose: Nose normal.     Mouth/Throat:     Mouth: Mucous membranes are moist.  Eyes:     General: No scleral icterus.    Conjunctiva/sclera: Conjunctivae normal.  Cardiovascular:     Rate and Rhythm: Normal rate and regular rhythm.     Heart sounds: No murmur heard.   Pulmonary:     Effort: Pulmonary effort is normal.     Breath sounds: No stridor. No wheezing, rhonchi or rales.  Abdominal:     General: Abdomen is protuberant. Bowel  sounds are normal. There is no distension.     Palpations: Abdomen is soft. There is no hepatomegaly or splenomegaly.     Tenderness: There is no abdominal tenderness.  Musculoskeletal:        General: Normal range of motion.     Cervical back: Neck supple.     Right lower leg: No edema.     Left lower leg: No edema.  Lymphadenopathy:     Cervical: No cervical adenopathy.  Skin:    General: Skin is warm and dry.     Coloration: Skin is not pale.  Neurological:     General: No focal deficit present.     Mental Status: He is alert.  Psychiatric:        Mood and Affect: Mood normal.         Behavior: Behavior normal.     Lab Results  Component Value Date   WBC 6.2 11/09/2019   HGB 14.7 11/09/2019   HCT 43.3 11/09/2019   PLT 208.0 11/09/2019   GLUCOSE 110 (H) 05/09/2020   CHOL 181 11/09/2019   TRIG 130.0 11/09/2019   HDL 53.90 11/09/2019   LDLDIRECT 113.0 10/15/2018   LDLCALC 101 (H) 11/09/2019   ALT 39 11/09/2019   AST 25 11/09/2019   NA 138 05/09/2020   K 3.9 05/09/2020   CL 100 05/09/2020   CREATININE 1.02 05/09/2020   BUN 16 05/09/2020   CO2 27 05/09/2020   TSH 0.62 11/09/2019   PSA 0.10 11/09/2019   HGBA1C 5.9 11/09/2019    DG Shoulder Left  Result Date: 03/02/2019 CLINICAL DATA:  Left shoulder pain. EXAM: LEFT SHOULDER - 2+ VIEW COMPARISON:  None. FINDINGS: There is no evidence of fracture or dislocation. There is no evidence of arthropathy or other focal bone abnormality. Soft tissues are unremarkable. IMPRESSION: Negative. Electronically Signed   By: Dorise Bullion III M.D   On: 03/02/2019 21:53    Assessment & Plan:   Jayleon was seen today for hypertension.  Diagnoses and all orders for this visit:  Essential hypertension- His blood pressure is not adequately well controlled.  I recommended that he improve his lifestyle modifications and to continue the combination of an ARB and thiazide diuretic.  His electrolytes and renal function are normal. -     BASIC METABOLIC PANEL WITH GFR; Future -     BASIC METABOLIC PANEL WITH GFR  Vitamin D deficiency disease -     VITAMIN D 25 Hydroxy (Vit-D Deficiency, Fractures); Future -     VITAMIN D 25 Hydroxy (Vit-D Deficiency, Fractures)  Need for shingles vaccine -     Discontinue: Zoster Vaccine Adjuvanted [facility]) injection; Inject 0.5 mLs into the muscle once for 1 dose. -     Varicella-zoster vaccine IM (Shingrix)   I am having Brennden Masten "[name]" maintain his levocetirizine, aspirin, doxazosin, pravastatin, Edarbyclor, potassium chloride, and Vitamin D3.  Meds ordered this encounter   Medications  . DISCONTD: Zoster Vaccine Adjuvanted [facility]) injection    Sig: Inject 0.5 mLs into the muscle once for 1 dose.    Dispense:  0.5 mL    Refill:  1     Follow-up: Return in about 3 months (around 08/09/2020).  Scarlette Calico, MD

## 2020-05-09 NOTE — Patient Instructions (Signed)

## 2020-05-23 ENCOUNTER — Other Ambulatory Visit: Payer: Self-pay | Admitting: Internal Medicine

## 2020-05-23 DIAGNOSIS — E785 Hyperlipidemia, unspecified: Secondary | ICD-10-CM

## 2020-05-31 ENCOUNTER — Other Ambulatory Visit: Payer: Self-pay | Admitting: Internal Medicine

## 2020-05-31 DIAGNOSIS — N138 Other obstructive and reflux uropathy: Secondary | ICD-10-CM

## 2020-06-01 ENCOUNTER — Other Ambulatory Visit: Payer: Self-pay | Admitting: Internal Medicine

## 2020-06-01 DIAGNOSIS — I10 Essential (primary) hypertension: Secondary | ICD-10-CM

## 2020-06-14 ENCOUNTER — Ambulatory Visit (INDEPENDENT_AMBULATORY_CARE_PROVIDER_SITE_OTHER): Payer: Managed Care, Other (non HMO)

## 2020-06-14 ENCOUNTER — Other Ambulatory Visit: Payer: Self-pay

## 2020-06-14 ENCOUNTER — Ambulatory Visit
Admission: EM | Admit: 2020-06-14 | Discharge: 2020-06-14 | Disposition: A | Discharge status: Home / Self Care | Payer: Managed Care, Other (non HMO) | Attending: Physician Assistant | Admitting: Physician Assistant

## 2020-06-14 DIAGNOSIS — S60552A Superficial foreign body of left hand, initial encounter: Secondary | ICD-10-CM | POA: Diagnosis not present

## 2020-06-14 DIAGNOSIS — W458XXA Other foreign body or object entering through skin, initial encounter: Secondary | ICD-10-CM | POA: Diagnosis not present

## 2020-06-14 DIAGNOSIS — M795 Residual foreign body in soft tissue: Secondary | ICD-10-CM

## 2020-06-14 MED ORDER — CEPHALEXIN 500 MG PO CAPS: 500.0000 mg | ORAL_CAPSULE | Freq: Four times a day (QID) | ORAL | 0 refills | Status: AC

## 2020-06-14 NOTE — ED Provider Notes (Signed)
[facility]    CSN: [id number] Arrival date & time: 06/14/20  1412      History   Chief Complaint Chief Complaint  Patient presents with  . Hand Injury    HPI Steven Hickman is a 64 y.o. male.   64 year old male comes in for left hand injury/forein body. Has staple to the left 5th MCP/finger when using staple gun. No numbness/tingling. Tetanus up to date.     Past Medical History:  Diagnosis Date  . Allergy   . Arthritis   . GERD (gastroesophageal reflux disease)   . History of nephrolithiasis   . Hyperlipidemia   . Hypertension     Patient Active Problem List   Diagnosis Date Noted  . Prediabetes 11/09/2019  . Spinal stenosis in cervical region 10/15/2018  . Diuretic-induced hypokalemia 04/24/2018  . Vitamin D deficiency disease 03/14/2018  . Allergic rhinitis 02/20/2016  . Herniated lumbar intervertebral disc 05/22/2013  . Routine general medical examination at a health care facility 12/03/2011  . OSA (obstructive sleep apnea) 02/14/2011  . Dyslipidemia, goal LDL below 100 10/11/2008  . Essential hypertension 10/11/2008  . GERD 10/11/2008  . BPH with obstruction/lower urinary tract symptoms 10/11/2008    Past Surgical History:  Procedure Laterality Date  . KIDNEY STONE SURGERY    . TOOTH EXTRACTION         Home Medications    Prior to Admission medications   Medication Sig Start Date End Date Taking? Authorizing Provider  aspirin 81 MG chewable tablet Chew 1 tablet (81 mg total) by mouth daily. 11/11/19   Janith Lima, MD  cephALEXin (KEFLEX) 500 MG capsule Take 1 capsule (500 mg total) by mouth 4 (four) times daily for 5 days. 06/14/20 06/19/20  Ok Edwards, PA-C  Cholecalciferol (VITAMIN D3) 1.25 MG (50000 UT) CAPS TAKE 1 CAPSULE BY MOUTH ONE TIME PER WEEK 04/27/20   Janith Lima, MD  doxazosin (CARDURA) 2 MG tablet TAKE 1 TABLET BY MOUTH EVERY DAY 05/31/20   Janith Lima, MD  EDARBYCLOR 40-12.5 MG TABS TAKE 1 TABLET BY MOUTH DAILY  06/01/20   Janith Lima, MD  levocetirizine (XYZAL) 5 MG tablet TAKE 1 TABLET (5 MG TOTAL) BY MOUTH EVERY EVENING. 02/20/19   Janith Lima, MD  potassium chloride (KLOR-CON) 10 MEQ tablet TAKE 1 TABLET (10 MEQ TOTAL) BY MOUTH 3 (THREE) TIMES DAILY. 04/20/20   Janith Lima, MD  pravastatin (PRAVACHOL) 20 MG tablet TAKE 1 TABLET BY MOUTH EVERY DAY 05/23/20   Janith Lima, MD  Cholecalciferol 1.25 MG (50000 UT) capsule Take 1 capsule (50,000 Units total) by mouth once a week. 11/10/19   Janith Lima, MD  potassium chloride (KLOR-CON) 10 MEQ tablet Take 1 tablet (10 mEq total) by mouth 3 (three) times daily. 01/27/20   Janith Lima, MD  pravastatin (PRAVACHOL) 20 MG tablet Take 1 tablet (20 mg total) by mouth daily. 11/11/19   Janith Lima, MD    Family History Family History  Problem Relation Age of Onset  . Parkinson's disease Sister   . Arthritis Other   . Diabetes Other   . Hyperlipidemia Other   . Hypertension Other   . Early death Neg Hx   . COPD Neg Hx   . Cancer Neg Hx   . Asthma Neg Hx   . Alcohol abuse Neg Hx   . Heart disease Neg Hx   . Stroke Neg Hx   . Kidney  disease Neg Hx     Social History Social History   Tobacco Use  . Smoking status: Former Smoker    Packs/day: 1.00    Years: 15.00    Pack years: 15.00    Types: Cigarettes  . Smokeless tobacco: Former Systems developer    Types: Chew    Quit date: 10/01/1978  . Tobacco comment: occ smokes 1 cigar per year.  Substance Use Topics  . Alcohol use: Yes    Alcohol/week: 2.0 standard drinks    Types: 2 Cans of beer per week  . Drug use: No     Allergies   Patient has no known allergies.   Review of Systems Review of Systems  Reason unable to perform ROS: See HPI as above.     Physical Exam Triage Vital Signs ED Triage Vitals  Enc Vitals Group     BP 06/14/20 1443 139/72     Pulse Rate 06/14/20 1443 91     Resp 06/14/20 1443 18     Temp 06/14/20 1443 98.2 F (36.8 C)     Temp Source 06/14/20 1443  Oral     SpO2 06/14/20 1443 94 %     Weight --      Height --      Head Circumference --      Peak Flow --      Pain Score 06/14/20 1453 0     Pain Loc --      Pain Edu? --      Excl. in [place]? --    No data found.  Updated Vital Signs BP 139/72 (BP Location: Left Arm)   Pulse 91   Temp 98.2 F (36.8 C) (Oral)   Resp 18   SpO2 94%   Visual Acuity Right Eye Distance:   Left Eye Distance:   Bilateral Distance:    Right Eye Near:   Left Eye Near:    Bilateral Near:     Physical Exam Constitutional:      General: He is not in acute distress.    Appearance: Normal appearance. He is well-developed. He is not toxic-appearing or diaphoretic.  HENT:     Head: Normocephalic and atraumatic.  Eyes:     Conjunctiva/sclera: Conjunctivae normal.     Pupils: Pupils are equal, round, and reactive to light.  Pulmonary:     Effort: Pulmonary effort is normal. No respiratory distress.  Musculoskeletal:     Cervical back: Normal range of motion and neck supple.     Comments: Staple to the left hand with puncture wound to the distal 5th MCP and PIP. Finger slightly flexed at MCP joint due to staple. NVI  Skin:    General: Skin is warm and dry.  Neurological:     Mental Status: He is alert and oriented to person, place, and time.      UC Treatments / Results  Labs (all labs ordered are listed, but only abnormal results are displayed) Labs Reviewed - No data to display  EKG   Radiology DG Hand Complete Left  Result Date: 06/14/2020 CLINICAL DATA:  Staple in hand due to misfiring of staple gun EXAM: LEFT HAND - COMPLETE 3+ VIEW COMPARISON:  None. FINDINGS: Frontal, oblique, and lateral views were obtained. A metallic staple is noted volar to the proximal aspect of the fifth proximal phalanx. The inferior aspect of the staple appears to abut but not directly invade the volar aspect of the proximal portion of the fifth proximal phalanx. No other  radiopaque foreign body evident. No  dislocation. There is narrowing of the radiocarpal joint as well as narrowing of the scaphotrapezial joint. There is mild osteoarthritic change in third and fourth DIP joints. A benign cystic areas noted in the distal ulna laterally. There is calcification in the triangular fibrocartilage region which may represent residua of old trauma. There is a positive ulnar variance. IMPRESSION: Staple in volar aspect of hand at the level of the fifth proximal phalanx. The inferior aspect of the staple appears to abut but not frankly invade the bone in this area. No other radiopaque foreign body. No fracture or dislocation evident. 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 Positive ulnar variance evident. These results will be called to the ordering clinician or representative by the Radiologist Assistant, and communication documented in the PACS or [employer]. Electronically Signed   By: Lowella Grip III M.D.   On: 06/14/2020 15:27    Procedures Foreign Body Removal  Date/Time: 06/14/2020 5:27 PM Performed by: Ok Edwards, PA-C Authorized by: Ok Edwards, PA-C   Consent:    Consent obtained:  Verbal   Consent given by:  Patient   Risks discussed:  Bleeding, infection, nerve damage, poor cosmetic result, worsening of condition and incomplete removal   Alternatives discussed:  Referral Location:    Location:  Hand   Hand location:  L palm Pre-procedure details:    Imaging:  X-ray   Neurovascular status: intact     Preparation: Patient was prepped and draped in usual sterile fashion   Anesthesia (see MAR for exact dosages):    Anesthesia method:  Local infiltration   Local anesthetic:  Lidocaine 1% w/o epi Procedure type:    Procedure complexity:  Simple Procedure details:    Dissection of underlying tissues: no     Removal mechanism:  Forceps   Foreign bodies recovered:  1   Description:  Staple   Intact foreign body removal: yes   Post-procedure details:    Neurovascular status:  intact     Confirmation:  No additional foreign bodies on visualization   Skin closure:  None   Dressing:  Bulky dressing and splint for protection   Patient tolerance of procedure:  Tolerated well, no immediate complications   (including critical care time)  Medications Ordered in UC Medications - No data to display  Initial Impression / Assessment and Plan / UC Course  I have reviewed the triage vital signs and the nursing notes.  Pertinent labs & imaging results that were available during my care of the patient were reviewed by me and considered in my medical decision making (see chart for details).    Discussed x-ray results with patient.  Patient tolerated procedure well, full range of motion of finger post removal.  Given puncture wound with foreign body, will start short course of Keflex to prevent infection.  Wound care instructions given.  Return precautions given.    Final Clinical Impressions(s) / UC Diagnoses   Final diagnoses:  Foreign body of left hand, initial encounter    ED Prescriptions    Medication Sig Dispense Auth. Provider   cephALEXin (KEFLEX) 500 MG capsule Take 1 capsule (500 mg total) by mouth 4 (four) times daily for 5 days. 20 capsule Ok Edwards, PA-C     PDMP not reviewed this encounter.   Ok Edwards, PA-C 06/14/20 1729

## 2020-06-14 NOTE — ED Triage Notes (Signed)
Pt states has a staple in lt hand from a staple gun.

## 2020-06-14 NOTE — Discharge Instructions (Signed)
Foreign body removed.  X-ray of the hand did not show any bony injury.  Start Keflex to prevent infection.  Ice compress to help with swelling.  Ibuprofen 3 times a day if needed for pain and inflammation.  If having trouble moving the finger, numbness/tingling, follow-up with hand orthopedic for further evaluation.

## 2020-07-16 ENCOUNTER — Other Ambulatory Visit: Payer: Self-pay | Admitting: Internal Medicine

## 2020-07-16 DIAGNOSIS — E559 Vitamin D deficiency, unspecified: Secondary | ICD-10-CM

## 2020-08-08 ENCOUNTER — Ambulatory Visit (INDEPENDENT_AMBULATORY_CARE_PROVIDER_SITE_OTHER): Payer: Self-pay

## 2020-08-08 ENCOUNTER — Other Ambulatory Visit: Payer: Self-pay

## 2020-08-08 DIAGNOSIS — Z23 Encounter for immunization: Secondary | ICD-10-CM

## 2020-08-08 NOTE — Progress Notes (Signed)
2nd Zoster Vacc given w/o any complications.

## 2020-10-05 ENCOUNTER — Other Ambulatory Visit: Payer: Self-pay | Admitting: Internal Medicine

## 2020-10-05 DIAGNOSIS — E559 Vitamin D deficiency, unspecified: Secondary | ICD-10-CM

## 2020-10-09 ENCOUNTER — Other Ambulatory Visit: Payer: Self-pay | Admitting: Internal Medicine

## 2020-10-09 DIAGNOSIS — I10 Essential (primary) hypertension: Secondary | ICD-10-CM

## 2020-10-09 DIAGNOSIS — E876 Hypokalemia: Secondary | ICD-10-CM

## 2020-10-10 MED ORDER — POTASSIUM CHLORIDE ER 10 MEQ PO TBCR: 10.0000 meq | EXTENDED_RELEASE_TABLET | Freq: Three times a day (TID) | ORAL | 0 refills | Status: DC

## 2020-11-12 ENCOUNTER — Other Ambulatory Visit: Payer: Self-pay | Admitting: Internal Medicine

## 2020-11-12 DIAGNOSIS — E785 Hyperlipidemia, unspecified: Secondary | ICD-10-CM

## 2020-11-30 ENCOUNTER — Other Ambulatory Visit: Payer: Self-pay | Admitting: Internal Medicine

## 2020-11-30 DIAGNOSIS — I10 Essential (primary) hypertension: Secondary | ICD-10-CM

## 2020-12-05 ENCOUNTER — Other Ambulatory Visit: Payer: Self-pay | Admitting: Internal Medicine

## 2020-12-05 DIAGNOSIS — N138 Other obstructive and reflux uropathy: Secondary | ICD-10-CM

## 2021-01-11 ENCOUNTER — Other Ambulatory Visit: Payer: Self-pay | Admitting: Internal Medicine

## 2021-01-11 DIAGNOSIS — I10 Essential (primary) hypertension: Secondary | ICD-10-CM

## 2021-01-11 DIAGNOSIS — E876 Hypokalemia: Secondary | ICD-10-CM

## 2021-01-11 DIAGNOSIS — T502X5A Adverse effect of carbonic-anhydrase inhibitors, benzothiadiazides and other diuretics, initial encounter: Secondary | ICD-10-CM

## 2021-02-07 ENCOUNTER — Other Ambulatory Visit: Payer: Self-pay

## 2021-02-08 ENCOUNTER — Ambulatory Visit (INDEPENDENT_AMBULATORY_CARE_PROVIDER_SITE_OTHER): Payer: No Typology Code available for payment source | Admitting: Internal Medicine

## 2021-02-08 ENCOUNTER — Other Ambulatory Visit: Payer: Self-pay

## 2021-02-08 ENCOUNTER — Encounter: Payer: Self-pay | Admitting: Internal Medicine

## 2021-02-08 VITALS — BP 142/76 | HR 83 | Temp 98.3°F | Resp 16 | Ht 66.0 in | Wt 214.0 lb

## 2021-02-08 DIAGNOSIS — R7303 Prediabetes: Secondary | ICD-10-CM

## 2021-02-08 DIAGNOSIS — E785 Hyperlipidemia, unspecified: Secondary | ICD-10-CM | POA: Diagnosis not present

## 2021-02-08 DIAGNOSIS — E876 Hypokalemia: Secondary | ICD-10-CM

## 2021-02-08 DIAGNOSIS — E559 Vitamin D deficiency, unspecified: Secondary | ICD-10-CM | POA: Diagnosis not present

## 2021-02-08 DIAGNOSIS — I10 Essential (primary) hypertension: Secondary | ICD-10-CM

## 2021-02-08 DIAGNOSIS — T502X5A Adverse effect of carbonic-anhydrase inhibitors, benzothiadiazides and other diuretics, initial encounter: Secondary | ICD-10-CM

## 2021-02-08 DIAGNOSIS — Z125 Encounter for screening for malignant neoplasm of prostate: Secondary | ICD-10-CM | POA: Diagnosis not present

## 2021-02-08 DIAGNOSIS — N138 Other obstructive and reflux uropathy: Secondary | ICD-10-CM

## 2021-02-08 DIAGNOSIS — N401 Enlarged prostate with lower urinary tract symptoms: Secondary | ICD-10-CM | POA: Diagnosis not present

## 2021-02-08 DIAGNOSIS — Z0000 Encounter for general adult medical examination without abnormal findings: Secondary | ICD-10-CM

## 2021-02-08 DIAGNOSIS — N410 Acute prostatitis: Secondary | ICD-10-CM

## 2021-02-08 LAB — CBC WITH DIFFERENTIAL/PLATELET
Basophils Absolute: 0.1 10*3/uL (ref 0.0–0.1)
Basophils Relative: 0.8 % (ref 0.0–3.0)
Eosinophils Absolute: 0.1 10*3/uL (ref 0.0–0.7)
Eosinophils Relative: 0.6 % (ref 0.0–5.0)
HCT: 45.7 % (ref 39.0–52.0)
Hemoglobin: 15.6 g/dL (ref 13.0–17.0)
Lymphocytes Relative: 26.4 % (ref 12.0–46.0)
Lymphs Abs: 2.3 10*3/uL (ref 0.7–4.0)
MCHC: 34.1 g/dL (ref 30.0–36.0)
MCV: 89.8 fl (ref 78.0–100.0)
Monocytes Absolute: 0.7 10*3/uL (ref 0.1–1.0)
Monocytes Relative: 8.1 % (ref 3.0–12.0)
Neutro Abs: 5.6 10*3/uL (ref 1.4–7.7)
Neutrophils Relative %: 64.1 % (ref 43.0–77.0)
Platelets: 210 10*3/uL (ref 150.0–400.0)
RBC: 5.08 Mil/uL (ref 4.22–5.81)
RDW: 13.8 % (ref 11.5–15.5)
WBC: 8.7 10*3/uL (ref 4.0–10.5)

## 2021-02-08 LAB — LIPID PANEL
Cholesterol: 158 mg/dL (ref 0–200)
HDL: 56.5 mg/dL (ref >39.00)
LDL Cholesterol: 79 mg/dL (ref 0–99)
NonHDL: 101.45
Total CHOL/HDL Ratio: 3
Triglycerides: 110 mg/dL (ref 0.0–149.0)
VLDL: 22 mg/dL (ref 0.0–40.0)

## 2021-02-08 LAB — URINALYSIS, ROUTINE W REFLEX MICROSCOPIC
Bilirubin Urine: NEGATIVE
Hgb urine dipstick: NEGATIVE
Ketones, ur: NEGATIVE
Leukocytes,Ua: NEGATIVE
Nitrite: NEGATIVE
RBC / HPF: NONE SEEN (ref 0–?)
Specific Gravity, Urine: 1.015 (ref 1.000–1.030)
Total Protein, Urine: NEGATIVE
Urine Glucose: NEGATIVE
Urobilinogen, UA: 0.2 (ref 0.0–1.0)
pH: 7.5 (ref 5.0–8.0)

## 2021-02-08 LAB — HEPATIC FUNCTION PANEL
ALT: 31 U/L (ref 0–53)
AST: 23 U/L (ref 0–37)
Albumin: 4.4 g/dL (ref 3.5–5.2)
Alkaline Phosphatase: 77 U/L (ref 39–117)
Bilirubin, Direct: 0.1 mg/dL (ref 0.0–0.3)
Total Bilirubin: 0.4 mg/dL (ref 0.2–1.2)
Total Protein: 7.5 g/dL (ref 6.0–8.3)

## 2021-02-08 LAB — TSH: TSH: 0.98 u[IU]/mL (ref 0.35–4.50)

## 2021-02-08 LAB — BASIC METABOLIC PANEL
BUN: 17 mg/dL (ref 6–23)
CO2: 31 mEq/L (ref 19–32)
Calcium: 9.6 mg/dL (ref 8.4–10.5)
Chloride: 97 mEq/L (ref 96–112)
Creatinine, Ser: 0.95 mg/dL (ref 0.40–1.50)
GFR: 85.41 mL/min (ref >60.00)
Glucose, Bld: 113 mg/dL — ABNORMAL HIGH (ref 70–99)
Potassium: 4 mEq/L (ref 3.5–5.1)
Sodium: 138 mEq/L (ref 135–145)

## 2021-02-08 LAB — HEMOGLOBIN A1C: Hgb A1c MFr Bld: 6.2 % (ref 4.6–6.5)

## 2021-02-08 LAB — VITAMIN D 25 HYDROXY (VIT D DEFICIENCY, FRACTURES): VITD: 58.17 ng/mL (ref 30.00–100.00)

## 2021-02-08 LAB — PSA: PSA: 0.5 ng/mL (ref 0.10–4.00)

## 2021-02-08 MED ORDER — SULFAMETHOXAZOLE-TRIMETHOPRIM 800-160 MG PO TABS: 1.0000 | ORAL_TABLET | Freq: Two times a day (BID) | ORAL | 0 refills | Status: AC

## 2021-02-08 NOTE — Patient Instructions (Signed)

## 2021-02-08 NOTE — Progress Notes (Signed)
Subjective:  [name], male    DOB: 07/10/1956  Age: 65 y.o. MRN: [id number]  CC: Annual Exam and Hypertension  This visit occurred during the SARS-CoV-2 public health emergency.  Safety protocols were in place, including screening questions prior to the visit, additional usage of staff PPE, and extensive cleaning of exam room while observing appropriate contact time as indicated for disinfecting solutions.    HPI Steven Hickman presents for a CPX and f/up -   He runs on a treadmill several times a week and does not experience CP, DOE, palpitations, edema, fatigue, or diaphoresis.  Outpatient Medications Prior to Visit  Medication Sig Dispense Refill  . aspirin 81 MG chewable tablet Chew 1 tablet (81 mg total) by mouth daily. 90 tablet 1  . doxazosin (CARDURA) 2 MG tablet TAKE 1 TABLET BY MOUTH EVERY DAY 90 tablet 1  . EDARBYCLOR 40-12.5 MG TABS TAKE 1 TABLET BY MOUTH ONCE DAILY 90 tablet 1  . potassium chloride (KLOR-CON) 10 MEQ tablet TAKE 1 TABLET (10 MEQ TOTAL) BY MOUTH 3 (THREE) TIMES DAILY. 270 tablet 0  . pravastatin (PRAVACHOL) 20 MG tablet TAKE 1 TABLET BY MOUTH EVERY DAY 90 tablet 1  . Cholecalciferol (VITAMIN D3) 1.25 MG (50000 UT) CAPS TAKE 1 CAPSULE BY MOUTH ONE TIME PER WEEK 12 capsule 0  . levocetirizine (XYZAL) 5 MG tablet TAKE 1 TABLET (5 MG TOTAL) BY MOUTH EVERY EVENING. 90 tablet 3   No facility-administered medications prior to visit.    ROS Review of Systems  Constitutional: Negative for appetite change, diaphoresis, fatigue and unexpected weight change.  HENT: Negative.   Eyes: Negative.   Respiratory: Negative for cough, chest tightness, shortness of breath and wheezing.   Cardiovascular: Negative for chest pain, palpitations and leg swelling.  Gastrointestinal: Negative for abdominal pain, constipation, diarrhea, nausea and vomiting.  Endocrine: Negative.   Genitourinary: Positive for difficulty urinating. Negative for dysuria, frequency,  penile pain, penile swelling, scrotal swelling and testicular pain.       Weak urine stream and nocturia 3 times per night  Musculoskeletal: Negative.  Negative for arthralgias.  Skin: Negative.  Negative for color change.  Neurological: Negative.  Negative for dizziness, weakness and headaches.  Hematological: Negative for adenopathy. Does not bruise/bleed easily.  Psychiatric/Behavioral: Negative.     Objective:  BP (!) 142/76 (BP Location: Right Arm, Patient Position: Sitting, Cuff Size: Large)   Pulse 83   Temp 98.3 F (36.8 C) (Oral)   Resp 16   Ht 5\' 6"  (1.676 m)   Wt 214 lb (97.1 kg)   SpO2 96%   BMI 34.54 kg/m   BP Readings from Last 3 Encounters:  02/08/21 (!) 142/76  06/14/20 139/72  05/09/20 (!) 144/78    Wt Readings from Last 3 Encounters:  02/08/21 214 lb (97.1 kg)  05/09/20 214 lb (97.1 kg)  11/09/19 211 lb 8 oz (95.9 kg)    Physical Exam Vitals reviewed.  Constitutional:      Appearance: Normal appearance.  HENT:     Nose: Nose normal.     Mouth/Throat:     Mouth: Mucous membranes are moist.  Eyes:     General: No scleral icterus.    Conjunctiva/sclera: Conjunctivae normal.  Cardiovascular:     Rate and Rhythm: Normal rate and regular rhythm.     Heart sounds: No murmur heard.     Comments: EKG - NSR, 68 bpm Normal EKG Pulmonary:     Effort: Pulmonary effort is  normal.     Breath sounds: No wheezing, rhonchi or rales.  Abdominal:     General: Abdomen is protuberant. Bowel sounds are normal. There is no distension.     Palpations: Abdomen is soft. There is no hepatomegaly, splenomegaly or mass.     Tenderness: There is no abdominal tenderness. There is no guarding.     Hernia: No hernia is present.  Genitourinary:    Pubic Area: No rash.      Penis: Normal and circumcised.      Testes: Normal.     Prostate: Enlarged. Not tender and no nodules present.     Rectum: Normal. Guaiac result negative. No mass, tenderness, anal fissure, external  hemorrhoid or internal hemorrhoid. Normal anal tone.  Musculoskeletal:     Cervical back: Neck supple.     Right lower leg: No edema.     Left lower leg: No edema.  Lymphadenopathy:     Cervical: No cervical adenopathy.  Skin:    General: Skin is warm and dry.  Neurological:     General: No focal deficit present.     Mental Status: He is alert.  Psychiatric:        Mood and Affect: Mood normal.        Behavior: Behavior normal.     Lab Results  Component Value Date   WBC 8.7 02/08/2021   HGB 15.6 02/08/2021   HCT 45.7 02/08/2021   PLT 210.0 02/08/2021   GLUCOSE 113 (H) 02/08/2021   CHOL 158 02/08/2021   TRIG 110.0 02/08/2021   HDL 56.50 02/08/2021   LDLDIRECT 113.0 10/15/2018   LDLCALC 79 02/08/2021   ALT 31 02/08/2021   AST 23 02/08/2021   NA 138 02/08/2021   K 4.0 02/08/2021   CL 97 02/08/2021   CREATININE 0.95 02/08/2021   BUN 17 02/08/2021   CO2 31 02/08/2021   TSH 0.98 02/08/2021   PSA 0.50 02/08/2021   HGBA1C 6.2 02/08/2021    DG Hand Complete Left  Result Date: 06/14/2020 CLINICAL DATA:  Staple in hand due to misfiring of staple gun EXAM: LEFT HAND - COMPLETE 3+ VIEW COMPARISON:  None. FINDINGS: Frontal, oblique, and lateral views were obtained. A metallic staple is noted volar to the proximal aspect of the fifth proximal phalanx. The inferior aspect of the staple appears to abut but not directly invade the volar aspect of the proximal portion of the fifth proximal phalanx. No other radiopaque foreign body evident. No dislocation. There is narrowing of the radiocarpal joint as well as narrowing of the scaphotrapezial joint. There is mild osteoarthritic change in third and fourth DIP joints. A benign cystic areas noted in the distal ulna laterally. There is calcification in the triangular fibrocartilage region which may represent residua of old trauma. There is a positive ulnar variance. IMPRESSION: Staple in volar aspect of hand at the level of the fifth proximal  phalanx. The inferior aspect of the staple appears to abut but not frankly invade the bone in this area. No other radiopaque foreign body. No fracture or dislocation evident. 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 Positive ulnar variance evident. These results will be called to the ordering clinician or representative by the Radiologist Assistant, and communication documented in the PACS or [employer]. Electronically Signed   By: Lowella Grip III M.D.   On: 06/14/2020 15:27    Assessment & Plan:   Tion was seen today for annual exam and hypertension.  Diagnoses and all orders for this  visit:  Essential hypertension- His blood pressure is not adequately well controlled.  Will continue the current meds and he will improve his lifestyle modifications. -     CBC with Differential/Platelet; Future -     Basic metabolic panel; Future -     TSH; Future -     Urinalysis, Routine w reflex microscopic; Future -     EKG 12-Lead -     Urinalysis, Routine w reflex microscopic -     TSH -     Basic metabolic panel -     CBC with Differential/Platelet  Prediabetes- His A1c is up to 6.2%.  I recommended that he improve his lifestyle modifications. -     Basic metabolic panel; Future -     Hemoglobin A1c; Future -     Hemoglobin A1c -     Basic metabolic panel  Routine general medical examination at a health care facility- Exam completed, labs reviewed, vaccines reviewed, cancer screenings are up-to-date, patient education material was given. -     Lipid panel; Future -     PSA; Future -     PSA -     Lipid panel  Vitamin D deficiency disease -     VITAMIN D 25 Hydroxy (Vit-D Deficiency, Fractures); Future -     VITAMIN D 25 Hydroxy (Vit-D Deficiency, Fractures) -     Cholecalciferol (VITAMIN D3) 1.25 MG (50000 UT) CAPS; Take 1 capsule by mouth once a week.  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 -     Hepatic function panel; Future -     Hepatic function panel  BPH with obstruction/lower urinary tract symptoms -     Urinalysis, Routine w reflex microscopic; Future -     Urinalysis, Routine w reflex microscopic  Acute bacterial prostatitis -     sulfamethoxazole-trimethoprim (BACTRIM DS) 800-160 MG tablet; Take 1 tablet by mouth 2 (two) times daily.   I have discontinued Ragan Reale "Carl"'s levocetirizine. I have also changed his Vitamin D3. Additionally, I am having him start on sulfamethoxazole-trimethoprim. Lastly, I am having him maintain his aspirin, pravastatin, Edarbyclor, doxazosin, and potassium chloride.  Meds ordered this encounter  Medications  . sulfamethoxazole-trimethoprim (BACTRIM DS) 800-160 MG tablet    Sig: Take 1 tablet by mouth 2 (two) times daily.    Dispense:  60 tablet    Refill:  0  . Cholecalciferol (VITAMIN D3) 1.25 MG (50000 UT) CAPS    Sig: Take 1 capsule by mouth once a week.    Dispense:  12 capsule    Refill:  0     Follow-up: Return in about 6 months (around 08/11/2021).  Scarlette Calico, MD

## 2021-02-09 ENCOUNTER — Other Ambulatory Visit: Payer: Self-pay | Admitting: Internal Medicine

## 2021-02-09 DIAGNOSIS — E559 Vitamin D deficiency, unspecified: Secondary | ICD-10-CM

## 2021-02-09 MED ORDER — VITAMIN D3 1.25 MG (50000 UT) PO CAPS
1.0000 | ORAL_CAPSULE | ORAL | 0 refills | Status: DC
Start: 2021-02-09 — End: 2021-04-30

## 2021-04-15 ENCOUNTER — Other Ambulatory Visit: Payer: Self-pay | Admitting: Internal Medicine

## 2021-04-15 DIAGNOSIS — I10 Essential (primary) hypertension: Secondary | ICD-10-CM

## 2021-04-15 DIAGNOSIS — E876 Hypokalemia: Secondary | ICD-10-CM

## 2021-04-30 ENCOUNTER — Other Ambulatory Visit: Payer: Self-pay | Admitting: Internal Medicine

## 2021-04-30 DIAGNOSIS — E559 Vitamin D deficiency, unspecified: Secondary | ICD-10-CM

## 2021-05-23 ENCOUNTER — Other Ambulatory Visit: Payer: Self-pay | Admitting: Internal Medicine

## 2021-05-23 DIAGNOSIS — E559 Vitamin D deficiency, unspecified: Secondary | ICD-10-CM

## 2021-05-23 DIAGNOSIS — N410 Acute prostatitis: Secondary | ICD-10-CM

## 2021-06-07 ENCOUNTER — Other Ambulatory Visit: Payer: Self-pay | Admitting: Internal Medicine

## 2021-06-07 DIAGNOSIS — I10 Essential (primary) hypertension: Secondary | ICD-10-CM

## 2021-08-09 ENCOUNTER — Other Ambulatory Visit: Payer: Self-pay | Admitting: Internal Medicine

## 2021-08-09 DIAGNOSIS — I10 Essential (primary) hypertension: Secondary | ICD-10-CM

## 2021-08-09 DIAGNOSIS — E559 Vitamin D deficiency, unspecified: Secondary | ICD-10-CM

## 2021-08-09 DIAGNOSIS — E876 Hypokalemia: Secondary | ICD-10-CM

## 2021-08-17 ENCOUNTER — Other Ambulatory Visit: Payer: Self-pay | Admitting: Internal Medicine

## 2021-08-17 DIAGNOSIS — N401 Enlarged prostate with lower urinary tract symptoms: Secondary | ICD-10-CM

## 2021-08-22 ENCOUNTER — Ambulatory Visit (INDEPENDENT_AMBULATORY_CARE_PROVIDER_SITE_OTHER): Payer: No Typology Code available for payment source | Admitting: Internal Medicine

## 2021-08-22 ENCOUNTER — Other Ambulatory Visit: Payer: Self-pay

## 2021-08-22 ENCOUNTER — Encounter: Payer: Self-pay | Admitting: Internal Medicine

## 2021-08-22 VITALS — BP 146/84 | HR 88 | Temp 98.5°F | Resp 16 | Ht 66.0 in | Wt 207.0 lb

## 2021-08-22 DIAGNOSIS — N401 Enlarged prostate with lower urinary tract symptoms: Secondary | ICD-10-CM

## 2021-08-22 DIAGNOSIS — R7303 Prediabetes: Secondary | ICD-10-CM

## 2021-08-22 DIAGNOSIS — I10 Essential (primary) hypertension: Secondary | ICD-10-CM | POA: Diagnosis not present

## 2021-08-22 DIAGNOSIS — E785 Hyperlipidemia, unspecified: Secondary | ICD-10-CM | POA: Diagnosis not present

## 2021-08-22 DIAGNOSIS — N138 Other obstructive and reflux uropathy: Secondary | ICD-10-CM

## 2021-08-22 DIAGNOSIS — Z1211 Encounter for screening for malignant neoplasm of colon: Secondary | ICD-10-CM

## 2021-08-22 DIAGNOSIS — E876 Hypokalemia: Secondary | ICD-10-CM

## 2021-08-22 DIAGNOSIS — T502X5A Adverse effect of carbonic-anhydrase inhibitors, benzothiadiazides and other diuretics, initial encounter: Secondary | ICD-10-CM

## 2021-08-22 DIAGNOSIS — T50905A Adverse effect of unspecified drugs, medicaments and biological substances, initial encounter: Secondary | ICD-10-CM

## 2021-08-22 LAB — HEMOGLOBIN A1C: Hgb A1c MFr Bld: 6.1 % (ref 4.6–6.5)

## 2021-08-22 LAB — BASIC METABOLIC PANEL
BUN: 14 mg/dL (ref 6–23)
CO2: 32 mEq/L (ref 19–32)
Calcium: 10.6 mg/dL — ABNORMAL HIGH (ref 8.4–10.5)
Chloride: 97 mEq/L (ref 96–112)
Creatinine, Ser: 0.96 mg/dL (ref 0.40–1.50)
GFR: 84.03 mL/min (ref >60.00)
Glucose, Bld: 105 mg/dL — ABNORMAL HIGH (ref 70–99)
Potassium: 4.1 mEq/L (ref 3.5–5.1)
Sodium: 138 mEq/L (ref 135–145)

## 2021-08-22 MED ORDER — EDARBYCLOR 40-12.5 MG PO TABS: 1.0000 | ORAL_TABLET | Freq: Every day | ORAL | 1 refills | Status: DC

## 2021-08-22 MED ORDER — DOXAZOSIN MESYLATE 2 MG PO TABS: 2.0000 mg | ORAL_TABLET | Freq: Every day | ORAL | 1 refills | Status: DC

## 2021-08-22 MED ORDER — PRAVASTATIN SODIUM 20 MG PO TABS: 20.0000 mg | ORAL_TABLET | Freq: Every day | ORAL | 1 refills | Status: DC

## 2021-08-22 MED ORDER — POTASSIUM CHLORIDE ER 10 MEQ PO TBCR: 10.0000 meq | EXTENDED_RELEASE_TABLET | Freq: Three times a day (TID) | ORAL | 0 refills | Status: DC

## 2021-08-22 NOTE — Patient Instructions (Signed)

## 2021-08-22 NOTE — Progress Notes (Signed)
Subjective:  [name], male    DOB: [birth year]  Age: 65 y.o. MRN: [id number]  CC: Hypertension  This visit occurred during the SARS-CoV-2 public health emergency.  Safety protocols were in place, including screening questions prior to the visit, additional usage of staff PPE, and extensive cleaning of exam room while observing appropriate contact time as indicated for disinfecting solutions.    HPI Steven Hickman presents for f/up -   He exercises on a treadmill for nearly an hour each day.  He does not experience chest pain, shortness of breath, diaphoresis, edema, dizziness, or lightheadedness.  Outpatient Medications Prior to Visit  Medication Sig Dispense Refill   Cholecalciferol (VITAMIN D3) 1.25 MG (50000 UT) CAPS TAKE 1 CAPSULE BY MOUTH ONE TIME PER WEEK 12 capsule 0   doxazosin (CARDURA) 2 MG tablet TAKE 1 TABLET BY MOUTH EVERY DAY 90 tablet 1   EDARBYCLOR 40-12.5 MG TABS TAKE 1 TABLET BY MOUTH ONCE DAILY 90 tablet 0   potassium chloride (KLOR-CON) 10 MEQ tablet TAKE 1 TABLET (10 MEQ TOTAL) BY MOUTH 3 (THREE) TIMES DAILY. 270 tablet 0   pravastatin (PRAVACHOL) 20 MG tablet TAKE 1 TABLET BY MOUTH EVERY DAY 90 tablet 1   aspirin 81 MG chewable tablet Chew 1 tablet (81 mg total) by mouth daily. 90 tablet 1   No facility-administered medications prior to visit.    ROS Review of Systems  Constitutional:  Negative for diaphoresis, fatigue and unexpected weight change.  HENT: Negative.    Eyes: Negative.   Respiratory:  Negative for cough, chest tightness, shortness of breath and wheezing.   Cardiovascular:  Negative for chest pain, palpitations and leg swelling.  Gastrointestinal:  Negative for abdominal pain.  Endocrine: Negative.   Genitourinary: Negative.  Negative for difficulty urinating.  Musculoskeletal: Negative.   Skin: Negative.   Neurological:  Negative for dizziness, weakness and light-headedness.  Hematological:  Negative for adenopathy. Does not  bruise/bleed easily.  Psychiatric/Behavioral: Negative.     Objective:  BP (!) 146/84 (BP Location: Left Arm, Patient Position: Sitting, Cuff Size: Large)   Pulse 88   Temp 98.5 F (36.9 C) (Oral)   Resp 16   Ht 5\' 6"  (1.676 m)   Wt 207 lb (93.9 kg)   SpO2 97%   BMI 33.41 kg/m   BP Readings from Last 3 Encounters:  08/22/21 (!) 146/84  02/08/21 (!) 142/76  06/14/20 139/72    Wt Readings from Last 3 Encounters:  08/22/21 207 lb (93.9 kg)  02/08/21 214 lb (97.1 kg)  05/09/20 214 lb (97.1 kg)    Physical Exam Vitals reviewed.  HENT:     Nose: Nose normal.     Mouth/Throat:     Mouth: Mucous membranes are moist.  Eyes:     General: No scleral icterus.    Conjunctiva/sclera: Conjunctivae normal.  Cardiovascular:     Rate and Rhythm: Normal rate and regular rhythm.     Heart sounds: No murmur heard. Pulmonary:     Effort: Pulmonary effort is normal.     Breath sounds: No stridor. No wheezing, rhonchi or rales.  Abdominal:     General: Abdomen is protuberant. Bowel sounds are normal. There is no distension.     Palpations: Abdomen is soft. There is no hepatomegaly, splenomegaly or mass.     Tenderness: There is no abdominal tenderness.  Musculoskeletal:        General: Normal range of motion.     Cervical back: Neck supple.  Right lower leg: No edema.     Left lower leg: No edema.  Lymphadenopathy:     Cervical: No cervical adenopathy.  Skin:    General: Skin is warm and dry.  Neurological:     General: No focal deficit present.     Mental Status: He is alert.    Lab Results  Component Value Date   WBC 8.7 02/08/2021   HGB 15.6 02/08/2021   HCT 45.7 02/08/2021   PLT 210.0 02/08/2021   GLUCOSE 105 (H) 08/22/2021   CHOL 158 02/08/2021   TRIG 110.0 02/08/2021   HDL 56.50 02/08/2021   LDLDIRECT 113.0 10/15/2018   LDLCALC 79 02/08/2021   ALT 31 02/08/2021   AST 23 02/08/2021   NA 138 08/22/2021   K 4.1 08/22/2021   CL 97 08/22/2021   CREATININE 0.96  08/22/2021   BUN 14 08/22/2021   CO2 32 08/22/2021   TSH 0.98 02/08/2021   PSA 0.50 02/08/2021   HGBA1C 6.1 08/22/2021    DG Hand Complete Left  Result Date: 06/14/2020 CLINICAL DATA:  Staple in hand due to misfiring of staple gun EXAM: LEFT HAND - COMPLETE 3+ VIEW COMPARISON:  None. FINDINGS: Frontal, oblique, and lateral views were obtained. A metallic staple is noted volar to the proximal aspect of the fifth proximal phalanx. The inferior aspect of the staple appears to abut but not directly invade the volar aspect of the proximal portion of the fifth proximal phalanx. No other radiopaque foreign body evident. No dislocation. There is narrowing of the radiocarpal joint as well as narrowing of the scaphotrapezial joint. There is mild osteoarthritic change in third and fourth DIP joints. A benign cystic areas noted in the distal ulna laterally. There is calcification in the triangular fibrocartilage region which may represent residua of old trauma. There is a positive ulnar variance. IMPRESSION: Staple in volar aspect of hand at the level of the fifth proximal phalanx. The inferior aspect of the staple appears to abut but not frankly invade the bone in this area. No other radiopaque foreign body. No fracture or dislocation evident. 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 Positive ulnar variance evident. These results will be called to the ordering clinician or representative by the Radiologist Assistant, and communication documented in the PACS or [employer]. Electronically Signed   By: Lowella Grip III M.D.   On: 06/14/2020 15:27    Assessment & Plan:   Steven Hickman was seen today for hypertension.  Diagnoses and all orders for this visit:  Essential hypertension- His blood pressure is adequately well controlled.  He has developed hypercalcemia.  This is likely related to the thiazide diuretic.  He is asymptomatic.  I have asked him to return to have this rechecked.  If this persists  then we will need to change to a different antihypertensive. -     Basic metabolic panel; Future -     potassium chloride (KLOR-CON) 10 MEQ tablet; Take 1 tablet (10 mEq total) by mouth 3 (three) times daily. -     EDARBYCLOR 40-12.5 MG TABS; Take 1 tablet by mouth daily. -     Basic metabolic panel  Prediabetes- His A1c is at 6.1%.  Medical therapy is not yet indicated. -     Basic metabolic panel; Future -     Hemoglobin A1c; Future -     Hemoglobin A1c -     Basic metabolic panel  BPH with obstruction/lower urinary tract symptoms -     doxazosin (CARDURA)  2 MG tablet; Take 1 tablet (2 mg total) by mouth daily.  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  Diuretic-induced hypokalemia- His K+ level is normal now. -     potassium chloride (KLOR-CON) 10 MEQ tablet; Take 1 tablet (10 mEq total) by mouth 3 (three) times daily.  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 I have also changed his doxazosin, pravastatin, and Edarbyclor. Additionally, I am having him maintain his Vitamin D3 and potassium chloride.  Meds ordered this encounter  Medications   doxazosin (CARDURA) 2 MG tablet    Sig: Take 1 tablet (2 mg total) by mouth daily.    Dispense:  90 tablet    Refill:  1   pravastatin (PRAVACHOL) 20 MG tablet    Sig: Take 1 tablet (20 mg total) by mouth daily.    Dispense:  90 tablet    Refill:  1   potassium chloride (KLOR-CON) 10 MEQ tablet    Sig: Take 1 tablet (10 mEq total) by mouth 3 (three) times daily.    Dispense:  270 tablet    Refill:  0   EDARBYCLOR 40-12.5 MG TABS    Sig: Take 1 tablet by mouth daily.    Dispense:  90 tablet    Refill:  1     Follow-up: Return in about 6 months (around 02/19/2022).  Scarlette Calico, MD

## 2021-08-23 ENCOUNTER — Other Ambulatory Visit: Payer: Self-pay | Admitting: Internal Medicine

## 2021-08-23 DIAGNOSIS — T50905A Adverse effect of unspecified drugs, medicaments and biological substances, initial encounter: Secondary | ICD-10-CM | POA: Insufficient documentation

## 2021-08-23 DIAGNOSIS — E559 Vitamin D deficiency, unspecified: Secondary | ICD-10-CM

## 2021-08-28 MED ORDER — VITAMIN D3 1.25 MG (50000 UT) PO CAPS: 1.0000 | ORAL_CAPSULE | ORAL | 0 refills | Status: DC

## 2021-08-31 ENCOUNTER — Other Ambulatory Visit: Payer: Self-pay | Admitting: Internal Medicine

## 2021-08-31 DIAGNOSIS — E559 Vitamin D deficiency, unspecified: Secondary | ICD-10-CM

## 2021-09-05 ENCOUNTER — Ambulatory Visit: Payer: No Typology Code available for payment source | Admitting: Internal Medicine

## 2021-09-06 ENCOUNTER — Other Ambulatory Visit: Payer: Self-pay | Admitting: Internal Medicine

## 2021-09-06 DIAGNOSIS — I10 Essential (primary) hypertension: Secondary | ICD-10-CM

## 2021-10-06 LAB — COLOGUARD: COLOGUARD: POSITIVE — AB

## 2021-10-07 ENCOUNTER — Encounter: Payer: Self-pay | Admitting: Internal Medicine

## 2021-10-07 ENCOUNTER — Other Ambulatory Visit: Payer: Self-pay | Admitting: Internal Medicine

## 2021-10-07 DIAGNOSIS — R195 Other fecal abnormalities: Secondary | ICD-10-CM | POA: Insufficient documentation

## 2021-10-24 ENCOUNTER — Encounter: Payer: Self-pay | Admitting: Internal Medicine

## 2021-11-01 ENCOUNTER — Other Ambulatory Visit: Payer: Self-pay

## 2021-11-01 ENCOUNTER — Encounter: Payer: Self-pay | Admitting: Internal Medicine

## 2021-11-01 ENCOUNTER — Ambulatory Visit (INDEPENDENT_AMBULATORY_CARE_PROVIDER_SITE_OTHER): Payer: No Typology Code available for payment source | Admitting: Internal Medicine

## 2021-11-01 VITALS — BP 136/78 | HR 71 | Temp 98.0°F | Resp 16 | Ht 66.0 in | Wt 205.2 lb

## 2021-11-01 DIAGNOSIS — T502X5A Adverse effect of carbonic-anhydrase inhibitors, benzothiadiazides and other diuretics, initial encounter: Secondary | ICD-10-CM | POA: Diagnosis not present

## 2021-11-01 DIAGNOSIS — E876 Hypokalemia: Secondary | ICD-10-CM

## 2021-11-01 DIAGNOSIS — I10 Essential (primary) hypertension: Secondary | ICD-10-CM

## 2021-11-01 DIAGNOSIS — T50905A Adverse effect of unspecified drugs, medicaments and biological substances, initial encounter: Secondary | ICD-10-CM

## 2021-11-01 LAB — BASIC METABOLIC PANEL
BUN: 14 mg/dL (ref 6–23)
CO2: 32 mEq/L (ref 19–32)
Calcium: 9.7 mg/dL (ref 8.4–10.5)
Chloride: 100 mEq/L (ref 96–112)
Creatinine, Ser: 0.88 mg/dL (ref 0.40–1.50)
GFR: 91.28 mL/min (ref >60.00)
Glucose, Bld: 103 mg/dL — ABNORMAL HIGH (ref 70–99)
Potassium: 3.9 mEq/L (ref 3.5–5.1)
Sodium: 138 mEq/L (ref 135–145)

## 2021-11-01 NOTE — Progress Notes (Signed)
Subjective:  [name], male    DOB: 08/11/1956  Age: 66 y.o. MRN: [id number]  CC: Hypertension  This visit occurred during the SARS-CoV-2 public health emergency.  Safety protocols were in place, including screening questions prior to the visit, additional usage of staff PPE, and extensive cleaning of exam room while observing appropriate contact time as indicated for disinfecting solutions.    HPI Steven Hickman presents for f/up - he exercises and does not experience DOE, CP, SOB.  Outpatient Medications Prior to Visit  Medication Sig Dispense Refill   Cholecalciferol (VITAMIN D3) 1.25 MG (50000 UT) CAPS TAKE 1 CAPSULE BY MOUTH ONE TIME PER WEEK 12 capsule 0   doxazosin (CARDURA) 2 MG tablet Take 1 tablet (2 mg total) by mouth daily. 90 tablet 1   EDARBYCLOR 40-12.5 MG TABS TAKE 1 TABLET BY MOUTH ONCE DAILY 90 tablet 0   potassium chloride (KLOR-CON) 10 MEQ tablet Take 1 tablet (10 mEq total) by mouth 3 (three) times daily. 270 tablet 0   pravastatin (PRAVACHOL) 20 MG tablet Take 1 tablet (20 mg total) by mouth daily. 90 tablet 1   No facility-administered medications prior to visit.    ROS Review of Systems  Constitutional:  Negative for diaphoresis, fatigue and unexpected weight change.  HENT: Negative.    Eyes: Negative.   Respiratory:  Negative for cough, chest tightness, shortness of breath and wheezing.   Cardiovascular:  Negative for chest pain, palpitations and leg swelling.  Gastrointestinal:  Negative for abdominal pain, constipation, diarrhea, nausea and vomiting.  Endocrine: Negative.   Genitourinary: Negative.  Negative for difficulty urinating.  Musculoskeletal: Negative.   Skin: Negative.  Negative for color change.  Neurological:  Negative for dizziness, weakness and light-headedness.  Hematological:  Negative for adenopathy. Does not bruise/bleed easily.  Psychiatric/Behavioral: Negative.     Objective:  BP 136/78 (BP Location: Left Arm, Patient  Position: Sitting, Cuff Size: Large)    Pulse 71    Temp 98 F (36.7 C) (Oral)    Resp 16    Ht 5\' 6"  (1.676 m)    Wt 205 lb 4 oz (93.1 kg)    SpO2 96%    BMI 33.13 kg/m   BP Readings from Last 3 Encounters:  11/01/21 136/78  08/22/21 (!) 146/84  02/08/21 (!) 142/76    Wt Readings from Last 3 Encounters:  11/01/21 205 lb 4 oz (93.1 kg)  08/22/21 207 lb (93.9 kg)  02/08/21 214 lb (97.1 kg)    Physical Exam Vitals reviewed.  Constitutional:      Appearance: He is not ill-appearing.  HENT:     Nose: Nose normal.     Mouth/Throat:     Mouth: Mucous membranes are moist.  Eyes:     General: No scleral icterus.    Conjunctiva/sclera: Conjunctivae normal.  Cardiovascular:     Rate and Rhythm: Normal rate and regular rhythm.     Heart sounds: No murmur heard. Pulmonary:     Effort: Pulmonary effort is normal.     Breath sounds: No stridor. No wheezing, rhonchi or rales.  Abdominal:     General: Abdomen is flat.     Palpations: There is no mass.     Tenderness: There is no abdominal tenderness. There is no guarding.  Musculoskeletal:        General: Normal range of motion.     Cervical back: Neck supple.     Right lower leg: No edema.  Left lower leg: No edema.  Lymphadenopathy:     Cervical: No cervical adenopathy.  Skin:    General: Skin is warm and dry.  Neurological:     General: No focal deficit present.     Mental Status: He is alert.  Psychiatric:        Mood and Affect: Mood normal.        Behavior: Behavior normal.    Lab Results  Component Value Date   WBC 8.7 02/08/2021   HGB 15.6 02/08/2021   HCT 45.7 02/08/2021   PLT 210.0 02/08/2021   GLUCOSE 103 (H) 11/01/2021   CHOL 158 02/08/2021   TRIG 110.0 02/08/2021   HDL 56.50 02/08/2021   LDLDIRECT 113.0 10/15/2018   LDLCALC 79 02/08/2021   ALT 31 02/08/2021   AST 23 02/08/2021   NA 138 11/01/2021   K 3.9 11/01/2021   CL 100 11/01/2021   CREATININE 0.88 11/01/2021   BUN 14 11/01/2021   CO2 32  11/01/2021   TSH 0.98 02/08/2021   PSA 0.50 02/08/2021   HGBA1C 6.1 08/22/2021    DG Hand Complete Left  Result Date: 06/14/2020 CLINICAL DATA:  Staple in hand due to misfiring of staple gun EXAM: LEFT HAND - COMPLETE 3+ VIEW COMPARISON:  None. FINDINGS: Frontal, oblique, and lateral views were obtained. A metallic staple is noted volar to the proximal aspect of the fifth proximal phalanx. The inferior aspect of the staple appears to abut but not directly invade the volar aspect of the proximal portion of the fifth proximal phalanx. No other radiopaque foreign body evident. No dislocation. There is narrowing of the radiocarpal joint as well as narrowing of the scaphotrapezial joint. There is mild osteoarthritic change in third and fourth DIP joints. A benign cystic areas noted in the distal ulna laterally. There is calcification in the triangular fibrocartilage region which may represent residua of old trauma. There is a positive ulnar variance. IMPRESSION: Staple in volar aspect of hand at the level of the fifth proximal phalanx. The inferior aspect of the staple appears to abut but not frankly invade the bone in this area. No other radiopaque foreign body. No fracture or dislocation evident. 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 Positive ulnar variance evident. These results will be called to the ordering clinician or representative by the Radiologist Assistant, and communication documented in the PACS or [employer]. Electronically Signed   By: Lowella Grip III M.D.   On: 06/14/2020 15:27    Assessment & Plan:   Steven Hickman was seen today for hypertension.  Diagnoses and all orders for this visit:  Essential hypertension- His BP is well controlled. -     Basic metabolic panel; Future -     Cancel: Magnesium; Future -     Basic metabolic panel  Diuretic-induced hypokalemia- K+ is normal now. 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 -     Basic metabolic panel; Future -     PTH, intact and calcium; Future -     PTH, intact and calcium -     Basic metabolic panel   I am having Steven Hickman "Steven Hickman" maintain his doxazosin, pravastatin, potassium chloride, Vitamin D3, and Edarbyclor.  No orders of the defined types were placed in this encounter.    Follow-up: Return in about 4 months (around 03/01/2022).  Scarlette Calico, MD

## 2021-11-01 NOTE — Patient Instructions (Signed)
Hypercalcemia Hypercalcemia is when the level of calcium in a person's blood is above normal. The body needs calcium to make bones and keep them strong. Calcium also helps the muscles, nerves, brain, and heart work the way they should. Most of the calcium in the body is stored in the bones. There is also calcium in the blood. Hypercalcemia occurs when there is too much calcium in your blood. Calcium levels in the blood are regulated by hormones, kidneys, and the gastrointestinal tract.  Hypercalcemia can happen when calcium comes out of the bones, or when the kidneys are not able to remove calcium from the blood. Hypercalcemia can be mild or severe. What are the causes? There are many possible causes of hypercalcemia. Common causes of this condition include: Hyperparathyroidism. This is a condition in which the body produces too much parathyroid hormone. There are four parathyroid glands in your neck. These glands produce a chemical messenger (hormone) that helps the body absorb calcium from foods and helps your bones release calcium. Certain kinds of cancer. Less common causes of hypercalcemia include: Calcium and vitamin D dietary supplements. Chronic kidney disease. Hyperthyroidism. Severe dehydration. Being on bed rest or being inactive for a long time. Certain medicines. Infections. What increases the risk? You are more likely to develop this condition if: You are male. You are 42 years of age or older. You have a family history of hypercalcemia. What are the signs or symptoms? Mild hypercalcemia that starts slowly may not cause symptoms. Severe, sudden hypercalcemia is more likely to cause symptoms, such as: Being more thirsty than usual. Needing to urinate more often than usual. Abdominal pain. Nausea and vomiting. Constipation. Muscle pain, twitching, or weakness. Feeling very tired. How is this diagnosed? Hypercalcemia is usually diagnosed with a blood test. You may also  have tests to help check what is causing this condition. Tests include imaging tests and more blood tests. How is this treated? Treatment for hypercalcemia depends on the cause. Treatment may include: Receiving fluids through an IV. Medicines. These can be used to: Keep calcium levels steady after receiving fluids (loop diuretics). Keep calcium in your bones (bisphosphonates). Lower the calcium level in your blood. Surgery to remove overactive parathyroid glands. A procedure that filters your blood to correct calcium levels (hemodialysis). Follow these instructions at home:  Take over-the-counter and prescription medicines only as told by your health care provider. Follow instructions from your health care provider about eating or drinking restrictions. Drink enough fluid to keep your urine pale yellow. Stay active. Weight-bearing exercise helps to keep calcium in your bones. Follow instructions from your health care provider about what type and level of exercise is safe for you. Keep all follow-up visits. This is important. Contact a health care provider if: You have a fever. Your heartbeat is irregular or very fast. You have changes in mood, memory, or personality. Get help right away if: You have severe abdominal pain. You have chest pain. You have trouble breathing. You become very confused and sleepy. You lose consciousness. These symptoms may represent a serious problem that is an emergency. Do not wait to see if the symptoms will go away. Get medical help right away. Call your local emergency services (911 in the [place] Do not drive yourself to the hospital. Summary Hypercalcemia is when the level of calcium in a person's blood is above normal. The body needs calcium to make bones and keep them strong. There are many possible causes of hypercalcemia, and treatment depends on the  cause. Take over-the-counter and prescription medicines only as told by your health care  provider. This information is not intended to replace advice given to you by your health care provider. Make sure you discuss any questions you have with your health care provider. Document Revised: 02/22/2021 Document Reviewed: 02/22/2021 Elsevier Patient Education  2022 [employer].

## 2021-11-03 LAB — PTH, INTACT AND CALCIUM
Calcium: 6.8 mg/dL — ABNORMAL LOW (ref 8.6–10.3)
PTH: 25 pg/mL (ref 16–77)

## 2021-11-28 ENCOUNTER — Other Ambulatory Visit: Payer: Self-pay

## 2021-11-28 ENCOUNTER — Ambulatory Visit (AMBULATORY_SURGERY_CENTER): Payer: No Typology Code available for payment source

## 2021-11-28 ENCOUNTER — Other Ambulatory Visit: Payer: Self-pay | Admitting: Internal Medicine

## 2021-11-28 VITALS — Ht 66.0 in | Wt 205.0 lb

## 2021-11-28 DIAGNOSIS — R195 Other fecal abnormalities: Secondary | ICD-10-CM

## 2021-11-28 DIAGNOSIS — E559 Vitamin D deficiency, unspecified: Secondary | ICD-10-CM

## 2021-11-28 NOTE — Progress Notes (Signed)

## 2021-11-29 ENCOUNTER — Other Ambulatory Visit: Payer: Self-pay | Admitting: Internal Medicine

## 2021-11-29 DIAGNOSIS — I10 Essential (primary) hypertension: Secondary | ICD-10-CM

## 2021-12-05 ENCOUNTER — Encounter: Payer: Self-pay | Admitting: Internal Medicine

## 2021-12-06 ENCOUNTER — Other Ambulatory Visit: Payer: Self-pay | Admitting: Internal Medicine

## 2021-12-06 DIAGNOSIS — E876 Hypokalemia: Secondary | ICD-10-CM

## 2021-12-06 DIAGNOSIS — T502X5A Adverse effect of carbonic-anhydrase inhibitors, benzothiadiazides and other diuretics, initial encounter: Secondary | ICD-10-CM

## 2021-12-06 DIAGNOSIS — I10 Essential (primary) hypertension: Secondary | ICD-10-CM

## 2021-12-06 MED ORDER — POTASSIUM CHLORIDE ER 10 MEQ PO TBCR: 10.0000 meq | EXTENDED_RELEASE_TABLET | Freq: Three times a day (TID) | ORAL | 0 refills | Status: DC

## 2021-12-11 ENCOUNTER — Encounter: Payer: Self-pay | Admitting: Certified Registered Nurse Anesthetist

## 2021-12-12 ENCOUNTER — Ambulatory Visit (AMBULATORY_SURGERY_CENTER): Payer: No Typology Code available for payment source | Admitting: Internal Medicine

## 2021-12-12 ENCOUNTER — Encounter: Payer: Self-pay | Admitting: Internal Medicine

## 2021-12-12 VITALS — BP 137/66 | HR 64 | Temp 97.5°F | Resp 15 | Ht 66.0 in | Wt 205.0 lb

## 2021-12-12 DIAGNOSIS — D120 Benign neoplasm of cecum: Secondary | ICD-10-CM

## 2021-12-12 DIAGNOSIS — D125 Benign neoplasm of sigmoid colon: Secondary | ICD-10-CM

## 2021-12-12 DIAGNOSIS — R195 Other fecal abnormalities: Secondary | ICD-10-CM | POA: Diagnosis present

## 2021-12-12 MED ORDER — SODIUM CHLORIDE 0.9 % IV SOLN: 500.0000 mL | Freq: Once | INTRAVENOUS | Status: DC

## 2021-12-12 NOTE — Patient Instructions (Addendum)
There were 2 polyps - both small and look benign. I removed them. ?All else ok. ? ?I will let you know pathology results and when to have another routine colonoscopy by mail and/or My Chart. ? ?I appreciate the opportunity to care for you. ?Gatha Mayer, MD, [facility] ? ? ?YOU HAD AN ENDOSCOPIC PROCEDURE TODAY AT [facility]:   Refer to the procedure report that was given to you for any specific questions about what was found during the examination.  If the procedure report does not answer your questions, please call your gastroenterologist to clarify.  If you requested that your care partner not be given the details of your procedure findings, then the procedure report has been included in a sealed envelope for you to review at your convenience later. ? ?YOU SHOULD EXPECT: Some feelings of bloating in the abdomen. Passage of more gas than usual.  Walking can help get rid of the air that was put into your GI tract during the procedure and reduce the bloating. If you had a lower endoscopy (such as a colonoscopy or flexible sigmoidoscopy) you may notice spotting of blood in your stool or on the toilet paper. If you underwent a bowel prep for your procedure, you may not have a normal bowel movement for a few days. ? ?Please Note:  You might notice some irritation and congestion in your nose or some drainage.  This is from the oxygen used during your procedure.  There is no need for concern and it should clear up in a day or so. ? ?SYMPTOMS TO REPORT IMMEDIATELY: ? ?Following lower endoscopy (colonoscopy or flexible sigmoidoscopy): ? Excessive amounts of blood in the stool ? Significant tenderness or worsening of abdominal pains ? Swelling of the abdomen that is new, acute ? Fever of 100?F or higher ? ?For urgent or emergent issues, a gastroenterologist can be reached at any hour by calling [phone]. ?Do not use MyChart messaging for urgent concerns.  ? ? ?DIET:  We do recommend a small meal at  first, but then you may proceed to your regular diet.  Drink plenty of fluids but you should avoid alcoholic beverages for 24 hours. ? ?ACTIVITY:  You should plan to take it easy for the rest of today and you should NOT DRIVE or use heavy machinery until tomorrow (because of the sedation medicines used during the test).   ? ?FOLLOW UP: ?Our staff will call the number listed on your records 48-72 hours following your procedure to check on you and address any questions or concerns that you may have regarding the information given to you following your procedure. If we do not reach you, we will leave a message.  We will attempt to reach you two times.  During this call, we will ask if you have developed any symptoms of COVID 19. If you develop any symptoms (ie: fever, flu-like symptoms, shortness of breath, cough etc.) before then, please call [phone].  If you test positive for Covid 19 in the 2 weeks post procedure, please call and report this information to [place].   ? ?If any biopsies were taken you will be contacted by phone or by letter within the next 1-3 weeks.  Please call us at [phone] if you have not heard about the biopsies in 3 weeks.  ? ? ?SIGNATURES/CONFIDENTIALITY: ?You and/or your care partner have signed paperwork which will be entered into your electronic medical record.  These signatures attest to the fact  that that the information above on your After Visit Summary has been reviewed and is understood.  Full responsibility of the confidentiality of this discharge information lies with you and/or your care-partner.  ?

## 2021-12-12 NOTE — Progress Notes (Signed)
VS  DT ? ?Pt's states no medical or surgical changes since previsit or office visit. ? ?

## 2021-12-12 NOTE — Op Note (Signed)
[facility] ?Patient Name: Steven Hickman ?Procedure Date: 12/12/2021 12:09 PM ?MRN: [id number] ?Endoscopist: Gatha Mayer , MD ?Age: 66 ?Referring MD:  ?Date of Birth: [birth year] ?Gender: Male ?Account #: [id number] ?Procedure:                Colonoscopy ?Indications:              Positive Cologuard test ?Medicines:                Propofol per Anesthesia, Monitored Anesthesia Care ?Procedure:                Pre-Anesthesia Assessment: ?                          - Prior to the procedure, a History and Physical  ?                          was performed, and patient medications and  ?                          allergies were reviewed. The patient's tolerance of  ?                          previous anesthesia was also reviewed. The risks  ?                          and benefits of the procedure and the sedation  ?                          options and risks were discussed with the patient.  ?                          All questions were answered, and informed consent  ?                          was obtained. Prior Anticoagulants: The patient has  ?                          taken no previous anticoagulant or antiplatelet  ?                          agents. ASA Grade Assessment: II - A patient with  ?                          mild systemic disease. After reviewing the risks  ?                          and benefits, the patient was deemed in  ?                          satisfactory condition to undergo the procedure. ?                          After obtaining informed consent, the colonoscope  ?  was passed under direct vision. Throughout the  ?                          procedure, the patient's blood pressure, pulse, and  ?                          oxygen saturations were monitored continuously. The  ?                          CF [id number] #[id number] was introduced through the anus  ?                          and advanced to the the cecum, identified by  ?                          appendiceal  orifice and ileocecal valve. The  ?                          colonoscopy was performed without difficulty. The  ?                          patient tolerated the procedure well. The quality  ?                          of the bowel preparation was good. The ileocecal  ?                          valve, appendiceal orifice, and rectum were  ?                          photographed. The bowel preparation used was  ?                          Miralax via split dose instruction. ?Scope In: 12:16:44 PM ?Scope Out: 12:30:57 PM ?Scope Withdrawal Time: 0 hours 12 minutes 24 seconds  ?Total Procedure Duration: 0 hours 14 minutes 13 seconds  ?Findings:                 The perianal and digital rectal examinations were  ?                          normal. ?                          A 5 mm polyp was found in the distal sigmoid colon.  ?                          The polyp was sessile. The polyp was removed with a  ?                          cold snare. Resection and retrieval were complete.  ?                          Verification of patient identification for the  ?  specimen was done. Estimated blood loss was minimal. ?                          A 1 mm polyp was found in the cecum. The polyp was  ?                          sessile. The polyp was removed with a cold biopsy  ?                          forceps. Resection and retrieval were complete.  ?                          Verification of patient identification for the  ?                          specimen was done. Estimated blood loss was minimal. ?                          The exam was otherwise without abnormality on  ?                          direct and retroflexion views. ?Complications:            No immediate complications. ?Estimated Blood Loss:     Estimated blood loss was minimal. ?Impression:               - One 5 mm polyp in the distal sigmoid colon,  ?                          removed with a cold snare. Resected and retrieved. ?                           - One 1 mm polyp in the cecum, removed with a cold  ?                          biopsy forceps. Resected and retrieved. ?                          - The examination was otherwise normal on direct  ?                          and retroflexion views. ?Recommendation:           - Patient has a contact number available for  ?                          emergencies. The signs and symptoms of potential  ?                          delayed complications were discussed with the  ?                          patient. Return to normal activities tomorrow.  ?  Written discharge instructions were provided to the  ?                          patient. ?                          - Resume previous diet. ?                          - Continue present medications. ?                          - Await pathology results. ?                          - Repeat colonoscopy is recommended. The  ?                          colonoscopy date will be determined after pathology  ?                          results from today's exam become available for  ?                          review. ?Gatha Mayer, MD ?12/12/2021 12:35:34 PM ?This report has been signed electronically. ?

## 2021-12-12 NOTE — Progress Notes (Signed)
[facility] History and Physical ? ? ?Primary Care Physician:  Janith Lima, MD ? ? ?Reason for Procedure:   + cologuard ? ?Plan:    colonoscopy ? ? ? ? ?HPI: Steven Hickman is a 66 y.o. male here to evaluate + Colguard ?2011 colonoscopy negative ? ?Past Medical History:  ?Diagnosis Date  ? Allergy   ? Arthritis   ? GERD (gastroesophageal reflux disease)   ? History of nephrolithiasis   ? Hyperlipidemia   ? Hypertension   ? ? ?Past Surgical History:  ?Procedure Laterality Date  ? FOOT SURGERY    ? KIDNEY STONE SURGERY    ? TOOTH EXTRACTION    ? ? ?Prior to Admission medications   ?Medication Sig Start Date End Date Taking? Authorizing Provider  ?aspirin EC 81 MG tablet Take 81 mg by mouth daily. Swallow whole.   Yes [provider]  ?Cholecalciferol (VITAMIN D3) 1.25 MG (50000 UT) CAPS TAKE 1 CAPSULE BY MOUTH ONE TIME PER WEEK 11/28/21  Yes Janith Lima, MD  ?doxazosin (CARDURA) 2 MG tablet Take 1 tablet (2 mg total) by mouth daily. 08/22/21  Yes Janith Lima, MD  ?EDARBYCLOR 40-12.5 MG TABS TAKE 1 TABLET BY MOUTH ONCE DAILY 11/29/21  Yes Janith Lima, MD  ?Fluticasone Propionate (FLONASE NA) Place into the nose.   Yes [provider]  ?FOLIC ACID PO Take by mouth.   Yes [provider]  ?GARLIC PO Take by mouth.   Yes [provider]  ?Multiple Vitamin (MULTIVITAMIN) capsule Take 1 capsule by mouth daily.   Yes [provider]  ?OMEPRAZOLE PO Take by mouth.   Yes [provider]  ?potassium chloride (KLOR-CON) 10 MEQ tablet Take 1 tablet (10 mEq total) by mouth 3 (three) times daily. 12/06/21  Yes Janith Lima, MD  ?pravastatin (PRAVACHOL) 20 MG tablet Take 1 tablet (20 mg total) by mouth daily. 08/22/21  Yes Janith Lima, MD  ?Probiotic Product (PROBIOTIC PO) Take by mouth.   Yes [provider]  ? ? ?Current Outpatient Medications  ?Medication Sig Dispense Refill  ? aspirin EC 81 MG tablet Take 81 mg by mouth daily. Swallow  whole.    ? Cholecalciferol (VITAMIN D3) 1.25 MG (50000 UT) CAPS TAKE 1 CAPSULE BY MOUTH ONE TIME PER WEEK 12 capsule 0  ? doxazosin (CARDURA) 2 MG tablet Take 1 tablet (2 mg total) by mouth daily. 90 tablet 1  ? EDARBYCLOR 40-12.5 MG TABS TAKE 1 TABLET BY MOUTH ONCE DAILY 90 tablet 1  ? Fluticasone Propionate (FLONASE NA) Place into the nose.    ? FOLIC ACID PO Take by mouth.    ? GARLIC PO Take by mouth.    ? Multiple Vitamin (MULTIVITAMIN) capsule Take 1 capsule by mouth daily.    ? OMEPRAZOLE PO Take by mouth.    ? potassium chloride (KLOR-CON) 10 MEQ tablet Take 1 tablet (10 mEq total) by mouth 3 (three) times daily. 270 tablet 0  ? pravastatin (PRAVACHOL) 20 MG tablet Take 1 tablet (20 mg total) by mouth daily. 90 tablet 1  ? Probiotic Product (PROBIOTIC PO) Take by mouth.    ? ?Current Facility-Administered Medications  ?Medication Dose Route Frequency Provider Last Rate Last Admin  ? 0.9 %  sodium chloride infusion  500 mL Intravenous Once Gatha Mayer, MD      ? ? ?Allergies as of 12/12/2021 - Review Complete 12/12/2021  ?Allergen Reaction Noted  ? Shellfish allergy  11/28/2021  ? ? ?  Family History  ?Problem Relation Age of Onset  ? Parkinson's disease Sister   ? Arthritis Other   ? Diabetes Other   ? Hyperlipidemia Other   ? Hypertension Other   ? Early death Neg Hx   ? COPD Neg Hx   ? Cancer Neg Hx   ? Asthma Neg Hx   ? Alcohol abuse Neg Hx   ? Heart disease Neg Hx   ? Stroke Neg Hx   ? Kidney disease Neg Hx   ? Colon cancer Neg Hx   ? Colon polyps Neg Hx   ? Esophageal cancer Neg Hx   ? Rectal cancer Neg Hx   ? Stomach cancer Neg Hx   ? ? ?Social History  ? ?Socioeconomic History  ? Marital status: Married  ?  Spouse name: Kerrie Buffalo  ? Number of children: Y  ? Years of education: Not on file  ? Highest education level: Not on file  ?Occupational History  ? Occupation: Management consultant  ?Tobacco Use  ? Smoking status: Former  ?  Packs/day: 1.00  ?  Years: 15.00  ?  Pack years: 15.00  ?  Types:  Cigarettes  ? Smokeless tobacco: Former  ?  Types: Chew  ?  Quit date: 10/01/1978  ? Tobacco comments:  ?  occ smokes 1 cigar per year.  ?Vaping Use  ? Vaping Use: Never used  ?Substance and Sexual Activity  ? Alcohol use: Yes  ?  Alcohol/week: 2.0 standard drinks  ?  Types: 2 Cans of beer per week  ? Drug use: No  ? Sexual activity: Yes  ?Other Topics Concern  ? ?Review of Systems: ? ?All other review of systems negative except as mentioned in the HPI. ? ?Physical Exam: ?Vital signs ?BP 135/62   Pulse 86   Temp (!) 97.5 ?F (36.4 ?C)   Ht '5\' 6"'$  (1.676 m)   Wt 205 lb (93 kg)   SpO2 99%   BMI 33.09 kg/m?  ? ?General:   Alert,  Well-developed, well-nourished, pleasant and cooperative in NAD ?Lungs:  Clear throughout to auscultation.   ?Heart:  Regular rate and rhythm; no murmurs, clicks, rubs,  or gallops. ?Abdomen:  Soft, nontender and nondistended. Normal bowel sounds.   ?Neuro/Psych:  Alert and cooperative. Normal mood and affect. A and O x 3 ? ? ?'@Phinehas Grounds'$  Simonne Maffucci, MD, [facility] ?[facility] ?[phone] (pager) ?12/12/2021 12:08 PM@ ? ?

## 2021-12-12 NOTE — Progress Notes (Signed)
Report given to PACU, vss 

## 2021-12-14 ENCOUNTER — Telehealth: Payer: Self-pay

## 2021-12-14 NOTE — Telephone Encounter (Signed)
?  Follow up Call- ? ?Call back number 12/12/2021  ?Post procedure Call Back phone  # [phone]  ?Permission to leave phone message Yes  ?Some recent data might be hidden  ?  ? ?Patient questions: ? ?Do you have a fever, pain , or abdominal swelling? No. ?Pain Score  0 * ? ?Have you tolerated food without any problems? Yes.   ? ?Have you been able to return to your normal activities? Yes.   ? ?Do you have any questions about your discharge instructions: ?Diet   No. ?Medications  No. ?Follow up visit  No. ? ?Do you have questions or concerns about your Care? No. ? ?Actions: ?* If pain score is 4 or above: ?No action needed, pain <4. ? ?Have you developed a fever since your procedure? no ? ?2.   Have you had an respiratory symptoms (SOB or cough) since your procedure? no ? ?3.   Have you tested positive for COVID 19 since your procedure no ? ?4.   Have you had any family members/close contacts diagnosed with the COVID 19 since your procedure?  no ? ? ?If yes to any of these questions please route to Joylene John, RN and Joella Prince, RN ? ? ? ?

## 2021-12-18 ENCOUNTER — Encounter: Payer: Self-pay | Admitting: Internal Medicine

## 2021-12-18 DIAGNOSIS — Z860101 Personal history of adenomatous and serrated colon polyps: Secondary | ICD-10-CM | POA: Insufficient documentation

## 2021-12-18 DIAGNOSIS — Z86010 Personal history of colonic polyps: Secondary | ICD-10-CM

## 2021-12-18 HISTORY — DX: Personal history of colonic polyps: Z86.010

## 2021-12-18 HISTORY — DX: Personal history of adenomatous and serrated colon polyps: Z86.0101

## 2022-05-15 ENCOUNTER — Other Ambulatory Visit: Payer: Self-pay | Admitting: Internal Medicine

## 2022-05-15 DIAGNOSIS — E559 Vitamin D deficiency, unspecified: Secondary | ICD-10-CM

## 2022-05-30 ENCOUNTER — Other Ambulatory Visit: Payer: Self-pay | Admitting: Internal Medicine

## 2022-05-30 DIAGNOSIS — I10 Essential (primary) hypertension: Secondary | ICD-10-CM

## 2022-06-13 ENCOUNTER — Ambulatory Visit (INDEPENDENT_AMBULATORY_CARE_PROVIDER_SITE_OTHER): Payer: No Typology Code available for payment source | Admitting: Internal Medicine

## 2022-06-13 ENCOUNTER — Encounter: Payer: Self-pay | Admitting: Internal Medicine

## 2022-06-13 VITALS — BP 140/76 | HR 88 | Temp 98.0°F | Resp 16 | Ht 66.0 in | Wt 199.0 lb

## 2022-06-13 DIAGNOSIS — E876 Hypokalemia: Secondary | ICD-10-CM

## 2022-06-13 DIAGNOSIS — I10 Essential (primary) hypertension: Secondary | ICD-10-CM | POA: Diagnosis not present

## 2022-06-13 DIAGNOSIS — N401 Enlarged prostate with lower urinary tract symptoms: Secondary | ICD-10-CM | POA: Diagnosis not present

## 2022-06-13 DIAGNOSIS — N138 Other obstructive and reflux uropathy: Secondary | ICD-10-CM

## 2022-06-13 DIAGNOSIS — Z125 Encounter for screening for malignant neoplasm of prostate: Secondary | ICD-10-CM | POA: Diagnosis not present

## 2022-06-13 DIAGNOSIS — E785 Hyperlipidemia, unspecified: Secondary | ICD-10-CM | POA: Diagnosis not present

## 2022-06-13 DIAGNOSIS — T502X5A Adverse effect of carbonic-anhydrase inhibitors, benzothiadiazides and other diuretics, initial encounter: Secondary | ICD-10-CM

## 2022-06-13 DIAGNOSIS — Z0000 Encounter for general adult medical examination without abnormal findings: Secondary | ICD-10-CM

## 2022-06-13 DIAGNOSIS — R7303 Prediabetes: Secondary | ICD-10-CM | POA: Diagnosis not present

## 2022-06-13 DIAGNOSIS — H43393 Other vitreous opacities, bilateral: Secondary | ICD-10-CM

## 2022-06-13 LAB — LIPID PANEL
Cholesterol: 186 mg/dL (ref 0–200)
HDL: 59.6 mg/dL (ref >39.00)
LDL Cholesterol: 90 mg/dL (ref 0–99)
NonHDL: 125.9
Total CHOL/HDL Ratio: 3
Triglycerides: 182 mg/dL — ABNORMAL HIGH (ref 0.0–149.0)
VLDL: 36.4 mg/dL (ref 0.0–40.0)

## 2022-06-13 LAB — BASIC METABOLIC PANEL
BUN: 16 mg/dL (ref 6–23)
CO2: 28 mEq/L (ref 19–32)
Calcium: 10 mg/dL (ref 8.4–10.5)
Chloride: 98 mEq/L (ref 96–112)
Creatinine, Ser: 0.99 mg/dL (ref 0.40–1.50)
GFR: 80.52 mL/min (ref >60.00)
Glucose, Bld: 101 mg/dL — ABNORMAL HIGH (ref 70–99)
Potassium: 3.7 mEq/L (ref 3.5–5.1)
Sodium: 138 mEq/L (ref 135–145)

## 2022-06-13 LAB — CBC WITH DIFFERENTIAL/PLATELET
Basophils Absolute: 0.1 10*3/uL (ref 0.0–0.1)
Basophils Relative: 0.7 % (ref 0.0–3.0)
Eosinophils Absolute: 0 10*3/uL (ref 0.0–0.7)
Eosinophils Relative: 0.5 % (ref 0.0–5.0)
HCT: 45.2 % (ref 39.0–52.0)
Hemoglobin: 15.5 g/dL (ref 13.0–17.0)
Lymphocytes Relative: 31.2 % (ref 12.0–46.0)
Lymphs Abs: 2.6 10*3/uL (ref 0.7–4.0)
MCHC: 34.3 g/dL (ref 30.0–36.0)
MCV: 91.8 fl (ref 78.0–100.0)
Monocytes Absolute: 0.6 10*3/uL (ref 0.1–1.0)
Monocytes Relative: 7.5 % (ref 3.0–12.0)
Neutro Abs: 5 10*3/uL (ref 1.4–7.7)
Neutrophils Relative %: 60.1 % (ref 43.0–77.0)
Platelets: 196 10*3/uL (ref 150.0–400.0)
RBC: 4.92 Mil/uL (ref 4.22–5.81)
RDW: 13.5 % (ref 11.5–15.5)
WBC: 8.4 10*3/uL (ref 4.0–10.5)

## 2022-06-13 LAB — URINALYSIS, ROUTINE W REFLEX MICROSCOPIC
Bilirubin Urine: NEGATIVE
Hgb urine dipstick: NEGATIVE
Ketones, ur: NEGATIVE
Nitrite: NEGATIVE
RBC / HPF: NONE SEEN (ref 0–?)
Specific Gravity, Urine: 1.01 (ref 1.000–1.030)
Total Protein, Urine: NEGATIVE
Urine Glucose: NEGATIVE
Urobilinogen, UA: 0.2 (ref 0.0–1.0)
pH: 7 (ref 5.0–8.0)

## 2022-06-13 LAB — PSA: PSA: 0.81 ng/mL (ref 0.10–4.00)

## 2022-06-13 LAB — HEPATIC FUNCTION PANEL
ALT: 30 U/L (ref 0–53)
AST: 23 U/L (ref 0–37)
Albumin: 4.4 g/dL (ref 3.5–5.2)
Alkaline Phosphatase: 66 U/L (ref 39–117)
Bilirubin, Direct: 0.1 mg/dL (ref 0.0–0.3)
Total Bilirubin: 0.5 mg/dL (ref 0.2–1.2)
Total Protein: 7.7 g/dL (ref 6.0–8.3)

## 2022-06-13 LAB — HEMOGLOBIN A1C: Hgb A1c MFr Bld: 6 % (ref 4.6–6.5)

## 2022-06-13 MED ORDER — PRAVASTATIN SODIUM 20 MG PO TABS: 20.0000 mg | ORAL_TABLET | Freq: Every day | ORAL | 1 refills | Status: DC

## 2022-06-13 NOTE — Progress Notes (Signed)
Subjective:  [name], male    DOB: [birth year]  Age: 66 y.o. MRN: [id number]  CC: Annual Exam, Hypertension, and Hyperlipidemia   HPI Steven Hickman presents for a CPX and f/up -  He walks about 1 mile a day and does not experience chest pain, shortness of breath, diaphoresis, edema, or palpitations.  Outpatient Medications Prior to Visit  Medication Sig Dispense Refill   aspirin EC 81 MG tablet Take 81 mg by mouth daily. Swallow whole.     Cholecalciferol (VITAMIN D3) 1.25 MG (50000 UT) CAPS TAKE 1 CAPSULE BY MOUTH ONE TIME PER WEEK 12 capsule 0   doxazosin (CARDURA) 2 MG tablet Take 1 tablet (2 mg total) by mouth daily. 90 tablet 1   EDARBYCLOR 40-12.5 MG TABS TAKE 1 TABLET BY MOUTH ONCE DAILY 90 tablet 0   Fluticasone Propionate (FLONASE NA) Place into the nose.     FOLIC ACID PO Take by mouth.     GARLIC PO Take by mouth.     Multiple Vitamin (MULTIVITAMIN) capsule Take 1 capsule by mouth daily.     OMEPRAZOLE PO Take by mouth.     Probiotic Product (PROBIOTIC PO) Take by mouth.     potassium chloride (KLOR-CON) 10 MEQ tablet Take 1 tablet (10 mEq total) by mouth 3 (three) times daily. 270 tablet 0   pravastatin (PRAVACHOL) 20 MG tablet Take 1 tablet (20 mg total) by mouth daily. 90 tablet 1   No facility-administered medications prior to visit.    ROS Review of Systems  Constitutional: Negative.  Negative for chills, diaphoresis, fatigue and fever.  HENT: Negative.    Eyes:  Positive for visual disturbance. Negative for redness.       "Floaters"  Respiratory:  Negative for cough, chest tightness and wheezing.   Cardiovascular:  Negative for chest pain, palpitations and leg swelling.  Gastrointestinal:  Negative for abdominal pain, constipation, diarrhea, nausea and vomiting.  Genitourinary: Negative.   Musculoskeletal: Negative.  Negative for arthralgias, myalgias and neck pain.  Skin: Negative.   Neurological: Negative.  Negative for dizziness, weakness  and headaches.  Hematological:  Negative for adenopathy. Does not bruise/bleed easily.  Psychiatric/Behavioral: Negative.      Objective:  BP (!) 140/76 (BP Location: Right Arm, Patient Position: Sitting, Cuff Size: Large) Comment: BP (R) 140/76 (L) 142/78  Pulse 88   Temp 98 F (36.7 C) (Oral)   Resp 16   Ht '5\' 6"'$  (1.676 m)   Wt 199 lb (90.3 kg)   SpO2 95%   BMI 32.12 kg/m   BP Readings from Last 3 Encounters:  06/13/22 (!) 140/76  12/12/21 137/66  11/01/21 136/78    Wt Readings from Last 3 Encounters:  06/13/22 199 lb (90.3 kg)  12/12/21 205 lb (93 kg)  11/28/21 205 lb (93 kg)    Physical Exam Vitals reviewed.  HENT:     Mouth/Throat:     Mouth: Mucous membranes are moist.  Eyes:     General: No scleral icterus.    Conjunctiva/sclera: Conjunctivae normal.  Cardiovascular:     Rate and Rhythm: Normal rate and regular rhythm.     Heart sounds: Normal heart sounds, S1 normal and S2 normal. No murmur heard.    Comments: EKG- NSR, 89 bpm ?LAE No LVH, Q waves, or ST/T wave changes Pulmonary:     Effort: Pulmonary effort is normal.     Breath sounds: No stridor. No wheezing, rhonchi or rales.  Abdominal:  General: Abdomen is flat.     Palpations: There is no mass.     Tenderness: There is no abdominal tenderness. There is no guarding.     Hernia: No hernia is present. There is no hernia in the left inguinal area or right inguinal area.  Genitourinary:    Pubic Area: No rash.      Penis: Normal and circumcised.      Testes: Normal.     Epididymis:     Right: Normal.     Left: Normal.     Prostate: Enlarged. Not tender and no nodules present.     Rectum: Normal. Guaiac result negative. No mass, tenderness, anal fissure, external hemorrhoid or internal hemorrhoid. Normal anal tone.  Musculoskeletal:     Right lower leg: No edema.     Left lower leg: No edema.  Lymphadenopathy:     Lower Body: No right inguinal adenopathy. No left inguinal adenopathy.   Skin:    General: Skin is warm and dry.  Neurological:     General: No focal deficit present.     Mental Status: He is alert.  Psychiatric:        Mood and Affect: Mood normal.        Behavior: Behavior normal.     Lab Results  Component Value Date   WBC 8.4 06/13/2022   HGB 15.5 06/13/2022   HCT 45.2 06/13/2022   PLT 196.0 06/13/2022   GLUCOSE 101 (H) 06/13/2022   CHOL 186 06/13/2022   TRIG 182.0 (H) 06/13/2022   HDL 59.60 06/13/2022   LDLDIRECT 113.0 10/15/2018   LDLCALC 90 06/13/2022   ALT 30 06/13/2022   AST 23 06/13/2022   NA 138 06/13/2022   K 3.7 06/13/2022   CL 98 06/13/2022   CREATININE 0.99 06/13/2022   BUN 16 06/13/2022   CO2 28 06/13/2022   TSH 0.98 02/08/2021   PSA 0.81 06/13/2022   HGBA1C 6.0 06/13/2022    DG Hand Complete Left  Result Date: 06/14/2020 CLINICAL DATA:  Staple in hand due to misfiring of staple gun EXAM: LEFT HAND - COMPLETE 3+ VIEW COMPARISON:  None. FINDINGS: Frontal, oblique, and lateral views were obtained. A metallic staple is noted volar to the proximal aspect of the fifth proximal phalanx. The inferior aspect of the staple appears to abut but not directly invade the volar aspect of the proximal portion of the fifth proximal phalanx. No other radiopaque foreign body evident. No dislocation. There is narrowing of the radiocarpal joint as well as narrowing of the scaphotrapezial joint. There is mild osteoarthritic change in third and fourth DIP joints. A benign cystic areas noted in the distal ulna laterally. There is calcification in the triangular fibrocartilage region which may represent residua of old trauma. There is a positive ulnar variance. IMPRESSION: Staple in volar aspect of hand at the level of the fifth proximal phalanx. The inferior aspect of the staple appears to abut but not frankly invade the bone in this area. No other radiopaque foreign body. No fracture or dislocation evident. 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  Positive ulnar variance evident. These results will be called to the ordering clinician or representative by the Radiologist Assistant, and communication documented in the PACS or [employer]. Electronically Signed   By: Lowella Grip III M.D.   On: 06/14/2020 15:27    Assessment & Plan:   Steven Hickman was seen today for annual exam, hypertension and hyperlipidemia.  Diagnoses and all orders for this visit:  Essential hypertension- He has not achieved his blood pressure goal of 130/80.  He would like to work on his lifestyle modifications rather than add another medication. -     Basic metabolic panel; Future -     CBC with Differential/Platelet; Future -     Hepatic function panel; Future -     EKG 12-Lead -     Urinalysis, Routine w reflex microscopic; Future -     Urinalysis, Routine w reflex microscopic -     Hepatic function panel -     CBC with Differential/Platelet -     Basic metabolic panel -     Discontinue: potassium chloride (KLOR-CON) 10 MEQ tablet; Take 1 tablet (10 mEq total) by mouth 3 (three) times daily. -     potassium chloride (KLOR-CON) 10 MEQ tablet; Take 1 tablet (10 mEq total) by mouth 3 (three) times daily.  BPH with obstruction/lower urinary tract symptoms- His PSA is normal. -     Urinalysis, Routine w reflex microscopic; Future -     Urinalysis, Routine w reflex microscopic  Dyslipidemia, goal LDL below 100- LDL goal achieved. Doing well on the statin  -     Lipid panel; Future -     Hepatic function panel; Future -     Hepatic function panel -     Lipid panel -     pravastatin (PRAVACHOL) 20 MG tablet; Take 1 tablet (20 mg total) by mouth daily.  Prediabetes- His A1c is 6.0%. -     Hemoglobin A1c; Future -     Hemoglobin A1c  Routine general medical examination at a health care facility- Exam completed, labs reviewed, vaccines reviewed and updated, cancer screenings are up-to-date, patient education was given. -     PSA; Future -      PSA  Diuretic-induced hypokalemia -     Discontinue: potassium chloride (KLOR-CON) 10 MEQ tablet; Take 1 tablet (10 mEq total) by mouth 3 (three) times daily. -     potassium chloride (KLOR-CON) 10 MEQ tablet; Take 1 tablet (10 mEq total) by mouth 3 (three) times daily.  Floaters, bilateral -     Ambulatory referral to Ophthalmology   I am having Steven Hickman "[name]" maintain his doxazosin, aspirin EC, OMEPRAZOLE PO, Probiotic Product (PROBIOTIC PO), Fluticasone Propionate (FLONASE NA), GARLIC PO, multivitamin, FOLIC ACID PO, Vitamin D3, Edarbyclor, pravastatin, and potassium chloride.  Meds ordered this encounter  Medications   pravastatin (PRAVACHOL) 20 MG tablet    Sig: Take 1 tablet (20 mg total) by mouth daily.    Dispense:  90 tablet    Refill:  1   DISCONTD: potassium chloride (KLOR-CON) 10 MEQ tablet    Sig: Take 1 tablet (10 mEq total) by mouth 3 (three) times daily.    Dispense:  270 tablet    Refill:  0   potassium chloride (KLOR-CON) 10 MEQ tablet    Sig: Take 1 tablet (10 mEq total) by mouth 3 (three) times daily.    Dispense:  270 tablet    Refill:  1     Follow-up: Return in about 6 months (around 12/12/2022).  Scarlette Calico, MD

## 2022-06-13 NOTE — Patient Instructions (Signed)
Health Maintenance, Male Adopting a healthy lifestyle and getting preventive care are important in promoting health and wellness. Ask your health care provider about: The right schedule for you to have regular tests and exams. Things you can do on your own to prevent diseases and keep yourself healthy. What should I know about diet, weight, and exercise? Eat a healthy diet  Eat a diet that includes plenty of vegetables, fruits, low-fat dairy products, and lean protein. Do not eat a lot of foods that are high in solid fats, added sugars, or sodium. Maintain a healthy weight Body mass index (BMI) is a measurement that can be used to identify possible weight problems. It estimates body fat based on height and weight. Your health care provider can help determine your BMI and help you achieve or maintain a healthy weight. Get regular exercise Get regular exercise. This is one of the most important things you can do for your health. Most adults should: Exercise for at least 150 minutes each week. The exercise should increase your heart rate and make you sweat (moderate-intensity exercise). Do strengthening exercises at least twice a week. This is in addition to the moderate-intensity exercise. Spend less time sitting. Even light physical activity can be beneficial. Watch cholesterol and blood lipids Have your blood tested for lipids and cholesterol at 64 years of age, then have this test every 5 years. You may need to have your cholesterol levels checked more often if: Your lipid or cholesterol levels are high. You are older than 64 years of age. You are at high risk for heart disease. What should I know about cancer screening? Many types of cancers can be detected early and may often be prevented. Depending on your health history and family history, you may need to have cancer screening at various ages. This may include screening for: Colorectal cancer. Prostate cancer. Skin cancer. Lung  cancer. What should I know about heart disease, diabetes, and high blood pressure? Blood pressure and heart disease High blood pressure causes heart disease and increases the risk of stroke. This is more likely to develop in people who have high blood pressure readings or are overweight. Talk with your health care provider about your target blood pressure readings. Have your blood pressure checked: Every 3-5 years if you are 18-39 years of age. Every year if you are 40 years old or older. If you are between the ages of 65 and 75 and are a current or former smoker, ask your health care provider if you should have a one-time screening for abdominal aortic aneurysm (AAA). Diabetes Have regular diabetes screenings. This checks your fasting blood sugar level. Have the screening done: Once every three years after age 45 if you are at a normal weight and have a low risk for diabetes. More often and at a younger age if you are overweight or have a high risk for diabetes. What should I know about preventing infection? Hepatitis B If you have a higher risk for hepatitis B, you should be screened for this virus. Talk with your health care provider to find out if you are at risk for hepatitis B infection. Hepatitis C Blood testing is recommended for: Everyone born from 1945 through 1965. Anyone with known risk factors for hepatitis C. Sexually transmitted infections (STIs) You should be screened each year for STIs, including gonorrhea and chlamydia, if: You are sexually active and are younger than 64 years of age. You are older than 64 years of age and your   health care provider tells you that you are at risk for this type of infection. Your sexual activity has changed since you were last screened, and you are at increased risk for chlamydia or gonorrhea. Ask your health care provider if you are at risk. Ask your health care provider about whether you are at high risk for HIV. Your health care provider  may recommend a prescription medicine to help prevent HIV infection. If you choose to take medicine to prevent HIV, you should first get tested for HIV. You should then be tested every 3 months for as long as you are taking the medicine. Follow these instructions at home: Alcohol use Do not drink alcohol if your health care provider tells you not to drink. If you drink alcohol: Limit how much you have to 0-2 drinks a day. Know how much alcohol is in your drink. In the [place] one drink equals one 12 oz bottle of beer (355 mL), one 5 oz glass of wine (148 mL), or one 1 oz glass of hard liquor (44 mL). Lifestyle Do not use any products that contain nicotine or tobacco. These products include cigarettes, chewing tobacco, and vaping devices, such as e-cigarettes. If you need help quitting, ask your health care provider. Do not use street drugs. Do not share needles. Ask your health care provider for help if you need support or information about quitting drugs. General instructions Schedule regular health, dental, and eye exams. Stay current with your vaccines. Tell your health care provider if: You often feel depressed. You have ever been abused or do not feel safe at home. Summary Adopting a healthy lifestyle and getting preventive care are important in promoting health and wellness. Follow your health care provider's instructions about healthy diet, exercising, and getting tested or screened for diseases. Follow your health care provider's instructions on monitoring your cholesterol and blood pressure. This information is not intended to replace advice given to you by your health care provider. Make sure you discuss any questions you have with your health care provider. Document Revised: 02/06/2021 Document Reviewed: 02/06/2021 Elsevier Patient Education  2023 [employer] Inc[employer]  

## 2022-06-15 ENCOUNTER — Other Ambulatory Visit: Payer: Self-pay | Admitting: Internal Medicine

## 2022-06-15 DIAGNOSIS — T502X5A Adverse effect of carbonic-anhydrase inhibitors, benzothiadiazides and other diuretics, initial encounter: Secondary | ICD-10-CM

## 2022-06-15 DIAGNOSIS — I10 Essential (primary) hypertension: Secondary | ICD-10-CM

## 2022-06-16 ENCOUNTER — Other Ambulatory Visit: Payer: Self-pay | Admitting: Internal Medicine

## 2022-06-16 DIAGNOSIS — T502X5A Adverse effect of carbonic-anhydrase inhibitors, benzothiadiazides and other diuretics, initial encounter: Secondary | ICD-10-CM

## 2022-06-16 DIAGNOSIS — I10 Essential (primary) hypertension: Secondary | ICD-10-CM

## 2022-06-16 MED ORDER — POTASSIUM CHLORIDE ER 10 MEQ PO TBCR: 10.0000 meq | EXTENDED_RELEASE_TABLET | Freq: Three times a day (TID) | ORAL | 0 refills | Status: DC

## 2022-06-17 DIAGNOSIS — H43393 Other vitreous opacities, bilateral: Secondary | ICD-10-CM | POA: Insufficient documentation

## 2022-06-17 MED ORDER — POTASSIUM CHLORIDE ER 10 MEQ PO TBCR: 10.0000 meq | EXTENDED_RELEASE_TABLET | Freq: Three times a day (TID) | ORAL | 1 refills | Status: DC

## 2022-06-18 MED ORDER — POTASSIUM CHLORIDE ER 10 MEQ PO TBCR: 10.0000 meq | EXTENDED_RELEASE_TABLET | Freq: Three times a day (TID) | ORAL | 0 refills | Status: DC

## 2022-07-15 IMAGING — DX DG HAND COMPLETE 3+V*L*
3 series · 3 of 3 positions shown · non-contrast
Comparison: None.

CLINICAL DATA: Staple in hand due to misfiring of staple gun

EXAM:
LEFT HAND - COMPLETE 3+ VIEW

[hand pa]
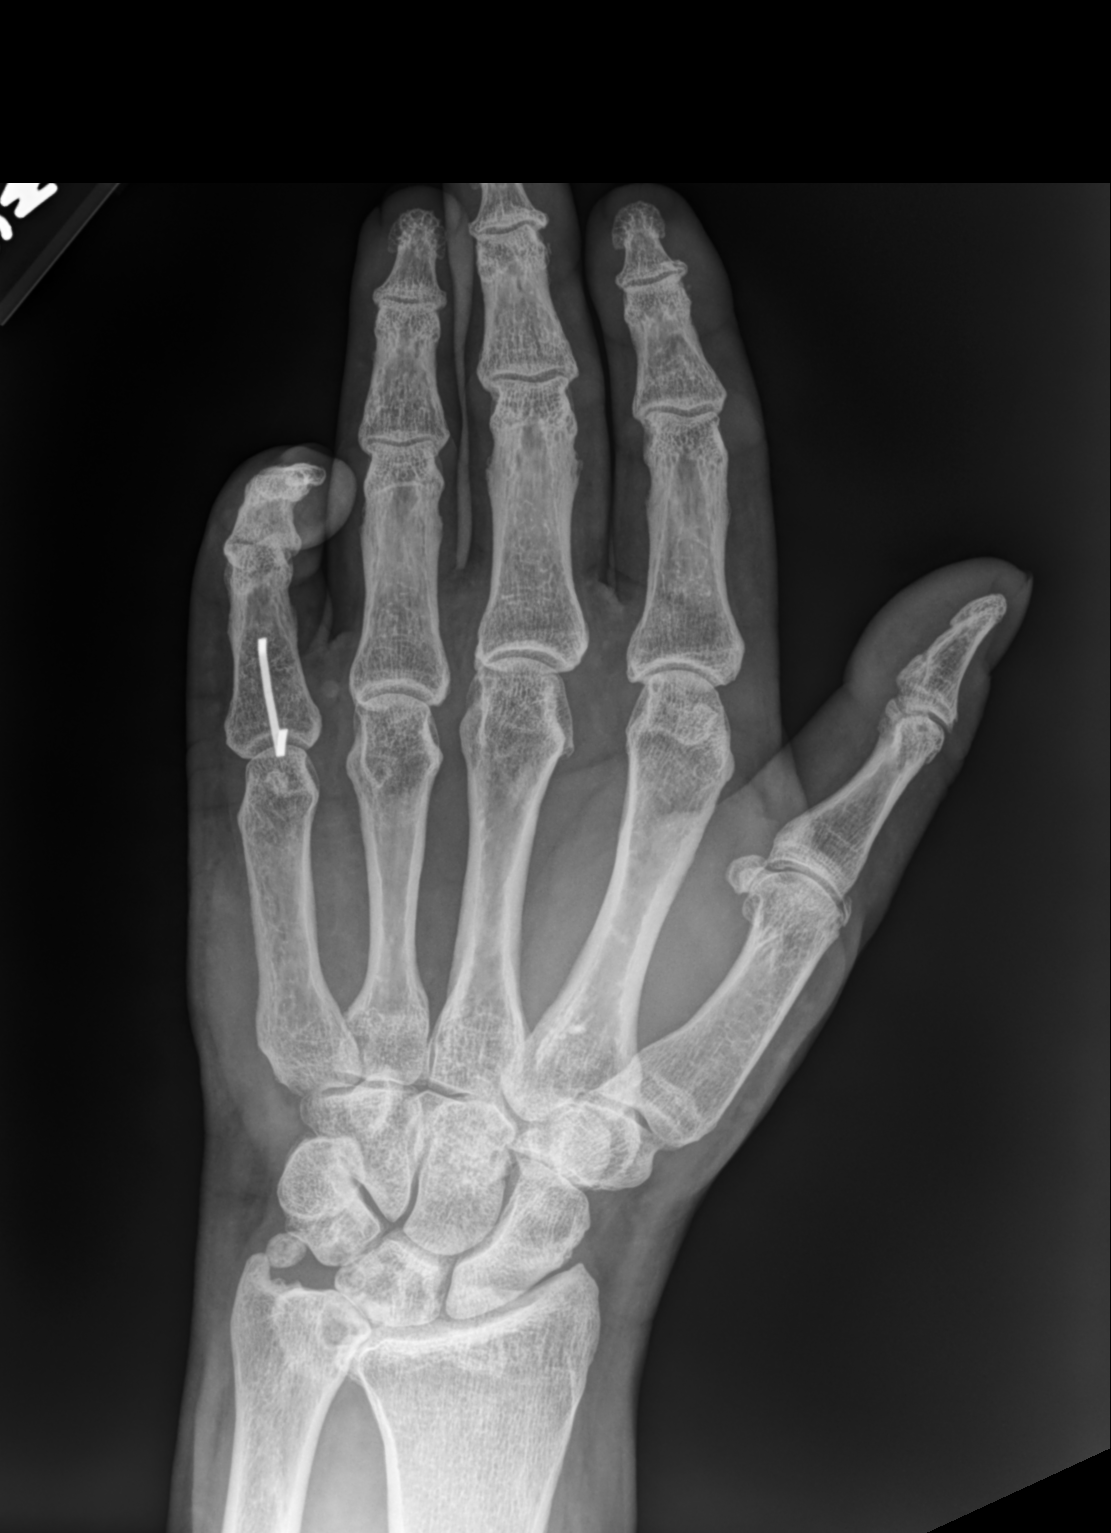

[hand mlo]
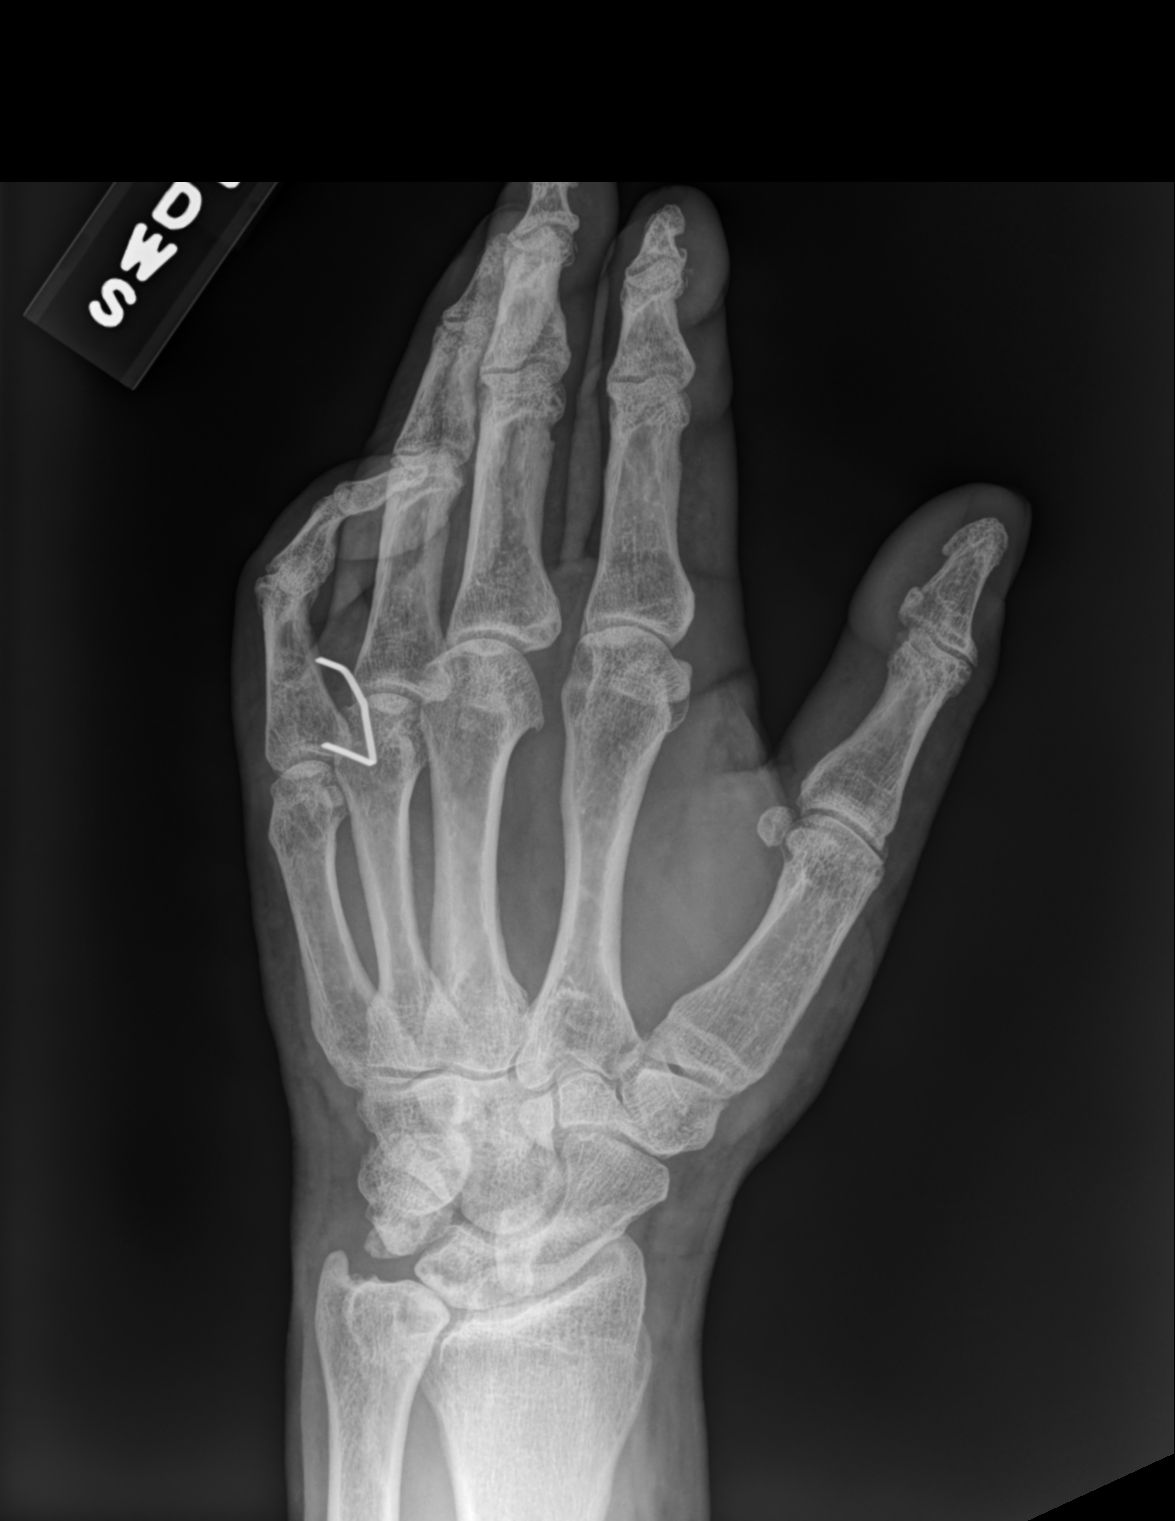

[hand lat]
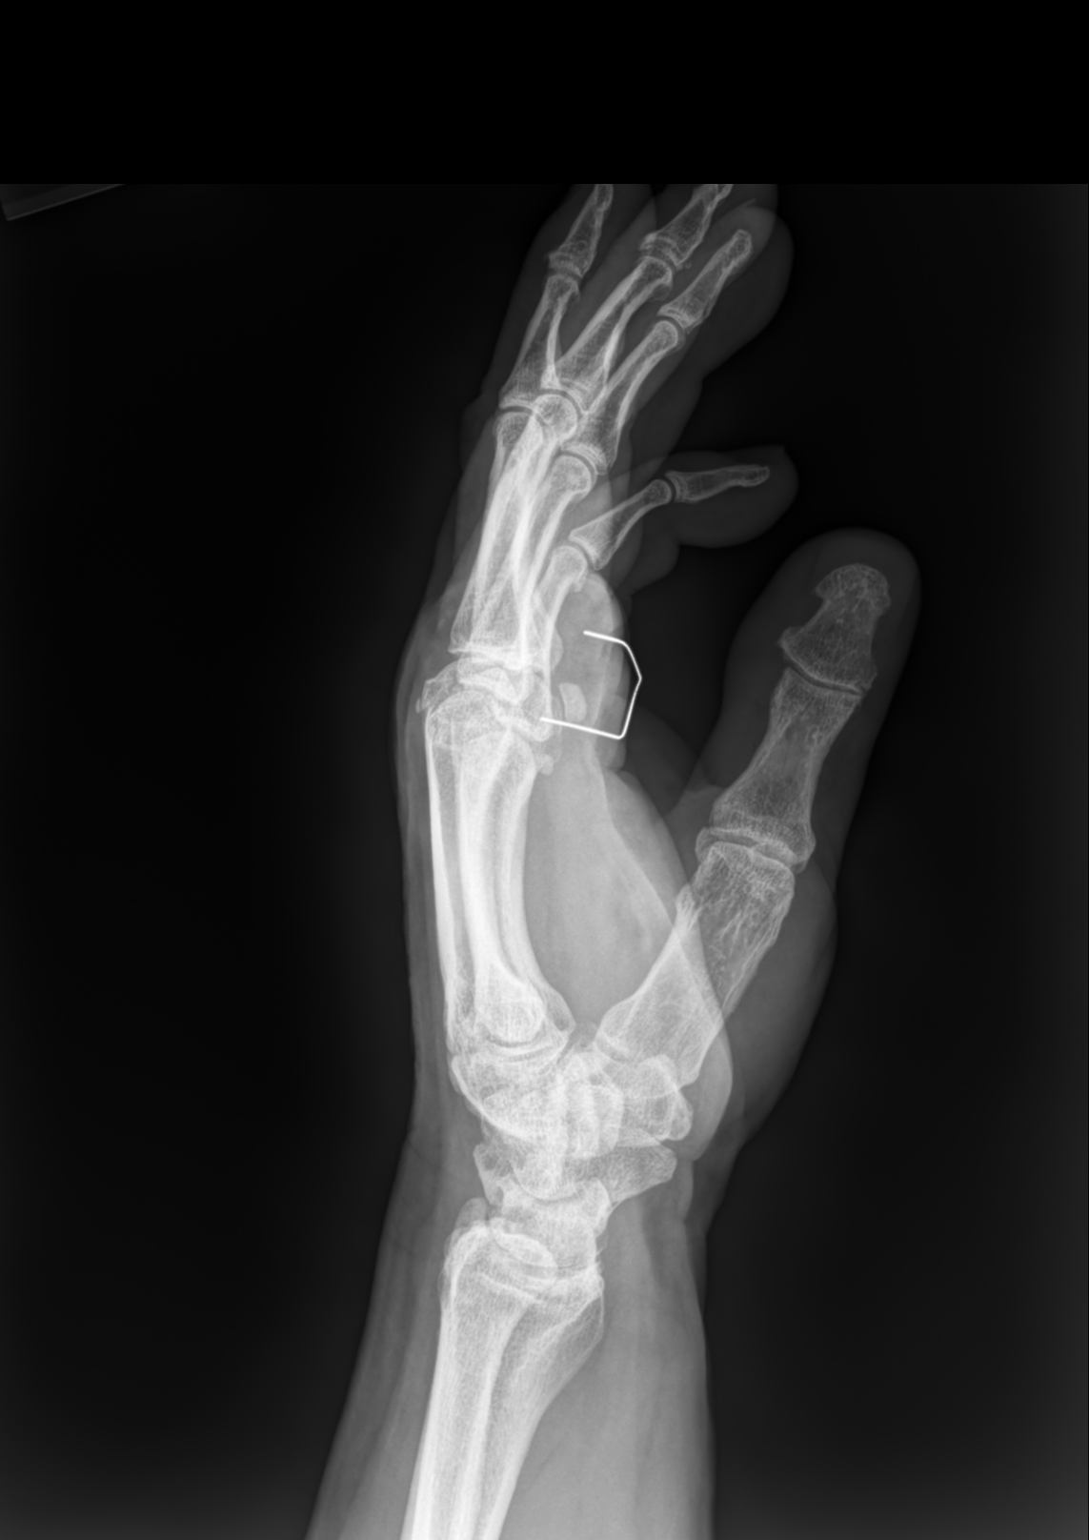

[3 of 3 positions shown; findings below may reference images not displayed]

FINDINGS: Frontal, oblique, and lateral views were obtained. A metallic staple
is noted volar to the proximal aspect of the fifth proximal phalanx.
The inferior aspect of the staple appears to abut but not directly
invade the volar aspect of the proximal portion of the fifth
proximal phalanx. No other radiopaque foreign body evident. No
dislocation. There is narrowing of the radiocarpal joint as well as
narrowing of the scaphotrapezial joint. There is mild osteoarthritic
change in third and fourth DIP joints. A benign cystic areas noted
in the distal ulna laterally. There is calcification in the
triangular fibrocartilage region which may represent residua of old
trauma. There is a positive ulnar variance.
IMPRESSION: Staple in volar aspect of hand at the level of the fifth proximal
phalanx. The inferior aspect of the staple appears to abut but not
frankly invade the bone in this area. No other radiopaque foreign
body. No fracture or dislocation evident. Osteoarthritic change at
several sites noted. Positive ulnar variance evident.

These results will be called to the ordering clinician or
representative by the Radiologist Assistant, and communication
documented in the PACS or [REDACTED].

## 2022-08-13 ENCOUNTER — Other Ambulatory Visit: Payer: Self-pay | Admitting: Internal Medicine

## 2022-08-13 DIAGNOSIS — E559 Vitamin D deficiency, unspecified: Secondary | ICD-10-CM

## 2022-08-13 MED ORDER — VITAMIN D3 1.25 MG (50000 UT) PO CAPS: 1.0000 | ORAL_CAPSULE | Freq: Every day | ORAL | 0 refills | Status: DC

## 2022-11-05 ENCOUNTER — Other Ambulatory Visit: Payer: Self-pay | Admitting: Internal Medicine

## 2022-11-05 DIAGNOSIS — E559 Vitamin D deficiency, unspecified: Secondary | ICD-10-CM

## 2022-11-05 MED ORDER — VITAMIN D3 1.25 MG (50000 UT) PO CAPS: 1.2500 mg | ORAL_CAPSULE | ORAL | 0 refills | Status: DC

## 2022-11-20 ENCOUNTER — Other Ambulatory Visit: Payer: Self-pay | Admitting: Internal Medicine

## 2022-11-20 DIAGNOSIS — I10 Essential (primary) hypertension: Secondary | ICD-10-CM

## 2022-11-27 ENCOUNTER — Other Ambulatory Visit: Payer: Self-pay | Admitting: Internal Medicine

## 2022-11-27 DIAGNOSIS — I10 Essential (primary) hypertension: Secondary | ICD-10-CM

## 2022-12-08 ENCOUNTER — Other Ambulatory Visit: Payer: Self-pay | Admitting: Internal Medicine

## 2022-12-08 DIAGNOSIS — E785 Hyperlipidemia, unspecified: Secondary | ICD-10-CM

## 2023-01-07 DIAGNOSIS — K080 Exfoliation of teeth due to systemic causes: Secondary | ICD-10-CM | POA: Diagnosis not present

## 2023-01-25 ENCOUNTER — Other Ambulatory Visit: Payer: Self-pay | Admitting: Internal Medicine

## 2023-01-25 DIAGNOSIS — E559 Vitamin D deficiency, unspecified: Secondary | ICD-10-CM

## 2023-01-28 ENCOUNTER — Other Ambulatory Visit: Payer: Self-pay | Admitting: Internal Medicine

## 2023-01-28 DIAGNOSIS — K080 Exfoliation of teeth due to systemic causes: Secondary | ICD-10-CM | POA: Diagnosis not present

## 2023-01-28 DIAGNOSIS — E559 Vitamin D deficiency, unspecified: Secondary | ICD-10-CM

## 2023-01-30 ENCOUNTER — Other Ambulatory Visit: Payer: Self-pay | Admitting: Internal Medicine

## 2023-01-30 DIAGNOSIS — E559 Vitamin D deficiency, unspecified: Secondary | ICD-10-CM

## 2023-01-30 MED ORDER — VITAMIN D3 1.25 MG (50000 UT) PO CAPS: 1.0000 | ORAL_CAPSULE | ORAL | 0 refills | Status: DC

## 2023-02-11 ENCOUNTER — Encounter: Payer: Self-pay | Admitting: Internal Medicine

## 2023-02-11 ENCOUNTER — Ambulatory Visit (INDEPENDENT_AMBULATORY_CARE_PROVIDER_SITE_OTHER): Payer: Medicare Other | Admitting: Internal Medicine

## 2023-02-11 VITALS — BP 142/82 | HR 78 | Temp 98.2°F | Ht 66.0 in | Wt 204.0 lb

## 2023-02-11 DIAGNOSIS — T502X5A Adverse effect of carbonic-anhydrase inhibitors, benzothiadiazides and other diuretics, initial encounter: Secondary | ICD-10-CM

## 2023-02-11 DIAGNOSIS — E876 Hypokalemia: Secondary | ICD-10-CM | POA: Diagnosis not present

## 2023-02-11 DIAGNOSIS — Z23 Encounter for immunization: Secondary | ICD-10-CM | POA: Diagnosis not present

## 2023-02-11 DIAGNOSIS — N401 Enlarged prostate with lower urinary tract symptoms: Secondary | ICD-10-CM

## 2023-02-11 DIAGNOSIS — I10 Essential (primary) hypertension: Secondary | ICD-10-CM

## 2023-02-11 DIAGNOSIS — N138 Other obstructive and reflux uropathy: Secondary | ICD-10-CM

## 2023-02-11 DIAGNOSIS — R7303 Prediabetes: Secondary | ICD-10-CM

## 2023-02-11 DIAGNOSIS — K080 Exfoliation of teeth due to systemic causes: Secondary | ICD-10-CM | POA: Diagnosis not present

## 2023-02-11 DIAGNOSIS — E559 Vitamin D deficiency, unspecified: Secondary | ICD-10-CM

## 2023-02-11 LAB — BASIC METABOLIC PANEL
BUN: 13 mg/dL (ref 6–23)
CO2: 28 mEq/L (ref 19–32)
Calcium: 10.1 mg/dL (ref 8.4–10.5)
Chloride: 99 mEq/L (ref 96–112)
Creatinine, Ser: 0.97 mg/dL (ref 0.40–1.50)
GFR: 82.13 mL/min (ref >60.00)
Glucose, Bld: 114 mg/dL — ABNORMAL HIGH (ref 70–99)
Potassium: 4.4 mEq/L (ref 3.5–5.1)
Sodium: 138 mEq/L (ref 135–145)

## 2023-02-11 LAB — VITAMIN D 25 HYDROXY (VIT D DEFICIENCY, FRACTURES): VITD: 54.82 ng/mL (ref 30.00–100.00)

## 2023-02-11 LAB — HEMOGLOBIN A1C: Hgb A1c MFr Bld: 6 % (ref 4.6–6.5)

## 2023-02-11 NOTE — Progress Notes (Unsigned)
Subjective:  Patient ID: Kevron Naccarato, male    DOB: 1956/02/01  Age: 67 y.o. MRN: [id number]  CC: No chief complaint on file.   HPI Essam Parkman presents for ***  Outpatient Medications Prior to Visit  Medication Sig Dispense Refill   aspirin EC 81 MG tablet Take 81 mg by mouth daily. Swallow whole.     Cholecalciferol (D3-50) 1.25 MG (50000 UT) capsule TAKE 1 CAPSULE BY MOUTH ONE TIME PER WEEK 12 capsule 0   doxazosin (CARDURA) 2 MG tablet Take 1 tablet (2 mg total) by mouth daily. 90 tablet 1   EDARBYCLOR 40-12.5 MG TABS TAKE 1 TABLET BY MOUTH ONCE DAILY 90 tablet 0   Fluticasone Propionate (FLONASE NA) Place into the nose.     FOLIC ACID PO Take by mouth.     GARLIC PO Take by mouth.     Multiple Vitamin (MULTIVITAMIN) capsule Take 1 capsule by mouth daily.     OMEPRAZOLE PO Take by mouth.     potassium chloride (KLOR-CON) 10 MEQ tablet Take 1 tablet (10 mEq total) by mouth 3 (three) times daily. 270 tablet 0   potassium chloride (KLOR-CON) 10 MEQ tablet Take 1 tablet (10 mEq total) by mouth 3 (three) times daily. 270 tablet 1   pravastatin (PRAVACHOL) 20 MG tablet TAKE 1 TABLET BY MOUTH EVERY DAY 90 tablet 1   Probiotic Product (PROBIOTIC PO) Take by mouth.     No facility-administered medications prior to visit.    ROS Review of Systems  Objective:  BP (!) 142/82 (BP Location: Left Arm, Patient Position: Sitting, Cuff Size: Large)   Pulse 78   Temp 98.2 F (36.8 C) (Oral)   Ht 5\' 6"  (1.676 m)   Wt 204 lb (92.5 kg)   SpO2 97%   BMI 32.93 kg/m   BP Readings from Last 3 Encounters:  02/11/23 (!) 142/82  06/13/22 (!) 140/76  12/12/21 137/66    Wt Readings from Last 3 Encounters:  02/11/23 204 lb (92.5 kg)  06/13/22 199 lb (90.3 kg)  12/12/21 205 lb (93 kg)    Physical Exam  Lab Results  Component Value Date   WBC 8.4 06/13/2022   HGB 15.5 06/13/2022   HCT 45.2 06/13/2022   PLT 196.0 06/13/2022   GLUCOSE 114 (H) 02/11/2023   CHOL 186 06/13/2022   TRIG  182.0 (H) 06/13/2022   HDL 59.60 06/13/2022   LDLDIRECT 113.0 10/15/2018   LDLCALC 90 06/13/2022   ALT 30 06/13/2022   AST 23 06/13/2022   NA 138 02/11/2023   K 4.4 02/11/2023   CL 99 02/11/2023   CREATININE 0.97 02/11/2023   BUN 13 02/11/2023   CO2 28 02/11/2023   TSH 0.98 02/08/2021   PSA 0.81 06/13/2022   HGBA1C 6.0 02/11/2023    DG Hand Complete Left  Result Date: 06/14/2020 CLINICAL DATA:  Staple in hand due to misfiring of staple gun EXAM: LEFT HAND - COMPLETE 3+ VIEW COMPARISON:  None. FINDINGS: Frontal, oblique, and lateral views were obtained. A metallic staple is noted volar to the proximal aspect of the fifth proximal phalanx. The inferior aspect of the staple appears to abut but not directly invade the volar aspect of the proximal portion of the fifth proximal phalanx. No other radiopaque foreign body evident. No dislocation. There is narrowing of the radiocarpal joint as well as narrowing of the scaphotrapezial joint. There is mild osteoarthritic change in third and fourth DIP joints. A benign cystic areas noted in the  distal ulna laterally. There is calcification in the triangular fibrocartilage region which may represent residua of old trauma. There is a positive ulnar variance. IMPRESSION: Staple in volar aspect of hand at the level of the fifth proximal phalanx. The inferior aspect of the staple appears to abut but not frankly invade the bone in this area. No other radiopaque foreign body. No fracture or dislocation evident. 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 Positive ulnar variance evident. These results will be called to the ordering clinician or representative by the Radiologist Assistant, and communication documented in the PACS or [employer]. Electronically Signed   By: Bretta Bang III M.D.   On: 06/14/2020 15:27    Assessment & Plan:  Essential hypertension -     Basic metabolic panel; Future  Diuretic-induced hypokalemia -     Basic metabolic  panel; Future  Prediabetes -     Hemoglobin A1c; Future -     Basic metabolic panel; Future  Vitamin D deficiency disease -     VITAMIN D 25 Hydroxy (Vit-D Deficiency, Fractures); Future  Need for vaccination -     Pneumococcal conjugate vaccine 20-valent     Follow-up: Return in about 6 months (around 08/14/2023).  Sanda Linger, MD

## 2023-02-11 NOTE — Patient Instructions (Signed)
Hypertension, Adult High blood pressure (hypertension) is when the force of blood pumping through the arteries is too strong. The arteries are the blood vessels that carry blood from the heart throughout the body. Hypertension forces the heart to work harder to pump blood and may cause arteries to become narrow or stiff. Untreated or uncontrolled hypertension can lead to a heart attack, heart failure, a stroke, kidney disease, and other problems. A blood pressure reading consists of a higher number over a lower number. Ideally, your blood pressure should be below 120/80. The first ("top") number is called the systolic pressure. It is a measure of the pressure in your arteries as your heart beats. The second ("bottom") number is called the diastolic pressure. It is a measure of the pressure in your arteries as the heart relaxes. What are the causes? The exact cause of this condition is not known. There are some conditions that result in high blood pressure. What increases the risk? Certain factors may make you more likely to develop high blood pressure. Some of these risk factors are under your control, including: Smoking. Not getting enough exercise or physical activity. Being overweight. Having too much fat, sugar, calories, or salt (sodium) in your diet. Drinking too much alcohol. Other risk factors include: Having a personal history of heart disease, diabetes, high cholesterol, or kidney disease. Stress. Having a family history of high blood pressure and high cholesterol. Having obstructive sleep apnea. Age. The risk increases with age. What are the signs or symptoms? High blood pressure may not cause symptoms. Very high blood pressure (hypertensive crisis) may cause: Headache. Fast or irregular heartbeats (palpitations). Shortness of breath. Nosebleed. Nausea and vomiting. Vision changes. Severe chest pain, dizziness, and seizures. How is this diagnosed? This condition is diagnosed by  measuring your blood pressure while you are seated, with your arm resting on a flat surface, your legs uncrossed, and your feet flat on the floor. The cuff of the blood pressure monitor will be placed directly against the skin of your upper arm at the level of your heart. Blood pressure should be measured at least twice using the same arm. Certain conditions can cause a difference in blood pressure between your right and left arms. If you have a high blood pressure reading during one visit or you have normal blood pressure with other risk factors, you may be asked to: Return on a different day to have your blood pressure checked again. Monitor your blood pressure at home for 1 week or longer. If you are diagnosed with hypertension, you may have other blood or imaging tests to help your health care provider understand your overall risk for other conditions. How is this treated? This condition is treated by making healthy lifestyle changes, such as eating healthy foods, exercising more, and reducing your alcohol intake. You may be referred for counseling on a healthy diet and physical activity. Your health care provider may prescribe medicine if lifestyle changes are not enough to get your blood pressure under control and if: Your systolic blood pressure is above 130. Your diastolic blood pressure is above 80. Your personal target blood pressure may vary depending on your medical conditions, your age, and other factors. Follow these instructions at home: Eating and drinking  Eat a diet that is high in fiber and potassium, and low in sodium, added sugar, and fat. An example of this eating plan is called the DASH diet. DASH stands for Dietary Approaches to Stop Hypertension. To eat this way: Eat   plenty of fresh fruits and vegetables. Try to fill one half of your plate at each meal with fruits and vegetables. Eat whole grains, such as whole-wheat pasta, brown rice, or whole-grain bread. Fill about one  fourth of your plate with whole grains. Eat or drink low-fat dairy products, such as skim milk or low-fat yogurt. Avoid fatty cuts of meat, processed or cured meats, and poultry with skin. Fill about one fourth of your plate with lean proteins, such as fish, chicken without skin, beans, eggs, or tofu. Avoid pre-made and processed foods. These tend to be higher in sodium, added sugar, and fat. Reduce your daily sodium intake. Many people with hypertension should eat less than 1,500 mg of sodium a day. Do not drink alcohol if: Your health care provider tells you not to drink. You are pregnant, may be pregnant, or are planning to become pregnant. If you drink alcohol: Limit how much you have to: 0-1 drink a day for women. 0-2 drinks a day for men. Know how much alcohol is in your drink. In the [place] one drink equals one 12 oz bottle of beer (355 mL), one 5 oz glass of wine (148 mL), or one 1 oz glass of hard liquor (44 mL). Lifestyle  Work with your health care provider to maintain a healthy body weight or to lose weight. Ask what an ideal weight is for you. Get at least 30 minutes of exercise that causes your heart to beat faster (aerobic exercise) most days of the week. Activities may include walking, swimming, or biking. Include exercise to strengthen your muscles (resistance exercise), such as Pilates or lifting weights, as part of your weekly exercise routine. Try to do these types of exercises for 30 minutes at least 3 days a week. Do not use any products that contain nicotine or tobacco. These products include cigarettes, chewing tobacco, and vaping devices, such as e-cigarettes. If you need help quitting, ask your health care provider. Monitor your blood pressure at home as told by your health care provider. Keep all follow-up visits. This is important. Medicines Take over-the-counter and prescription medicines only as told by your health care provider. Follow directions carefully. Blood  pressure medicines must be taken as prescribed. Do not skip doses of blood pressure medicine. Doing this puts you at risk for problems and can make the medicine less effective. Ask your health care provider about side effects or reactions to medicines that you should watch for. Contact a health care provider if you: Think you are having a reaction to a medicine you are taking. Have headaches that keep coming back (recurring). Feel dizzy. Have swelling in your ankles. Have trouble with your vision. Get help right away if you: Develop a severe headache or confusion. Have unusual weakness or numbness. Feel faint. Have severe pain in your chest or abdomen. Vomit repeatedly. Have trouble breathing. These symptoms may be an emergency. Get help right away. Call 911. Do not wait to see if the symptoms will go away. Do not drive yourself to the hospital. Summary Hypertension is when the force of blood pumping through your arteries is too strong. If this condition is not controlled, it may put you at risk for serious complications. Your personal target blood pressure may vary depending on your medical conditions, your age, and other factors. For most people, a normal blood pressure is less than 120/80. Hypertension is treated with lifestyle changes, medicines, or a combination of both. Lifestyle changes include losing weight, eating a healthy,   low-sodium diet, exercising more, and limiting alcohol. This information is not intended to replace advice given to you by your health care provider. Make sure you discuss any questions you have with your health care provider. Document Revised: 07/25/2021 Document Reviewed: 07/25/2021 Elsevier Patient Education  2023 Elsevier Inc.  

## 2023-02-12 ENCOUNTER — Encounter: Payer: Self-pay | Admitting: Internal Medicine

## 2023-02-12 MED ORDER — DOXAZOSIN MESYLATE 2 MG PO TABS: 2.0000 mg | ORAL_TABLET | Freq: Every day | ORAL | 1 refills | Status: DC

## 2023-02-12 MED ORDER — CHOLECALCIFEROL 50 MCG (2000 UT) PO TABS
1.0000 | ORAL_TABLET | Freq: Every day | ORAL | 3 refills | Status: AC
Start: 2023-02-12 — End: ?

## 2023-02-19 ENCOUNTER — Other Ambulatory Visit: Payer: Self-pay | Admitting: Internal Medicine

## 2023-02-19 ENCOUNTER — Telehealth: Payer: Self-pay | Admitting: Internal Medicine

## 2023-02-19 DIAGNOSIS — T502X5A Adverse effect of carbonic-anhydrase inhibitors, benzothiadiazides and other diuretics, initial encounter: Secondary | ICD-10-CM

## 2023-02-19 DIAGNOSIS — I10 Essential (primary) hypertension: Secondary | ICD-10-CM

## 2023-02-19 MED ORDER — POTASSIUM CHLORIDE ER 10 MEQ PO TBCR
10.0000 meq | EXTENDED_RELEASE_TABLET | Freq: Three times a day (TID) | ORAL | 0 refills | Status: DC
Start: 2023-02-19 — End: 2023-02-19

## 2023-02-19 MED ORDER — POTASSIUM CHLORIDE ER 10 MEQ PO TBCR
10.0000 meq | EXTENDED_RELEASE_TABLET | Freq: Three times a day (TID) | ORAL | 0 refills | Status: DC
Start: 2023-02-19 — End: 2023-05-15

## 2023-02-19 NOTE — Telephone Encounter (Signed)
Patient called and states that his RX for potassium chloride (KLOR-CON) 10 MEQ tablet  was sent to the wrong pharmacy. RX was sent to [facility] mail order but he wants to pick it up at local pharmacy [id number] - [place] ([place]), [place] - [address]  Please send to correct pharmacy.  Call back number is [phone]

## 2023-02-19 NOTE — Telephone Encounter (Signed)
Rx has been corrected.   

## 2023-05-14 ENCOUNTER — Other Ambulatory Visit: Payer: Self-pay | Admitting: Internal Medicine

## 2023-05-14 DIAGNOSIS — I10 Essential (primary) hypertension: Secondary | ICD-10-CM

## 2023-05-15 ENCOUNTER — Other Ambulatory Visit: Payer: Self-pay | Admitting: Internal Medicine

## 2023-05-15 DIAGNOSIS — I10 Essential (primary) hypertension: Secondary | ICD-10-CM

## 2023-05-15 DIAGNOSIS — E876 Hypokalemia: Secondary | ICD-10-CM

## 2023-06-16 ENCOUNTER — Other Ambulatory Visit: Payer: Self-pay | Admitting: Internal Medicine

## 2023-06-16 DIAGNOSIS — E785 Hyperlipidemia, unspecified: Secondary | ICD-10-CM

## 2023-07-08 DIAGNOSIS — K080 Exfoliation of teeth due to systemic causes: Secondary | ICD-10-CM | POA: Diagnosis not present

## 2023-07-15 ENCOUNTER — Encounter: Payer: Medicare Other | Admitting: Internal Medicine

## 2023-07-15 DIAGNOSIS — K080 Exfoliation of teeth due to systemic causes: Secondary | ICD-10-CM | POA: Diagnosis not present

## 2023-08-04 ENCOUNTER — Other Ambulatory Visit: Payer: Self-pay | Admitting: Internal Medicine

## 2023-08-04 DIAGNOSIS — N138 Other obstructive and reflux uropathy: Secondary | ICD-10-CM

## 2023-08-04 DIAGNOSIS — I10 Essential (primary) hypertension: Secondary | ICD-10-CM

## 2023-08-12 ENCOUNTER — Other Ambulatory Visit: Payer: Self-pay | Admitting: Internal Medicine

## 2023-08-12 ENCOUNTER — Ambulatory Visit: Payer: Medicare Other | Admitting: Internal Medicine

## 2023-08-12 ENCOUNTER — Encounter: Payer: Self-pay | Admitting: Internal Medicine

## 2023-08-12 VITALS — BP 144/78 | HR 78 | Temp 98.1°F | Resp 16 | Ht 66.0 in | Wt 207.2 lb

## 2023-08-12 DIAGNOSIS — R7303 Prediabetes: Secondary | ICD-10-CM

## 2023-08-12 DIAGNOSIS — N138 Other obstructive and reflux uropathy: Secondary | ICD-10-CM

## 2023-08-12 DIAGNOSIS — K219 Gastro-esophageal reflux disease without esophagitis: Secondary | ICD-10-CM

## 2023-08-12 DIAGNOSIS — E876 Hypokalemia: Secondary | ICD-10-CM

## 2023-08-12 DIAGNOSIS — E785 Hyperlipidemia, unspecified: Secondary | ICD-10-CM

## 2023-08-12 DIAGNOSIS — Z0000 Encounter for general adult medical examination without abnormal findings: Secondary | ICD-10-CM | POA: Diagnosis not present

## 2023-08-12 DIAGNOSIS — Z0001 Encounter for general adult medical examination with abnormal findings: Secondary | ICD-10-CM

## 2023-08-12 DIAGNOSIS — I10 Essential (primary) hypertension: Secondary | ICD-10-CM

## 2023-08-12 DIAGNOSIS — N401 Enlarged prostate with lower urinary tract symptoms: Secondary | ICD-10-CM | POA: Diagnosis not present

## 2023-08-12 DIAGNOSIS — R0609 Other forms of dyspnea: Secondary | ICD-10-CM | POA: Diagnosis not present

## 2023-08-12 LAB — CBC WITH DIFFERENTIAL/PLATELET
Basophils Absolute: 0.1 10*3/uL (ref 0.0–0.1)
Basophils Relative: 0.8 % (ref 0.0–3.0)
Eosinophils Absolute: 0.1 10*3/uL (ref 0.0–0.7)
Eosinophils Relative: 0.7 % (ref 0.0–5.0)
HCT: 43.4 % (ref 39.0–52.0)
Hemoglobin: 14.7 g/dL (ref 13.0–17.0)
Lymphocytes Relative: 32.2 % (ref 12.0–46.0)
Lymphs Abs: 2.7 10*3/uL (ref 0.7–4.0)
MCHC: 33.9 g/dL (ref 30.0–36.0)
MCV: 91 fL (ref 78.0–100.0)
Monocytes Absolute: 0.8 10*3/uL (ref 0.1–1.0)
Monocytes Relative: 9.1 % (ref 3.0–12.0)
Neutro Abs: 4.8 10*3/uL (ref 1.4–7.7)
Neutrophils Relative %: 57.2 % (ref 43.0–77.0)
Platelets: 269 10*3/uL (ref 150.0–400.0)
RBC: 4.77 Mil/uL (ref 4.22–5.81)
RDW: 13.3 % (ref 11.5–15.5)
WBC: 8.4 10*3/uL (ref 4.0–10.5)

## 2023-08-12 LAB — LIPID PANEL
Cholesterol: 178 mg/dL (ref 0–200)
HDL: 59.8 mg/dL (ref >39.00)
LDL Cholesterol: 92 mg/dL (ref 0–99)
NonHDL: 117.77
Total CHOL/HDL Ratio: 3
Triglycerides: 127 mg/dL (ref 0.0–149.0)
VLDL: 25.4 mg/dL (ref 0.0–40.0)

## 2023-08-12 LAB — PSA: PSA: 0.96 ng/mL (ref 0.10–4.00)

## 2023-08-12 LAB — BASIC METABOLIC PANEL
BUN: 17 mg/dL (ref 6–23)
CO2: 31 meq/L (ref 19–32)
Calcium: 9.8 mg/dL (ref 8.4–10.5)
Chloride: 100 meq/L (ref 96–112)
Creatinine, Ser: 1.03 mg/dL (ref 0.40–1.50)
GFR: 76.16 mL/min (ref >60.00)
Glucose, Bld: 111 mg/dL — ABNORMAL HIGH (ref 70–99)
Potassium: 4.2 meq/L (ref 3.5–5.1)
Sodium: 142 meq/L (ref 135–145)

## 2023-08-12 LAB — URINALYSIS, ROUTINE W REFLEX MICROSCOPIC
Bilirubin Urine: NEGATIVE
Hgb urine dipstick: NEGATIVE
Ketones, ur: NEGATIVE
Leukocytes,Ua: NEGATIVE
Nitrite: NEGATIVE
Specific Gravity, Urine: 1.015 (ref 1.000–1.030)
Total Protein, Urine: NEGATIVE
Urine Glucose: NEGATIVE
Urobilinogen, UA: 0.2 (ref 0.0–1.0)
pH: 7 (ref 5.0–8.0)

## 2023-08-12 LAB — HEPATIC FUNCTION PANEL
ALT: 42 U/L (ref 0–53)
AST: 28 U/L (ref 0–37)
Albumin: 4.4 g/dL (ref 3.5–5.2)
Alkaline Phosphatase: 75 U/L (ref 39–117)
Bilirubin, Direct: 0.1 mg/dL (ref 0.0–0.3)
Total Bilirubin: 0.4 mg/dL (ref 0.2–1.2)
Total Protein: 7.5 g/dL (ref 6.0–8.3)

## 2023-08-12 LAB — HEMOGLOBIN A1C: Hgb A1c MFr Bld: 6.3 % (ref 4.6–6.5)

## 2023-08-12 LAB — TSH: TSH: 1.26 u[IU]/mL (ref 0.35–5.50)

## 2023-08-12 NOTE — Patient Instructions (Signed)
Health Maintenance, Male Adopting a healthy lifestyle and getting preventive care are important in promoting health and wellness. Ask your health care provider about: The right schedule for you to have regular tests and exams. Things you can do on your own to prevent diseases and keep yourself healthy. What should I know about diet, weight, and exercise? Eat a healthy diet  Eat a diet that includes plenty of vegetables, fruits, low-fat dairy products, and lean protein. Do not eat a lot of foods that are high in solid fats, added sugars, or sodium. Maintain a healthy weight Body mass index (BMI) is a measurement that can be used to identify possible weight problems. It estimates body fat based on height and weight. Your health care provider can help determine your BMI and help you achieve or maintain a healthy weight. Get regular exercise Get regular exercise. This is one of the most important things you can do for your health. Most adults should: Exercise for at least 150 minutes each week. The exercise should increase your heart rate and make you sweat (moderate-intensity exercise). Do strengthening exercises at least twice a week. This is in addition to the moderate-intensity exercise. Spend less time sitting. Even light physical activity can be beneficial. Watch cholesterol and blood lipids Have your blood tested for lipids and cholesterol at 65 years of age, then have this test every 5 years. You may need to have your cholesterol levels checked more often if: Your lipid or cholesterol levels are high. You are older than 65 years of age. You are at high risk for heart disease. What should I know about cancer screening? Many types of cancers can be detected early and may often be prevented. Depending on your health history and family history, you may need to have cancer screening at various ages. This may include screening for: Colorectal cancer. Prostate cancer. Skin cancer. Lung  cancer. What should I know about heart disease, diabetes, and high blood pressure? Blood pressure and heart disease High blood pressure causes heart disease and increases the risk of stroke. This is more likely to develop in people who have high blood pressure readings or are overweight. Talk with your health care provider about your target blood pressure readings. Have your blood pressure checked: Every 3-5 years if you are 18-39 years of age. Every year if you are 40 years old or older. If you are between the ages of 65 and 75 and are a current or former smoker, ask your health care provider if you should have a one-time screening for abdominal aortic aneurysm (AAA). Diabetes Have regular diabetes screenings. This checks your fasting blood sugar level. Have the screening done: Once every three years after age 45 if you are at a normal weight and have a low risk for diabetes. More often and at a younger age if you are overweight or have a high risk for diabetes. What should I know about preventing infection? Hepatitis B If you have a higher risk for hepatitis B, you should be screened for this virus. Talk with your health care provider to find out if you are at risk for hepatitis B infection. Hepatitis C Blood testing is recommended for: Everyone born from 1945 through 1965. Anyone with known risk factors for hepatitis C. Sexually transmitted infections (STIs) You should be screened each year for STIs, including gonorrhea and chlamydia, if: You are sexually active and are younger than 65 years of age. You are older than 65 years of age and your   health care provider tells you that you are at risk for this type of infection. Your sexual activity has changed since you were last screened, and you are at increased risk for chlamydia or gonorrhea. Ask your health care provider if you are at risk. Ask your health care provider about whether you are at high risk for HIV. Your health care provider  may recommend a prescription medicine to help prevent HIV infection. If you choose to take medicine to prevent HIV, you should first get tested for HIV. You should then be tested every 3 months for as long as you are taking the medicine. Follow these instructions at home: Alcohol use Do not drink alcohol if your health care provider tells you not to drink. If you drink alcohol: Limit how much you have to 0-2 drinks a day. Know how much alcohol is in your drink. In the [place] one drink equals one 12 oz bottle of beer (355 mL), one 5 oz glass of wine (148 mL), or one 1 oz glass of hard liquor (44 mL). Lifestyle Do not use any products that contain nicotine or tobacco. These products include cigarettes, chewing tobacco, and vaping devices, such as e-cigarettes. If you need help quitting, ask your health care provider. Do not use street drugs. Do not share needles. Ask your health care provider for help if you need support or information about quitting drugs. General instructions Schedule regular health, dental, and eye exams. Stay current with your vaccines. Tell your health care provider if: You often feel depressed. You have ever been abused or do not feel safe at home. Summary Adopting a healthy lifestyle and getting preventive care are important in promoting health and wellness. Follow your health care provider's instructions about healthy diet, exercising, and getting tested or screened for diseases. Follow your health care provider's instructions on monitoring your cholesterol and blood pressure. This information is not intended to replace advice given to you by your health care provider. Make sure you discuss any questions you have with your health care provider. Document Revised: 02/06/2021 Document Reviewed: 02/06/2021 Elsevier Patient Education  2024 Elsevier Inc.  

## 2023-08-12 NOTE — Progress Notes (Unsigned)
Subjective:  [name], male    DOB: [birth year]  Age: 67 y.o. MRN: [id number]  CC: Annual Exam, Hypertension, Hyperlipidemia, and Gastroesophageal Reflux   HPI Steven Hickman presents for a CPX and f/up --   Discussed the use of AI scribe software for clinical note transcription with the patient, who gave verbal consent to proceed.  History of Present Illness   The patient, with a history of hypertension, presents with concerns about inconsistent blood pressure readings at home. He denies any associated symptoms such as headache or blurred vision. He reports a long-standing history of exertional dyspnea, but denies any associated chest pain or cold sweats during these episodes. However, he does report intermittent night sweats for the past three to four years, the cause of which is unknown to him.  The patient has a history of smoking, but has since quit. He consumes alcohol occasionally, typically after yard work or during dinner outings. Despite efforts to lose weight through regular exercise such as walking dogs and using a treadmill, he reports difficulty in maintaining weight loss.  He reports a weak urinary stream but denies any pain or blood in the urine.       Outpatient Medications Prior to Visit  Medication Sig Dispense Refill   aspirin EC 81 MG tablet Take 81 mg by mouth daily. Swallow whole.     Cholecalciferol 50 MCG (2000 UT) TABS Take 1 tablet (2,000 Units total) by mouth daily. 90 tablet 3   doxazosin (CARDURA) 2 MG tablet Take 1 tablet by mouth once daily 90 tablet 0   Fluticasone Propionate (FLONASE NA) Place into the nose.     FOLIC ACID PO Take by mouth.     Multiple Vitamin (MULTIVITAMIN) capsule Take 1 capsule by mouth daily.     OMEPRAZOLE PO Take by mouth.     potassium chloride (KLOR-CON) 10 MEQ tablet TAKE 1 TABLET BY MOUTH THREE TIMES DAILY 270 tablet 0   Probiotic Product (PROBIOTIC PO) Take by mouth.     EDARBYCLOR 40-12.5 MG TABS TAKE 1 TABLET  BY MOUTH ONCE DAILY 90 tablet 0   pravastatin (PRAVACHOL) 20 MG tablet Take 1 tablet by mouth once daily 90 tablet 0   No facility-administered medications prior to visit.    ROS Review of Systems  Constitutional:  Negative for chills, diaphoresis, fatigue and fever.  HENT: Negative.    Eyes:  Negative for visual disturbance.  Respiratory:  Positive for shortness of breath (DOE). Negative for cough, chest tightness, wheezing and stridor.   Cardiovascular:  Negative for chest pain, palpitations and leg swelling.  Gastrointestinal: Negative.  Negative for abdominal pain, diarrhea and nausea.  Genitourinary:  Positive for difficulty urinating. Negative for dysuria and penile pain.       +++weak urine stream  Musculoskeletal: Negative.  Negative for arthralgias and myalgias.  Skin: Negative.   Neurological: Negative.  Negative for dizziness and weakness.  Hematological:  Negative for adenopathy. Does not bruise/bleed easily.  Psychiatric/Behavioral:  Positive for confusion and decreased concentration. Negative for behavioral problems and self-injury. The patient is not nervous/anxious.     Objective:  BP (!) 144/78 (BP Location: Right Arm, Patient Position: Sitting, Cuff Size: Large)   Pulse 78   Temp 98.1 F (36.7 C) (Oral)   Resp 16   Ht 5\' 6"  (1.676 m)   Wt 207 lb 3.2 oz (94 kg)   SpO2 93%   BMI 33.44 kg/m   BP Readings from Last 3 Encounters:  08/12/23 (!) 144/78  02/11/23 (!) 142/82  06/13/22 (!) 140/76    Wt Readings from Last 3 Encounters:  08/12/23 207 lb 3.2 oz (94 kg)  02/11/23 204 lb (92.5 kg)  06/13/22 199 lb (90.3 kg)    Physical Exam Vitals reviewed.  Constitutional:      Appearance: Normal appearance.  HENT:     Nose: Nose normal.     Mouth/Throat:     Mouth: Mucous membranes are moist.  Eyes:     General: No scleral icterus.    Conjunctiva/sclera: Conjunctivae normal.  Cardiovascular:     Rate and Rhythm: Normal rate and regular rhythm.      Heart sounds: Normal heart sounds, S1 normal and S2 normal. No murmur heard.    Comments: EKG- NSR, 83 bpm No LVH, Q waves, or ST/T waves  Pulmonary:     Breath sounds: No stridor. No wheezing, rhonchi or rales.  Abdominal:     General: Abdomen is protuberant. Bowel sounds are normal. There is no distension.     Palpations: Abdomen is soft. There is no hepatomegaly, splenomegaly or mass.     Tenderness: There is no abdominal tenderness. There is no guarding.     Hernia: No hernia is present. There is no hernia in the left inguinal area or right inguinal area.  Genitourinary:    Pubic Area: No rash.      Penis: Normal and circumcised.      Testes: Normal.     Epididymis:     Right: Normal.     Left: Normal.     Prostate: Enlarged. Not tender and no nodules present.     Rectum: Guaiac result positive. Internal hemorrhoid present. No mass, tenderness, anal fissure or external hemorrhoid. Normal anal tone.  Musculoskeletal:     Cervical back: Neck supple.     Right lower leg: No edema.     Left lower leg: No edema.  Lymphadenopathy:     Cervical: No cervical adenopathy.     Lower Body: No right inguinal adenopathy. No left inguinal adenopathy.  Skin:    General: Skin is warm and dry.  Neurological:     General: No focal deficit present.     Mental Status: He is alert. Mental status is at baseline.  Psychiatric:        Mood and Affect: Mood normal.        Behavior: Behavior normal.     Lab Results  Component Value Date   WBC 8.4 08/12/2023   HGB 14.7 08/12/2023   HCT 43.4 08/12/2023   PLT 269.0 08/12/2023   GLUCOSE 111 (H) 08/12/2023   CHOL 178 08/12/2023   TRIG 127.0 08/12/2023   HDL 59.80 08/12/2023   LDLDIRECT 113.0 10/15/2018   LDLCALC 92 08/12/2023   ALT 42 08/12/2023   AST 28 08/12/2023   NA 142 08/12/2023   K 4.2 08/12/2023   CL 100 08/12/2023   CREATININE 1.03 08/12/2023   BUN 17 08/12/2023   CO2 31 08/12/2023   TSH 1.26 08/12/2023   PSA 0.96 08/12/2023    HGBA1C 6.3 08/12/2023    DG Hand Complete Left  Result Date: 06/14/2020 CLINICAL DATA:  Staple in hand due to misfiring of staple gun EXAM: LEFT HAND - COMPLETE 3+ VIEW COMPARISON:  None. FINDINGS: Frontal, oblique, and lateral views were obtained. A metallic staple is noted volar to the proximal aspect of the fifth proximal phalanx. The inferior aspect of the staple appears to abut but not directly  invade the volar aspect of the proximal portion of the fifth proximal phalanx. No other radiopaque foreign body evident. No dislocation. There is narrowing of the radiocarpal joint as well as narrowing of the scaphotrapezial joint. There is mild osteoarthritic change in third and fourth DIP joints. A benign cystic areas noted in the distal ulna laterally. There is calcification in the triangular fibrocartilage region which may represent residua of old trauma. There is a positive ulnar variance. IMPRESSION: Staple in volar aspect of hand at the level of the fifth proximal phalanx. The inferior aspect of the staple appears to abut but not frankly invade the bone in this area. No other radiopaque foreign body. No fracture or dislocation evident. 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 Positive ulnar variance evident. These results will be called to the ordering clinician or representative by the Radiologist Assistant, and communication documented in the PACS or [employer]. Electronically Signed   By: Bretta Bang III M.D.   On: 06/14/2020 15:27    Assessment & Plan:   Prediabetes -     Hemoglobin A1c; Future -     Basic metabolic panel; Future  BPH with obstruction/lower urinary tract symptoms -     PSA; Future -     Urinalysis, Routine w reflex microscopic; Future  Dyslipidemia, goal LDL below 100 - LDL goal achieved. Doing well on the statin  -     Lipid panel; Future -     TSH; Future -     Hepatic function panel; Future -     CT CARDIAC SCORING (SELF PAY ONLY); Future -      Pravastatin Sodium; Take 1 tablet (20 mg total) by mouth daily.  Dispense: 90 tablet; Refill: 1  Essential hypertension- He has not achieved his BP goal. Will improve his lifestyle modifications. -     TSH; Future -     Urinalysis, Routine w reflex microscopic; Future -     CBC with Differential/Platelet; Future -     EKG 12-Lead -     Edarbyclor; Take 1 tablet by mouth daily.  Dispense: 90 tablet; Refill: 1  Gastroesophageal reflux disease without esophagitis -     CBC with Differential/Platelet; Future  DOE (dyspnea on exertion)- Will risk stratify with a CCS. -     EKG 12-Lead -     CT CARDIAC SCORING (SELF PAY ONLY); Future  Encounter for general adult medical examination with abnormal findings - Exam completed, labs reviewed, vaccines reviewed, cancer screenings addressed, pt ed material was given.      Follow-up: Return in about 6 months (around 02/09/2024).  Sanda Linger, MD

## 2023-08-13 ENCOUNTER — Other Ambulatory Visit: Payer: Self-pay | Admitting: Internal Medicine

## 2023-08-13 DIAGNOSIS — I10 Essential (primary) hypertension: Secondary | ICD-10-CM

## 2023-08-13 DIAGNOSIS — Z0001 Encounter for general adult medical examination with abnormal findings: Secondary | ICD-10-CM | POA: Insufficient documentation

## 2023-08-13 MED ORDER — PRAVASTATIN SODIUM 20 MG PO TABS: 20.0000 mg | ORAL_TABLET | Freq: Every day | ORAL | 1 refills | Status: DC

## 2023-08-13 MED ORDER — EDARBYCLOR 40-12.5 MG PO TABS: 1.0000 | ORAL_TABLET | Freq: Every day | ORAL | 1 refills | Status: DC

## 2023-08-16 ENCOUNTER — Other Ambulatory Visit: Payer: Self-pay

## 2023-08-16 ENCOUNTER — Telehealth: Payer: Self-pay | Admitting: Internal Medicine

## 2023-08-16 DIAGNOSIS — I10 Essential (primary) hypertension: Secondary | ICD-10-CM

## 2023-08-16 MED ORDER — EDARBYCLOR 40-12.5 MG PO TABS: 1.0000 | ORAL_TABLET | Freq: Every day | ORAL | 1 refills | Status: DC

## 2023-08-16 NOTE — Telephone Encounter (Signed)
Prescription Request  08/16/2023  LOV: 08/12/2023  Rx sent to the wrong location needing Rx sent to this location below.   What is the name of the medication or equipment? EDARBYCLOR 40-12.5 MG TABS   Have you contacted your pharmacy to request a refill? No   Which pharmacy would you like this sent to?    [facility] - [place], [place] - [address] [place] [address] Phone: [phone] Fax: [phone]  Patient notified that their request is being sent to the clinical staff for review and that they should receive a response within 2 business days.   Please advise at Mobile [phone] (mobile)

## 2023-08-16 NOTE — Telephone Encounter (Signed)
 Medication refill has been sent to Dr. Yetta Barre

## 2023-08-27 ENCOUNTER — Ambulatory Visit
Admission: RE | Admit: 2023-08-27 | Discharge: 2023-08-27 | Disposition: A | Discharge status: Home / Self Care | Payer: Medicare Other | Source: Ambulatory Visit | Attending: Internal Medicine | Admitting: Internal Medicine

## 2023-08-27 DIAGNOSIS — R0609 Other forms of dyspnea: Secondary | ICD-10-CM

## 2023-08-27 DIAGNOSIS — E785 Hyperlipidemia, unspecified: Secondary | ICD-10-CM

## 2023-08-27 DIAGNOSIS — I2510 Atherosclerotic heart disease of native coronary artery without angina pectoris: Secondary | ICD-10-CM | POA: Diagnosis not present

## 2023-11-05 ENCOUNTER — Other Ambulatory Visit: Payer: Self-pay | Admitting: Internal Medicine

## 2023-11-05 DIAGNOSIS — I10 Essential (primary) hypertension: Secondary | ICD-10-CM

## 2023-11-05 DIAGNOSIS — N138 Other obstructive and reflux uropathy: Secondary | ICD-10-CM

## 2023-11-10 ENCOUNTER — Other Ambulatory Visit: Payer: Self-pay | Admitting: Internal Medicine

## 2023-11-10 DIAGNOSIS — T502X5A Adverse effect of carbonic-anhydrase inhibitors, benzothiadiazides and other diuretics, initial encounter: Secondary | ICD-10-CM

## 2023-11-10 DIAGNOSIS — I10 Essential (primary) hypertension: Secondary | ICD-10-CM

## 2023-11-11 DIAGNOSIS — K080 Exfoliation of teeth due to systemic causes: Secondary | ICD-10-CM | POA: Diagnosis not present

## 2023-12-05 DIAGNOSIS — K080 Exfoliation of teeth due to systemic causes: Secondary | ICD-10-CM | POA: Diagnosis not present

## 2024-01-02 ENCOUNTER — Ambulatory Visit: Payer: Medicare Other

## 2024-01-24 ENCOUNTER — Ambulatory Visit: Admitting: Internal Medicine

## 2024-01-24 VITALS — BP 140/80 | HR 78 | Ht 66.0 in | Wt 214.6 lb

## 2024-01-24 DIAGNOSIS — H9193 Unspecified hearing loss, bilateral: Secondary | ICD-10-CM | POA: Diagnosis not present

## 2024-01-24 DIAGNOSIS — Z0000 Encounter for general adult medical examination without abnormal findings: Secondary | ICD-10-CM | POA: Diagnosis not present

## 2024-01-24 NOTE — Patient Instructions (Signed)
 Mr. Coakley , Thank you for taking time to come for your Medicare Wellness Visit. I appreciate your ongoing commitment to your health goals. Please review the following plan we discussed and let me know if I can assist you in the future.   Referrals/Orders/Follow-Ups/Clinician Recommendations: It was nice to meet you today.  Aim for 30 minutes of exercise or brisk walking, 6-8 glasses of water, and 5 servings of fruits and vegetables each day.   This is a list of the screening recommended for you and due dates:  Health Maintenance  Topic Date Due   Flu Shot  05/01/2024   Medicare Annual Wellness Visit  01/23/2025   Colon Cancer Screening  12/12/2028   DTaP/Tdap/Td vaccine (3 - Td or Tdap) 05/01/2029   Pneumonia Vaccine  Completed   Hepatitis C Screening  Completed   Zoster (Shingles) Vaccine  Completed   HPV Vaccine  Aged Out   Meningitis B Vaccine  Aged Out   COVID-19 Vaccine  Discontinued   Cologuard (Stool DNA test)  Discontinued    Advanced directives: (Copy Requested) Please bring a copy of your health care power of attorney and living will to the office to be added to your chart at your convenience. You can mail to [facility] [address] [place], [place] [address] or email to [email or web address]  Next Medicare Annual Wellness Visit scheduled for next year: Yes

## 2024-01-24 NOTE — Progress Notes (Signed)
 Subjective:   Steven Hickman is a 68 y.o. who presents for a Medicare Wellness preventive visit.  Visit Complete: In person   Persons Participating in Visit: Patient.  AWV Questionnaire: Yes: Patient Medicare AWV questionnaire was completed by the patient on 01/18/2024; I have confirmed that all information answered by patient is correct and no changes since this date.  Cardiac Risk Factors include: advanced age (>35men, >23 women);male gender;hypertension;Other (see comment);dyslipidemia, Risk factor comments: OSA, BPH     Objective:    Today's Vitals   01/24/24 1053 01/24/24 1127  BP: (!) 150/80 (!) 140/80  Pulse: 78   SpO2: 98%   Weight: 214 lb 9.6 oz (97.3 kg)   Height: 5\' 6"  (1.676 m)    Body mass index is 34.64 kg/m.     01/24/2024   11:06 AM  Advanced Directives  Does Patient Have a Medical Advance Directive? Yes  Type of Estate agent of [place];Living will  Copy of Healthcare Power of Attorney in Chart? No - copy requested    Current Medications (verified) Outpatient Encounter Medications as of 01/24/2024  Medication Sig   aspirin  EC 81 MG tablet Take 81 mg by mouth daily. Swallow whole.   Cholecalciferol  50 MCG (2000 UT) TABS Take 1 tablet (2,000 Units total) by mouth daily.   doxazosin  (CARDURA ) 2 MG tablet Take 1 tablet by mouth once daily   EDARBYCLOR  40-12.5 MG TABS Take 1 tablet by mouth daily.   Fluticasone Propionate (FLONASE NA) Place into the nose.   FOLIC ACID PO Take by mouth.   Multiple Vitamin (MULTIVITAMIN) capsule Take 1 capsule by mouth daily.   OMEPRAZOLE  PO Take by mouth.   potassium chloride  (KLOR-CON ) 10 MEQ tablet TAKE 1 TABLET BY MOUTH THREE TIMES DAILY   pravastatin  (PRAVACHOL ) 20 MG tablet Take 1 tablet (20 mg total) by mouth daily.   Probiotic Product (PROBIOTIC PO) Take by mouth.   No facility-administered encounter medications on file as of 01/24/2024.    Allergies (verified) Shellfish allergy    History: Past Medical History:  Diagnosis Date   Allergy    Arthritis    GERD (gastroesophageal reflux disease)    History of nephrolithiasis    Hx of adenomatous colonic polyps 12/18/2021   Hyperlipidemia    Hypertension    Past Surgical History:  Procedure Laterality Date   FOOT SURGERY     KIDNEY STONE SURGERY     TOOTH EXTRACTION     Family History  Problem Relation Age of Onset   Parkinson's disease Sister    Arthritis Other    Diabetes Other    Hyperlipidemia Other    Hypertension Other    Early death Neg Hx    COPD Neg Hx    Cancer Neg Hx    Asthma Neg Hx    Alcohol abuse Neg Hx    Heart disease Neg Hx    Stroke Neg Hx    Kidney disease Neg Hx    Colon cancer Neg Hx    Colon polyps Neg Hx    Esophageal cancer Neg Hx    Rectal cancer Neg Hx    Stomach cancer Neg Hx    Social History   Socioeconomic History   Marital status: Married    Spouse name: Steven Hickman   Number of children: Not on file   Years of education: Not on file   Highest education level: 12th grade  Occupational History   Occupation: RETIRED/Wet Tour manager  Tobacco Use  Smoking status: Former    Current packs/day: 1.00    Average packs/day: 1 pack/day for 15.0 years (15.0 ttl pk-yrs)    Types: Cigarettes    Passive exposure: Past   Smokeless tobacco: Former    Types: Chew    Quit date: 10/01/1978   Tobacco comments:    occ smokes 1 cigar per year.  Vaping Use   Vaping status: Never Used  Substance and Sexual Activity   Alcohol use: Yes    Alcohol/week: 15.0 standard drinks of alcohol    Types: 15 Cans of beer per week   Drug use: No   Sexual activity: Yes    Partners: Female  Other Topics Concern   Not on file  Social History Narrative   Lives with wife and mother-in-law.   Social Drivers of Corporate investment banker Strain: Low Risk  (01/18/2024)   Overall Financial Resource Strain (CARDIA)    Difficulty of Paying Living Expenses: Not very hard  Food Insecurity:  No Food Insecurity (01/18/2024)   Hunger Vital Sign    Worried About Running Out of Food in the Last Year: Never true    Ran Out of Food in the Last Year: Never true  Transportation Needs: No Transportation Needs (01/18/2024)   PRAPARE - Administrator, Civil Service (Medical): No    Lack of Transportation (Non-Medical): No  Physical Activity: Sufficiently Active (01/18/2024)   Exercise Vital Sign    Days of Exercise per Week: 5 days    Minutes of Exercise per Session: 60 min  Stress: No Stress Concern Present (01/18/2024)   [employer] of Occupational Health - Occupational Stress Questionnaire    Feeling of Stress : Not at all  Social Connections: Moderately Isolated (01/18/2024)   Social Connection and Isolation Panel [NHANES]    Frequency of Communication with Friends and Family: Three times a week    Frequency of Social Gatherings with Friends and Family: Once a week    Attends Religious Services: Never    Database administrator or Organizations: No    Attends Engineer, structural: Not on file    Marital Status: Married    Tobacco Counseling Counseling given: Not Answered Tobacco comments: occ smokes 1 cigar per year.    Clinical Intake:  Pre-visit preparation completed: Yes  Pain : No/denies pain     BMI - recorded: 34.64 Nutritional Status: BMI > 30  Obese Nutritional Risks: None Diabetes: No  Lab Results  Component Value Date   HGBA1C 6.3 08/12/2023   HGBA1C 6.0 02/11/2023   HGBA1C 6.0 06/13/2022     How often do you need to have someone help you when you read instructions, pamphlets, or other written materials from your doctor or pharmacy?: 1 - Never     Information entered by :: Dondrea Clendenin, RMA   Activities of Daily Living     01/18/2024    9:57 AM  In your present state of health, do you have any difficulty performing the following activities:  Hearing? 0  Vision? 0  Difficulty concentrating or making decisions? 0   Walking or climbing stairs? 0  Dressing or bathing? 0  Doing errands, shopping? 0  Preparing Food and eating ? N  Using the Toilet? N  In the past six months, have you accidently leaked urine? Y  Do you have problems with loss of bowel control? N  Managing your Medications? N  Managing your Finances? N  Housekeeping or managing your  Housekeeping? N    Patient Care Team: Arcadio Knuckles, MD as PCP - [facility], [facility]  [facility]  Indicate any recent Medical Services you may have received from other than [facility] providers in the past year (date may be approximate).     Assessment:   This is a routine wellness examination for Steven Hickman.  Hearing/Vision screen Hearing Screening - Comments:: Per pt hard of hearing Vision Screening - Comments:: Wears eyeglasses for reading   Goals Addressed               This Visit's Progress     Patient Stated (pt-stated)        Would like to get down in weight to 175lb and lower his BP       Depression Screen     01/24/2024   11:07 AM 02/11/2023    8:05 AM 08/22/2021   10:02 AM 05/09/2020   11:14 AM 03/30/2019    9:56 AM 03/02/2019    1:36 PM 10/15/2018   10:54 AM  PHQ 2/9 Scores  PHQ - 2 Score 0 0 0 1 1 1  0  PHQ- 9 Score 1 0    2     Fall Risk     01/18/2024    9:57 AM 02/11/2023    8:05 AM 03/30/2019    9:56 AM 03/02/2019    1:35 PM 10/15/2018   10:54 AM  Fall Risk   Falls in the past year? 0 1 0 0 0  Number falls in past yr: 0 0   0  Injury with Fall? 1 0   0  Risk for fall due to :  No Fall Risks     Follow up Falls evaluation completed;Falls prevention discussed Falls evaluation completed   Falls evaluation completed    MEDICARE RISK AT HOME:  Medicare Risk at Home Any stairs in or around the home?: (Patient-Rptd) No Home free of loose throw rugs in walkways, pet beds, electrical cords, etc?: (Patient-Rptd) Yes Adequate lighting in your home to reduce risk of  falls?: (Patient-Rptd) Yes Life alert?: (Patient-Rptd) No Use of a cane, walker or w/c?: (Patient-Rptd) No Grab bars in the bathroom?: (Patient-Rptd) Yes Shower chair or bench in shower?: (Patient-Rptd) Yes Elevated toilet seat or a handicapped toilet?: (Patient-Rptd) No  TIMED UP AND GO:  Was the test performed?  Yes  Length of time to ambulate 10 feet: 15 sec Gait steady and fast without use of assistive device  Cognitive Function: Declined/Normal: No cognitive concerns noted by patient or family. Patient alert, oriented, able to answer questions appropriately and recall recent events. No signs of memory loss or confusion.        Immunizations Immunization History  Administered Date(s) Administered   Influenza Split 07/16/2020   Influenza Whole 07/07/2010, 06/04/2011, 07/30/2012   Influenza, High Dose Seasonal PF 07/17/2023   Influenza, Seasonal, Injecte, Preservative Fre 07/24/2017   Influenza,inj,Quad PF,6+ Mos 07/07/2018, 05/02/2019   Influenza-Unspecified 07/03/2021, 05/01/2022   PFIZER(Purple Top)SARS-COV-2 Vaccination 01/28/2020, 02/18/2020, 10/04/2020, 07/03/2021   PNEUMOCOCCAL CONJUGATE-20 02/11/2023   Td 11/23/2009   Tdap 05/02/2019   Zoster Recombinant(Shingrix ) 05/09/2020, 08/08/2020    Screening Tests Health Maintenance  Topic Date Due   INFLUENZA VACCINE  05/01/2024   Medicare Annual Wellness (AWV)  01/23/2025   Colonoscopy  12/12/2028   DTaP/Tdap/Td (3 - Td or Tdap) 05/01/2029   Pneumonia Vaccine 44+ Years old  Completed   Hepatitis C Screening  Completed  Zoster Vaccines- Shingrix   Completed   HPV VACCINES  Aged Out   Meningococcal B Vaccine  Aged Out   COVID-19 Vaccine  Discontinued   Fecal DNA (Cologuard)  Discontinued    Health Maintenance  There are no preventive care reminders to display for this patient. Health Maintenance Items Addressed: See Nurse Notes  Additional Screening:  Vision Screening: Recommended annual ophthalmology exams  for early detection of glaucoma and other disorders of the eye.  Dental Screening: Recommended annual dental exams for proper oral hygiene  Community Resource Referral / Chronic Care Management: CRR required this visit?  No   CCM required this visit?  No     Plan:     I have personally reviewed and noted the following in the patient's chart:   Medical and social history Use of alcohol, tobacco or illicit drugs  Current medications and supplements including opioid prescriptions. Patient is not currently taking opioid prescriptions. Functional ability and status Nutritional status Physical activity Advanced directives List of other physicians Hospitalizations, surgeries, and ER visits in previous 12 months Vitals Screenings to include cognitive, depression, and falls Referrals and appointments  In addition, I have reviewed and discussed with patient certain preventive protocols, quality metrics, and best practice recommendations. A written personalized care plan for preventive services as well as general preventive health recommendations were provided to patient.     Roselene Gray L Xaviar Lunn, CMA   01/24/2024   After Visit Summary: (MyChart) Due to this being a telephonic visit, the after visit summary with patients personalized plan was offered to patient via MyChart   Notes: Please refer to Routing Comments.

## 2024-01-29 DIAGNOSIS — H9193 Unspecified hearing loss, bilateral: Secondary | ICD-10-CM | POA: Insufficient documentation

## 2024-01-29 NOTE — Addendum Note (Signed)
 Addended by: Arcadio Knuckles on: 01/29/2024 05:45 PM   Modules accepted: Orders

## 2024-01-31 ENCOUNTER — Other Ambulatory Visit: Payer: Self-pay | Admitting: Internal Medicine

## 2024-01-31 DIAGNOSIS — N138 Other obstructive and reflux uropathy: Secondary | ICD-10-CM

## 2024-01-31 DIAGNOSIS — I10 Essential (primary) hypertension: Secondary | ICD-10-CM

## 2024-02-04 ENCOUNTER — Other Ambulatory Visit: Payer: Self-pay | Admitting: Internal Medicine

## 2024-02-04 DIAGNOSIS — I10 Essential (primary) hypertension: Secondary | ICD-10-CM

## 2024-02-26 ENCOUNTER — Other Ambulatory Visit: Payer: Self-pay | Admitting: Internal Medicine

## 2024-02-26 ENCOUNTER — Ambulatory Visit: Attending: Internal Medicine | Admitting: Audiologist

## 2024-02-26 DIAGNOSIS — H903 Sensorineural hearing loss, bilateral: Secondary | ICD-10-CM | POA: Diagnosis not present

## 2024-02-26 NOTE — Procedures (Signed)
  [facility]patient [facility] [address] [place], [place] [address] [phone]  AUDIOLOGICAL EVALUATION  Name: Steven Hickman    Status: Outpatient DOB: 1956/08/31    Referent: Arcadio Knuckles, MD MRN: [id number] Date: 02/26/2024     Diagnosis: Sensorineural hearing loss, bilateral   HISTORY: [name], 68 y.o., was seen for an audiological evaluation. [name] notices increased difficulty hearing over the last 4-5 years. He feels that his right ear is his better hearing ear, noting that he can't hardly hearing anything out of his headphones on the left ear. He is using closed captioning for the TV and finds that he cannot hear low talkers. [name] notes no ear pain, pressure, or dizziness.  [name] reports history of occupational and recreational noise exposure. He remembered having hearing screenings at work every year.  There is no history of ear surgery. He notes ear infections as a child.          EVALUATION: Otoscopic inspection reveals minimal cerumen with visible tympanic membranes.   Tympanometry was completed to assess middle ear status. Normal, Type A tympanograms were obtained bilaterally.  Standard audiometric techniques were used to obtain thresholds under insert earphones. Speech reception thresholds are 25 dBHL on the right and 75 dBHL on the left using recorded spondee word lists. Word recognition was 96% at 65 dBHL on the right and 44% at 90 dBHL on the left using recorded NU-6 word lists, in quiet. A mild sloping to moderate high frequency sensorineural hearing loss in present at 1500Hz  and above in the right ear and a moderately severe flat sensorineural hearing loss is present in the left ear.   CONCLUSION:  [name] has a mild sloping to moderate high frequency sensorineural hearing loss in the right ear and a moderately severe flat sensorineural hearing loss in the left ear.  Word recognition is good in quiet at conversational speech levels in the right ear  and poor at elevated levels in the left ear. Findings were reviewed with the patient.   RECOMMENDATIONS: Medical evaluation by an Ear, Nose and Throat physician.   Pending medical clearance, use of binaural amplification to assist in daily listening situations. A list of area hearing aid providers was given to the patient. Use of communication strategies to improve communication in difficult listening situations. Use of hearing protection in noisy situations.  Audiogram scanned in under media tab. 42 minutes spent testing and counseling.  [name], Au.D., CCC-A Audiologist 02/26/2024  cc: Arcadio Knuckles, MD

## 2024-03-03 ENCOUNTER — Other Ambulatory Visit: Payer: Self-pay | Admitting: Internal Medicine

## 2024-03-03 DIAGNOSIS — I10 Essential (primary) hypertension: Secondary | ICD-10-CM

## 2024-03-07 ENCOUNTER — Other Ambulatory Visit: Payer: Self-pay | Admitting: Internal Medicine

## 2024-03-07 DIAGNOSIS — I10 Essential (primary) hypertension: Secondary | ICD-10-CM

## 2024-03-07 DIAGNOSIS — N138 Other obstructive and reflux uropathy: Secondary | ICD-10-CM

## 2024-03-20 ENCOUNTER — Other Ambulatory Visit: Payer: Self-pay | Admitting: Internal Medicine

## 2024-03-20 DIAGNOSIS — I10 Essential (primary) hypertension: Secondary | ICD-10-CM

## 2024-03-20 DIAGNOSIS — E876 Hypokalemia: Secondary | ICD-10-CM

## 2024-03-23 ENCOUNTER — Encounter (INDEPENDENT_AMBULATORY_CARE_PROVIDER_SITE_OTHER): Payer: Self-pay

## 2024-03-23 DIAGNOSIS — K080 Exfoliation of teeth due to systemic causes: Secondary | ICD-10-CM | POA: Diagnosis not present

## 2024-03-24 ENCOUNTER — Ambulatory Visit: Payer: Self-pay | Admitting: Internal Medicine

## 2024-03-24 ENCOUNTER — Ambulatory Visit (INDEPENDENT_AMBULATORY_CARE_PROVIDER_SITE_OTHER): Admitting: Internal Medicine

## 2024-03-24 ENCOUNTER — Encounter: Payer: Self-pay | Admitting: Internal Medicine

## 2024-03-24 VITALS — BP 138/72 | HR 72 | Temp 98.6°F | Resp 16 | Ht 66.0 in | Wt 215.4 lb

## 2024-03-24 DIAGNOSIS — E785 Hyperlipidemia, unspecified: Secondary | ICD-10-CM

## 2024-03-24 DIAGNOSIS — I10 Essential (primary) hypertension: Secondary | ICD-10-CM | POA: Diagnosis not present

## 2024-03-24 DIAGNOSIS — R7303 Prediabetes: Secondary | ICD-10-CM

## 2024-03-24 LAB — BASIC METABOLIC PANEL WITH GFR
BUN: 15 mg/dL (ref 6–23)
CO2: 30 meq/L (ref 19–32)
Calcium: 9.8 mg/dL (ref 8.4–10.5)
Chloride: 98 meq/L (ref 96–112)
Creatinine, Ser: 0.9 mg/dL (ref 0.40–1.50)
GFR: 89.16 mL/min (ref >60.00)
Glucose, Bld: 111 mg/dL — ABNORMAL HIGH (ref 70–99)
Potassium: 3.7 meq/L (ref 3.5–5.1)
Sodium: 137 meq/L (ref 135–145)

## 2024-03-24 LAB — CBC WITH DIFFERENTIAL/PLATELET
Basophils Absolute: 0.1 10*3/uL (ref 0.0–0.1)
Basophils Relative: 0.9 % (ref 0.0–3.0)
Eosinophils Absolute: 0.1 10*3/uL (ref 0.0–0.7)
Eosinophils Relative: 0.9 % (ref 0.0–5.0)
HCT: 42.9 % (ref 39.0–52.0)
Hemoglobin: 14.6 g/dL (ref 13.0–17.0)
Lymphocytes Relative: 31.3 % (ref 12.0–46.0)
Lymphs Abs: 2.5 10*3/uL (ref 0.7–4.0)
MCHC: 34 g/dL (ref 30.0–36.0)
MCV: 90.8 fl (ref 78.0–100.0)
Monocytes Absolute: 0.6 10*3/uL (ref 0.1–1.0)
Monocytes Relative: 8.1 % (ref 3.0–12.0)
Neutro Abs: 4.7 10*3/uL (ref 1.4–7.7)
Neutrophils Relative %: 58.8 % (ref 43.0–77.0)
Platelets: 200 10*3/uL (ref 150.0–400.0)
RBC: 4.73 Mil/uL (ref 4.22–5.81)
RDW: 13.6 % (ref 11.5–15.5)
WBC: 7.9 10*3/uL (ref 4.0–10.5)

## 2024-03-24 LAB — HEPATIC FUNCTION PANEL
ALT: 29 U/L (ref 0–53)
AST: 23 U/L (ref 0–37)
Albumin: 4.4 g/dL (ref 3.5–5.2)
Alkaline Phosphatase: 65 U/L (ref 39–117)
Bilirubin, Direct: 0.1 mg/dL (ref 0.0–0.3)
Total Bilirubin: 0.4 mg/dL (ref 0.2–1.2)
Total Protein: 7.5 g/dL (ref 6.0–8.3)

## 2024-03-24 LAB — HEMOGLOBIN A1C: Hgb A1c MFr Bld: 6.2 % (ref 4.6–6.5)

## 2024-03-24 MED ORDER — PRAVASTATIN SODIUM 20 MG PO TABS: 20.0000 mg | ORAL_TABLET | Freq: Every day | ORAL | 1 refills | Status: DC

## 2024-03-24 MED ORDER — EDARBYCLOR 40-12.5 MG PO TABS: 1.0000 | ORAL_TABLET | Freq: Every day | ORAL | 1 refills | Status: DC

## 2024-03-24 NOTE — Patient Instructions (Signed)
 Hypertension, Adult High blood pressure (hypertension) is when the force of blood pumping through the arteries is too strong. The arteries are the blood vessels that carry blood from the heart throughout the body. Hypertension forces the heart to work harder to pump blood and may cause arteries to become narrow or stiff. Untreated or uncontrolled hypertension can lead to a heart attack, heart failure, a stroke, kidney disease, and other problems. A blood pressure reading consists of a higher number over a lower number. Ideally, your blood pressure should be below 120/80. The first ("top") number is called the systolic pressure. It is a measure of the pressure in your arteries as your heart beats. The second ("bottom") number is called the diastolic pressure. It is a measure of the pressure in your arteries as the heart relaxes. What are the causes? The exact cause of this condition is not known. There are some conditions that result in high blood pressure. What increases the risk? Certain factors may make you more likely to develop high blood pressure. Some of these risk factors are under your control, including: Smoking. Not getting enough exercise or physical activity. Being overweight. Having too much fat, sugar, calories, or salt (sodium) in your diet. Drinking too much alcohol. Other risk factors include: Having a personal history of heart disease, diabetes, high cholesterol, or kidney disease. Stress. Having a family history of high blood pressure and high cholesterol. Having obstructive sleep apnea. Age. The risk increases with age. What are the signs or symptoms? High blood pressure may not cause symptoms. Very high blood pressure (hypertensive crisis) may cause: Headache. Fast or irregular heartbeats (palpitations). Shortness of breath. Nosebleed. Nausea and vomiting. Vision changes. Severe chest pain, dizziness, and seizures. How is this diagnosed? This condition is diagnosed by  measuring your blood pressure while you are seated, with your arm resting on a flat surface, your legs uncrossed, and your feet flat on the floor. The cuff of the blood pressure monitor will be placed directly against the skin of your upper arm at the level of your heart. Blood pressure should be measured at least twice using the same arm. Certain conditions can cause a difference in blood pressure between your right and left arms. If you have a high blood pressure reading during one visit or you have normal blood pressure with other risk factors, you may be asked to: Return on a different day to have your blood pressure checked again. Monitor your blood pressure at home for 1 week or longer. If you are diagnosed with hypertension, you may have other blood or imaging tests to help your health care provider understand your overall risk for other conditions. How is this treated? This condition is treated by making healthy lifestyle changes, such as eating healthy foods, exercising more, and reducing your alcohol intake. You may be referred for counseling on a healthy diet and physical activity. Your health care provider may prescribe medicine if lifestyle changes are not enough to get your blood pressure under control and if: Your systolic blood pressure is above 130. Your diastolic blood pressure is above 80. Your personal target blood pressure may vary depending on your medical conditions, your age, and other factors. Follow these instructions at home: Eating and drinking  Eat a diet that is high in fiber and potassium, and low in sodium, added sugar, and fat. An example of this eating plan is called the DASH diet. DASH stands for Dietary Approaches to Stop Hypertension. To eat this way: Eat  plenty of fresh fruits and vegetables. Try to fill one half of your plate at each meal with fruits and vegetables. Eat whole grains, such as whole-wheat pasta, brown rice, or whole-grain bread. Fill about one  fourth of your plate with whole grains. Eat or drink low-fat dairy products, such as skim milk or low-fat yogurt. Avoid fatty cuts of meat, processed or cured meats, and poultry with skin. Fill about one fourth of your plate with lean proteins, such as fish, chicken without skin, beans, eggs, or tofu. Avoid pre-made and processed foods. These tend to be higher in sodium, added sugar, and fat. Reduce your daily sodium intake. Many people with hypertension should eat less than 1,500 mg of sodium a day. Do not drink alcohol if: Your health care provider tells you not to drink. You are pregnant, may be pregnant, or are planning to become pregnant. If you drink alcohol: Limit how much you have to: 0-1 drink a day for women. 0-2 drinks a day for men. Know how much alcohol is in your drink. In the [place] one drink equals one 12 oz bottle of beer (355 mL), one 5 oz glass of wine (148 mL), or one 1 oz glass of hard liquor (44 mL). Lifestyle  Work with your health care provider to maintain a healthy body weight or to lose weight. Ask what an ideal weight is for you. Get at least 30 minutes of exercise that causes your heart to beat faster (aerobic exercise) most days of the week. Activities may include walking, swimming, or biking. Include exercise to strengthen your muscles (resistance exercise), such as Pilates or lifting weights, as part of your weekly exercise routine. Try to do these types of exercises for 30 minutes at least 3 days a week. Do not use any products that contain nicotine or tobacco. These products include cigarettes, chewing tobacco, and vaping devices, such as e-cigarettes. If you need help quitting, ask your health care provider. Monitor your blood pressure at home as told by your health care provider. Keep all follow-up visits. This is important. Medicines Take over-the-counter and prescription medicines only as told by your health care provider. Follow directions carefully. Blood  pressure medicines must be taken as prescribed. Do not skip doses of blood pressure medicine. Doing this puts you at risk for problems and can make the medicine less effective. Ask your health care provider about side effects or reactions to medicines that you should watch for. Contact a health care provider if you: Think you are having a reaction to a medicine you are taking. Have headaches that keep coming back (recurring). Feel dizzy. Have swelling in your ankles. Have trouble with your vision. Get help right away if you: Develop a severe headache or confusion. Have unusual weakness or numbness. Feel faint. Have severe pain in your chest or abdomen. Vomit repeatedly. Have trouble breathing. These symptoms may be an emergency. Get help right away. Call 911. Do not wait to see if the symptoms will go away. Do not drive yourself to the hospital. Summary Hypertension is when the force of blood pumping through your arteries is too strong. If this condition is not controlled, it may put you at risk for serious complications. Your personal target blood pressure may vary depending on your medical conditions, your age, and other factors. For most people, a normal blood pressure is less than 120/80. Hypertension is treated with lifestyle changes, medicines, or a combination of both. Lifestyle changes include losing weight, eating a healthy,  low-sodium diet, exercising more, and limiting alcohol. This information is not intended to replace advice given to you by your health care provider. Make sure you discuss any questions you have with your health care provider. Document Revised: 07/25/2021 Document Reviewed: 07/25/2021 Elsevier Patient Education  2024 [employer].

## 2024-03-24 NOTE — Progress Notes (Unsigned)
 Subjective:  [name], male    DOB: 06-22-1956  Age: 68 y.o. MRN: [id number]  CC: Osteoarthritis, Hypertension, and Hyperlipidemia   HPI Steven Hickman presents for f/up  ---  Discussed the use of AI scribe software for clinical note transcription with the patient, who gave verbal consent to proceed.  History of Present Illness   Steven Hickman is a 68 year old male who presents for follow-up regarding shortness of breath and joint pain.  Shortness of breath occurs only during exercise or when bending down. He feels energized during exercise and does not experience chest pain, dizziness, or peripheral edema. However, he sometimes experiences leg cramps during exercise.  He experiences discomfort in his hip, which has led to a reduction in his exercise routine. Currently, he engages in about thirty minutes of exercise daily, including outdoor activities and walking his dogs. He does not take any medication for joint pain but occasionally uses Advil  for headaches.  He has been experiencing issues with his thumb but does not take any specific medication for this condition.  He has difficulty losing weight despite his efforts.  His blood pressure readings are typically around 138/72.  He recently had his ears checked and is awaiting further contact from the ear doctor due to hearing issues in one ear.       Outpatient Medications Prior to Visit  Medication Sig Dispense Refill   aspirin  EC 81 MG tablet Take 81 mg by mouth daily. Swallow whole.     Cholecalciferol  50 MCG (2000 UT) TABS Take 1 tablet (2,000 Units total) by mouth daily. 90 tablet 3   doxazosin  (CARDURA ) 2 MG tablet Take 1 tablet by mouth once daily 90 tablet 1   Fluticasone Propionate (FLONASE NA) Place into the nose.     FOLIC ACID PO Take by mouth.     Multiple Vitamin (MULTIVITAMIN) capsule Take 1 capsule by mouth daily.     OMEPRAZOLE  PO Take by mouth.     potassium chloride  (KLOR-CON ) 10 MEQ tablet  TAKE 1 TABLET BY MOUTH THREE TIMES DAILY 270 tablet 0   Probiotic Product (PROBIOTIC PO) Take by mouth.     EDARBYCLOR  40-12.5 MG TABS TAKE 1 TABLET BY MOUTH ONCE DAILY **MUST MAKE APPOINTMENT FOR REFILLS** 30 tablet 0   pravastatin  (PRAVACHOL ) 20 MG tablet Take 1 tablet (20 mg total) by mouth daily. 90 tablet 1   No facility-administered medications prior to visit.    ROS Review of Systems  Objective:  BP 138/72 (BP Location: Left Arm, Patient Position: Sitting, Cuff Size: Normal)   Pulse 72   Temp 98.6 F (37 C) (Oral)   Resp 16   Ht 5' 6 (1.676 m)   Wt 215 lb 6.4 oz (97.7 kg)   SpO2 96%   BMI 34.77 kg/m   BP Readings from Last 3 Encounters:  03/24/24 138/72  01/24/24 (!) 140/80  08/12/23 (!) 144/78    Wt Readings from Last 3 Encounters:  03/24/24 215 lb 6.4 oz (97.7 kg)  01/24/24 214 lb 9.6 oz (97.3 kg)  08/12/23 207 lb 3.2 oz (94 kg)    Physical Exam  Lab Results  Component Value Date   WBC 7.9 03/24/2024   HGB 14.6 03/24/2024   HCT 42.9 03/24/2024   PLT 200.0 03/24/2024   GLUCOSE 111 (H) 03/24/2024   CHOL 178 08/12/2023   TRIG 127.0 08/12/2023   HDL 59.80 08/12/2023   LDLDIRECT 113.0 10/15/2018   LDLCALC 92 08/12/2023  ALT 29 03/24/2024   AST 23 03/24/2024   NA 137 03/24/2024   K 3.7 03/24/2024   CL 98 03/24/2024   CREATININE 0.90 03/24/2024   BUN 15 03/24/2024   CO2 30 03/24/2024   TSH 1.26 08/12/2023   PSA 0.96 08/12/2023   HGBA1C 6.2 03/24/2024    CT CARDIAC SCORING ([facility] LOCATIONS ONLY) Result Date: 08/27/2023 CLINICAL DATA:  68 year old Caucasian male with history of hyperlipidemia presenting with dyspnea. * Tracking Code: FCC * EXAM: CT CARDIAC CORONARY ARTERY CALCIUM  SCORE TECHNIQUE: Non-contrast imaging through the heart was performed using prospective ECG gating. Image post processing was performed on an independent workstation, allowing for quantitative analysis of the heart and coronary arteries. Note that this exam targets the heart  and the chest was not imaged in its entirety. COMPARISON:  No priors. FINDINGS: CORONARY CALCIUM  SCORES: Left Main: 0 LAD: 10 LCx: 0 RCA/PDA: 1 Total Agatston Score: 11 MESA database percentile: 29th AORTA MEASUREMENTS: Ascending Aorta: 3.2 cm Descending Aorta:2.7 cm OTHER FINDINGS: Within the visualized portions of the thorax there are no suspicious appearing pulmonary nodules or masses, there is no acute consolidative airspace disease, no pleural effusions, no pneumothorax and no lymphadenopathy. Visualized portions of the upper abdomen demonstrate diffuse low attenuation throughout the visualized hepatic parenchyma, indicative of a background of hepatic steatosis. There are no aggressive appearing lytic or blastic lesions noted in the visualized portions of the skeleton. IMPRESSION: 1. Patient's total coronary artery calcium  score is 11 which is 29th percentile for patient's of matched age, gender and race/ethnicity. Please note that although the presence of coronary artery calcium  documents the presence of coronary artery disease, the severity of this disease and any potential stenosis cannot be assessed on this noncontrast CT examination. Assessment for potential risk factor modification, dietary therapy or pharmacologic therapy may be warranted, if clinically indicated. 2. Hepatic steatosis. Electronically Signed   By: Toribio Aye M.D.   On: 08/27/2023 13:49    Assessment & Plan:  Essential hypertension -     CBC with Differential/Platelet; Future -     Basic metabolic panel with GFR; Future -     Hepatic function panel; Future -     Edarbyclor ; Take 1 tablet by mouth daily.  Dispense: 90 tablet; Refill: 1  Prediabetes -     Basic metabolic panel with GFR; Future -     Hemoglobin A1c; Future  Dyslipidemia, goal LDL below 100 -     Pravastatin  Sodium; Take 1 tablet (20 mg total) by mouth daily.  Dispense: 90 tablet; Refill: 1     Follow-up: Return in about 6 months (around  09/23/2024).  Debby Molt, MD

## 2024-05-06 ENCOUNTER — Ambulatory Visit: Admitting: Podiatry

## 2024-05-06 ENCOUNTER — Encounter: Payer: Self-pay | Admitting: Podiatry

## 2024-05-06 VITALS — Ht 66.0 in | Wt 215.0 lb

## 2024-05-06 DIAGNOSIS — B351 Tinea unguium: Secondary | ICD-10-CM

## 2024-05-06 DIAGNOSIS — L84 Corns and callosities: Secondary | ICD-10-CM | POA: Diagnosis not present

## 2024-05-06 DIAGNOSIS — M21619 Bunion of unspecified foot: Secondary | ICD-10-CM | POA: Diagnosis not present

## 2024-05-06 DIAGNOSIS — M79674 Pain in right toe(s): Secondary | ICD-10-CM | POA: Diagnosis not present

## 2024-05-06 DIAGNOSIS — M79675 Pain in left toe(s): Secondary | ICD-10-CM

## 2024-05-07 NOTE — Progress Notes (Signed)
 Subjective:   [name], male   DOB: 68 y.o.   MRN: [id number]   HPI Patient presents as a new patient with severe lesion formation subfirst and fifth metatarsals of both feet structural deformity of both feet and nail disease painful.  Patient has caregiver with  him and they cannot do anything with the structural abnormalities present.  Patient does not currently smoke and tries to be active   Review of Systems  All other systems reviewed and are negative.       Objective:  Physical Exam Vitals and nursing note reviewed.  Constitutional:      Appearance: He is well-developed.  Pulmonary:     Effort: Pulmonary effort is normal.  Musculoskeletal:        General: Normal range of motion.  Skin:    General: Skin is warm.  Neurological:     Mental Status: He is alert.     Neurovascular status found to be intact bilateral with diminished range of motion around the subtalar midtarsal joint and muscle strength but adequate.  Patient is found to have severe structural skin with large keratotic lesion subfirst and fifth metatarsal of both feet that are painful when pressed making walking difficult and elongated nailbeds 1-5 both feet that are thick and discomforting and patient cannot cut.  Patient also has structural deformity of both feet moderate bunion which leads to the pressure on the feet     Assessment:  Difficult condition with probable hereditary skin with keratotic lesion anywhere pressure points or form structural bunion and mycotic nail with pain     Plan:  H&P reviewed all conditions and at this point I did deep debridement of callus tissue with instrument no bleeding or iatrogenic bleeding was noted and then debrided nailbeds 1-5 both feet.  I reviewed structural correction but I do not see this is something that would be of benefit to him and patient will wear cushioned shoes to the best of his ability.  Patient will be seen back as symptoms indicate

## 2024-06-26 ENCOUNTER — Other Ambulatory Visit: Payer: Self-pay | Admitting: Internal Medicine

## 2024-06-26 DIAGNOSIS — I10 Essential (primary) hypertension: Secondary | ICD-10-CM

## 2024-06-26 DIAGNOSIS — E876 Hypokalemia: Secondary | ICD-10-CM

## 2024-07-27 DIAGNOSIS — K080 Exfoliation of teeth due to systemic causes: Secondary | ICD-10-CM | POA: Diagnosis not present

## 2024-09-02 ENCOUNTER — Other Ambulatory Visit: Payer: Self-pay | Admitting: Internal Medicine

## 2024-09-02 DIAGNOSIS — I10 Essential (primary) hypertension: Secondary | ICD-10-CM

## 2024-09-02 DIAGNOSIS — N138 Other obstructive and reflux uropathy: Secondary | ICD-10-CM

## 2024-09-07 ENCOUNTER — Ambulatory Visit: Admitting: Podiatry

## 2024-09-12 ENCOUNTER — Other Ambulatory Visit: Payer: Self-pay | Admitting: Internal Medicine

## 2024-09-12 DIAGNOSIS — E785 Hyperlipidemia, unspecified: Secondary | ICD-10-CM

## 2024-09-22 ENCOUNTER — Ambulatory Visit: Payer: Self-pay | Admitting: Internal Medicine

## 2024-09-22 ENCOUNTER — Ambulatory Visit (INDEPENDENT_AMBULATORY_CARE_PROVIDER_SITE_OTHER): Admitting: Internal Medicine

## 2024-09-22 ENCOUNTER — Encounter: Payer: Self-pay | Admitting: Internal Medicine

## 2024-09-22 VITALS — BP 138/88 | HR 81 | Temp 98.6°F | Resp 16 | Ht 66.0 in | Wt 218.4 lb

## 2024-09-22 DIAGNOSIS — L989 Disorder of the skin and subcutaneous tissue, unspecified: Secondary | ICD-10-CM

## 2024-09-22 DIAGNOSIS — Z0000 Encounter for general adult medical examination without abnormal findings: Secondary | ICD-10-CM

## 2024-09-22 DIAGNOSIS — H903 Sensorineural hearing loss, bilateral: Secondary | ICD-10-CM

## 2024-09-22 DIAGNOSIS — R7303 Prediabetes: Secondary | ICD-10-CM

## 2024-09-22 DIAGNOSIS — N138 Other obstructive and reflux uropathy: Secondary | ICD-10-CM

## 2024-09-22 DIAGNOSIS — N401 Enlarged prostate with lower urinary tract symptoms: Secondary | ICD-10-CM | POA: Diagnosis not present

## 2024-09-22 DIAGNOSIS — I10 Essential (primary) hypertension: Secondary | ICD-10-CM | POA: Diagnosis not present

## 2024-09-22 DIAGNOSIS — E785 Hyperlipidemia, unspecified: Secondary | ICD-10-CM

## 2024-09-22 DIAGNOSIS — G4733 Obstructive sleep apnea (adult) (pediatric): Secondary | ICD-10-CM | POA: Diagnosis not present

## 2024-09-22 DIAGNOSIS — Z0001 Encounter for general adult medical examination with abnormal findings: Secondary | ICD-10-CM

## 2024-09-22 LAB — CBC WITH DIFFERENTIAL/PLATELET
Basophils Absolute: 0.1 K/uL (ref 0.0–0.1)
Basophils Relative: 0.8 % (ref 0.0–3.0)
Eosinophils Absolute: 0 K/uL (ref 0.0–0.7)
Eosinophils Relative: 0.2 % (ref 0.0–5.0)
HCT: 45.4 % (ref 39.0–52.0)
Hemoglobin: 15.1 g/dL (ref 13.0–17.0)
Lymphocytes Relative: 27.2 % (ref 12.0–46.0)
Lymphs Abs: 2.6 K/uL (ref 0.7–4.0)
MCHC: 33.3 g/dL (ref 30.0–36.0)
MCV: 90.4 fl (ref 78.0–100.0)
Monocytes Absolute: 0.6 K/uL (ref 0.1–1.0)
Monocytes Relative: 6.2 % (ref 3.0–12.0)
Neutro Abs: 6.3 K/uL (ref 1.4–7.7)
Neutrophils Relative %: 65.6 % (ref 43.0–77.0)
Platelets: 221 K/uL (ref 150.0–400.0)
RBC: 5.02 Mil/uL (ref 4.22–5.81)
RDW: 13.6 % (ref 11.5–15.5)
WBC: 9.6 K/uL (ref 4.0–10.5)

## 2024-09-22 LAB — BASIC METABOLIC PANEL WITH GFR
BUN: 12 mg/dL (ref 6–23)
CO2: 28 meq/L (ref 19–32)
Calcium: 9.3 mg/dL (ref 8.4–10.5)
Chloride: 96 meq/L (ref 96–112)
Creatinine, Ser: 0.92 mg/dL (ref 0.40–1.50)
GFR: 86.53 mL/min
Glucose, Bld: 121 mg/dL — ABNORMAL HIGH (ref 70–99)
Potassium: 3.7 meq/L (ref 3.5–5.1)
Sodium: 136 meq/L (ref 135–145)

## 2024-09-22 LAB — URINALYSIS, ROUTINE W REFLEX MICROSCOPIC
Bilirubin Urine: NEGATIVE
Hgb urine dipstick: NEGATIVE
Ketones, ur: NEGATIVE
Nitrite: NEGATIVE
RBC / HPF: NONE SEEN
Specific Gravity, Urine: 1.01 (ref 1.000–1.030)
Total Protein, Urine: NEGATIVE
Urine Glucose: NEGATIVE
Urobilinogen, UA: 0.2 (ref 0.0–1.0)
pH: 7 (ref 5.0–8.0)

## 2024-09-22 LAB — LIPID PANEL
Cholesterol: 156 mg/dL (ref 28–200)
HDL: 56.4 mg/dL
LDL Cholesterol: 74 mg/dL (ref 10–99)
NonHDL: 99.28
Total CHOL/HDL Ratio: 3
Triglycerides: 127 mg/dL (ref 10.0–149.0)
VLDL: 25.4 mg/dL (ref 0.0–40.0)

## 2024-09-22 LAB — HEPATIC FUNCTION PANEL
ALT: 35 U/L (ref 3–53)
AST: 24 U/L (ref 5–37)
Albumin: 4.5 g/dL (ref 3.5–5.2)
Alkaline Phosphatase: 65 U/L (ref 39–117)
Bilirubin, Direct: 0.1 mg/dL (ref 0.1–0.3)
Total Bilirubin: 0.4 mg/dL (ref 0.2–1.2)
Total Protein: 7.4 g/dL (ref 6.0–8.3)

## 2024-09-22 LAB — PSA: PSA: 1.31 ng/mL (ref 0.10–4.00)

## 2024-09-22 LAB — TSH: TSH: 1.43 u[IU]/mL (ref 0.35–5.50)

## 2024-09-22 LAB — HEMOGLOBIN A1C: Hgb A1c MFr Bld: 6.4 % (ref 4.6–6.5)

## 2024-09-22 NOTE — Progress Notes (Signed)
 "  Subjective:  [name], male    DOB: 1956/04/19  Age: 68 y.o. MRN: [id number]  CC: Hypertension (6 month follow up ), Annual Exam, and Hyperlipidemia   HPI Steven Hickman presents for a CPX and f/up ---   Discussed the use of AI scribe software for clinical note transcription with the patient, who gave verbal consent to proceed.  History of Present Illness Steven Hickman is a 68 year old male who presents with shortness of breath and frequent urination.  He experiences shortness of breath primarily when bending down, but not with activities such as yard work, walking the dogs, or using a treadmill. This has been a long-standing issue. No chest pain, trouble breathing, coughing, or wheezing. He mentions needing to take it easy and go slow during activities.  He has frequent urination, needing to urinate often with small amounts each time. This is particularly bothersome during events like concerts, as he cannot go an hour without needing to use the bathroom. He also experiences nocturia.  He occasionally experiences blurred vision, although he has not had an eye examination in over a year. Previous examinations did not reveal any issues.     Outpatient Medications Prior to Visit  Medication Sig Dispense Refill   aspirin  EC 81 MG tablet Take 81 mg by mouth daily. Swallow whole.     Cholecalciferol  50 MCG (2000 UT) TABS Take 1 tablet (2,000 Units total) by mouth daily. 90 tablet 3   Fluticasone Propionate (FLONASE NA) Place into the nose.     FOLIC ACID PO Take by mouth.     Multiple Vitamin (MULTIVITAMIN) capsule Take 1 capsule by mouth daily.     OMEPRAZOLE  PO Take by mouth.     potassium chloride  (KLOR-CON ) 10 MEQ tablet TAKE 1 TABLET BY MOUTH THREE TIMES DAILY 270 tablet 0   Probiotic Product (PROBIOTIC PO) Take by mouth.     doxazosin  (CARDURA ) 2 MG tablet Take 1 tablet by mouth once daily 90 tablet 0   EDARBYCLOR  40-12.5 MG TABS Take 1 tablet by mouth daily. 90  tablet 1   pravastatin  (PRAVACHOL ) 20 MG tablet Take 1 tablet by mouth once daily 90 tablet 0   No facility-administered medications prior to visit.    ROS Review of Systems  Constitutional:  Positive for unexpected weight change (wt gain). Negative for appetite change, chills, diaphoresis, fatigue and fever.  HENT:  Positive for hearing loss. Negative for sinus pressure and sore throat.   Eyes:  Positive for visual disturbance. Negative for redness.  Respiratory:  Positive for apnea and shortness of breath. Negative for cough, chest tightness and wheezing.   Cardiovascular:  Negative for chest pain, palpitations and leg swelling.  Gastrointestinal: Negative.  Negative for abdominal pain, blood in stool, constipation, diarrhea, nausea and vomiting.  Endocrine: Positive for polyuria. Negative for polydipsia and polyphagia.  Genitourinary:  Positive for frequency. Negative for difficulty urinating, dysuria, hematuria, penile swelling and scrotal swelling.  Musculoskeletal: Negative.  Negative for arthralgias and myalgias.  Skin: Negative.   Neurological:  Negative for dizziness, weakness, light-headedness and headaches.  Hematological:  Negative for adenopathy. Does not bruise/bleed easily.  Psychiatric/Behavioral: Negative.      Objective:  BP 138/88 (BP Location: Left Arm, Patient Position: Sitting, Cuff Size: Normal)   Pulse 81   Temp 98.6 F (37 C) (Oral)   Resp 16   Ht 5' 6 (1.676 m)   Wt 218 lb 6.4 oz (99.1 kg)   SpO2  94%   BMI 35.25 kg/m   BP Readings from Last 3 Encounters:  09/22/24 138/88  03/24/24 138/72  01/24/24 (!) 140/80    Wt Readings from Last 3 Encounters:  09/22/24 218 lb 6.4 oz (99.1 kg)  05/06/24 215 lb (97.5 kg)  03/24/24 215 lb 6.4 oz (97.7 kg)    Physical Exam Vitals reviewed.  Constitutional:      Appearance: Normal appearance.  HENT:     Nose: Nose normal.     Mouth/Throat:     Mouth: Mucous membranes are moist.  Eyes:     General: No  scleral icterus.    Conjunctiva/sclera: Conjunctivae normal.  Cardiovascular:     Rate and Rhythm: Normal rate and regular rhythm.     Heart sounds: No murmur heard.    No friction rub. No gallop.     Comments: EKG- NSR with SA. 87 bpm No LVH or Q waves Unchanged  Pulmonary:     Effort: Pulmonary effort is normal.     Breath sounds: No stridor. No wheezing, rhonchi or rales.  Abdominal:     General: Abdomen is protuberant. Bowel sounds are normal. There is no distension.     Palpations: Abdomen is soft. There is no hepatomegaly, splenomegaly or mass.     Tenderness: There is no abdominal tenderness.     Hernia: No hernia is present. There is no hernia in the left inguinal area or right inguinal area.  Genitourinary:    Pubic Area: No rash.      Penis: Normal and circumcised.      Testes: Normal.     Epididymis:     Right: Normal.     Left: Normal.  Musculoskeletal:        General: Normal range of motion.     Cervical back: Neck supple.     Right lower leg: No edema.     Left lower leg: No edema.  Lymphadenopathy:     Cervical: No cervical adenopathy.     Lower Body: No right inguinal adenopathy. No left inguinal adenopathy.  Skin:    General: Skin is warm.     Findings: Lesion present.  Neurological:     General: No focal deficit present.     Mental Status: He is alert. Mental status is at baseline.  Psychiatric:        Mood and Affect: Mood normal.        Behavior: Behavior normal.     Lab Results  Component Value Date   WBC 9.6 09/22/2024   HGB 15.1 09/22/2024   HCT 45.4 09/22/2024   PLT 221.0 09/22/2024   GLUCOSE 121 (H) 09/22/2024   CHOL 156 09/22/2024   TRIG 127.0 09/22/2024   HDL 56.40 09/22/2024   LDLDIRECT 113.0 10/15/2018   LDLCALC 74 09/22/2024   ALT 35 09/22/2024   AST 24 09/22/2024   NA 136 09/22/2024   K 3.7 09/22/2024   CL 96 09/22/2024   CREATININE 0.92 09/22/2024   BUN 12 09/22/2024   CO2 28 09/22/2024   TSH 1.43 09/22/2024   PSA 1.31  09/22/2024   HGBA1C 6.4 09/22/2024    CT CARDIAC SCORING ([facility] LOCATIONS ONLY) Result Date: 08/27/2023 CLINICAL DATA:  68 year old Caucasian male with history of hyperlipidemia presenting with dyspnea. * Tracking Code: FCC * EXAM: CT CARDIAC CORONARY ARTERY CALCIUM  SCORE TECHNIQUE: Non-contrast imaging through the heart was performed using prospective ECG gating. Image post processing was performed on an independent workstation, allowing for quantitative analysis of  the heart and coronary arteries. Note that this exam targets the heart and the chest was not imaged in its entirety. COMPARISON:  No priors. FINDINGS: CORONARY CALCIUM  SCORES: Left Main: 0 LAD: 10 LCx: 0 RCA/PDA: 1 Total Agatston Score: 11 MESA database percentile: 29th AORTA MEASUREMENTS: Ascending Aorta: 3.2 cm Descending Aorta:2.7 cm OTHER FINDINGS: Within the visualized portions of the thorax there are no suspicious appearing pulmonary nodules or masses, there is no acute consolidative airspace disease, no pleural effusions, no pneumothorax and no lymphadenopathy. Visualized portions of the upper abdomen demonstrate diffuse low attenuation throughout the visualized hepatic parenchyma, indicative of a background of hepatic steatosis. There are no aggressive appearing lytic or blastic lesions noted in the visualized portions of the skeleton. IMPRESSION: 1. Patient's total coronary artery calcium  score is 11 which is 29th percentile for patient's of matched age, gender and race/ethnicity. Please note that although the presence of coronary artery calcium  documents the presence of coronary artery disease, the severity of this disease and any potential stenosis cannot be assessed on this noncontrast CT examination. Assessment for potential risk factor modification, dietary therapy or pharmacologic therapy may be warranted, if clinically indicated. 2. Hepatic steatosis. Electronically Signed   By: Toribio Aye M.D.   On: 08/27/2023 13:49    The  10-year ASCVD risk score (Arnett DK, et al., 2019) is: 14.4%   Values used to calculate the score:     Age: 8 years     Clinically relevant sex: Male     Is Non-Hispanic African American: No     Diabetic: No     Tobacco smoker: No     Systolic Blood Pressure: 138 mmHg     Is BP treated: Yes     HDL Cholesterol: 56.4 mg/dL     Total Cholesterol: 156 mg/dL   Assessment & Plan:  Dyslipidemia, goal LDL below 100- LDL goal achieved. Doing well on the statin  -     Lipid panel; Future -     TSH; Future -     Hepatic function panel; Future -     Pravastatin  Sodium; Take 1 tablet (20 mg total) by mouth daily.  Dispense: 90 tablet; Refill: 1  BPH with obstruction/lower urinary tract symptoms -     PSA; Future -     Urinalysis, Routine w reflex microscopic; Future -     Doxazosin  Mesylate; Take 1 tablet (2 mg total) by mouth daily.  Dispense: 90 tablet; Refill: 1  Essential hypertension- BP is well controlled. -     EKG 12-Lead -     Basic metabolic panel with GFR; Future -     CBC with Differential/Platelet; Future -     TSH; Future -     Edarbyclor ; Take 1 tablet by mouth daily.  Dispense: 90 tablet; Refill: 1 -     Doxazosin  Mesylate; Take 1 tablet (2 mg total) by mouth daily.  Dispense: 90 tablet; Refill: 1  Encounter for general adult medical examination with abnormal findings- Exam completed, labs reviewed, vaccines reviewed, cancer screenings addressed, pt ed material was given.   Prediabetes -     Basic metabolic panel with GFR; Future -     Hemoglobin A1c; Future  OSA (obstructive sleep apnea)  Sensorineural hearing loss (SNHL) of both ears -     Ambulatory referral to ENT  Skin lesion of face -     Ambulatory referral to Dermatology     Follow-up: Return in about 6 months (around 03/23/2025).  Debby Molt, MD "

## 2024-09-22 NOTE — Patient Instructions (Signed)
 Health Maintenance, Male  Adopting a healthy lifestyle and getting preventive care are important in promoting health and wellness. Ask your health care provider about:  The right schedule for you to have regular tests and exams.  Things you can do on your own to prevent diseases and keep yourself healthy.  What should I know about diet, weight, and exercise?  Eat a healthy diet    Eat a diet that includes plenty of vegetables, fruits, low-fat dairy products, and lean protein.  Do not eat a lot of foods that are high in solid fats, added sugars, or sodium.  Maintain a healthy weight  Body mass index (BMI) is a measurement that can be used to identify possible weight problems. It estimates body fat based on height and weight. Your health care provider can help determine your BMI and help you achieve or maintain a healthy weight.  Get regular exercise  Get regular exercise. This is one of the most important things you can do for your health. Most adults should:  Exercise for at least 150 minutes each week. The exercise should increase your heart rate and make you sweat (moderate-intensity exercise).  Do strengthening exercises at least twice a week. This is in addition to the moderate-intensity exercise.  Spend less time sitting. Even light physical activity can be beneficial.  Watch cholesterol and blood lipids  Have your blood tested for lipids and cholesterol at 66 years of age, then have this test every 5 years.  You may need to have your cholesterol levels checked more often if:  Your lipid or cholesterol levels are high.  You are older than 66 years of age.  You are at high risk for heart disease.  What should I know about cancer screening?  Many types of cancers can be detected early and may often be prevented. Depending on your health history and family history, you may need to have cancer screening at various ages. This may include screening for:  Colorectal cancer.  Prostate cancer.  Skin cancer.  Lung  cancer.  What should I know about heart disease, diabetes, and high blood pressure?  Blood pressure and heart disease  High blood pressure causes heart disease and increases the risk of stroke. This is more likely to develop in people who have high blood pressure readings or are overweight.  Talk with your health care provider about your target blood pressure readings.  Have your blood pressure checked:  Every 3-5 years if you are 24-52 years of age.  Every year if you are 3 years old or older.  If you are between the ages of 60 and 72 and are a current or former smoker, ask your health care provider if you should have a one-time screening for abdominal aortic aneurysm (AAA).  Diabetes  Have regular diabetes screenings. This checks your fasting blood sugar level. Have the screening done:  Once every three years after age 66 if you are at a normal weight and have a low risk for diabetes.  More often and at a younger age if you are overweight or have a high risk for diabetes.  What should I know about preventing infection?  Hepatitis B  If you have a higher risk for hepatitis B, you should be screened for this virus. Talk with your health care provider to find out if you are at risk for hepatitis B infection.  Hepatitis C  Blood testing is recommended for:  Everyone born from [birth year] through 1963.  Anyone  with known risk factors for hepatitis C.  Sexually transmitted infections (STIs)  You should be screened each year for STIs, including gonorrhea and chlamydia, if:  You are sexually active and are younger than 66 years of age.  You are older than 66 years of age and your health care provider tells you that you are at risk for this type of infection.  Your sexual activity has changed since you were last screened, and you are at increased risk for chlamydia or gonorrhea. Ask your health care provider if you are at risk.  Ask your health care provider about whether you are at high risk for HIV. Your health care provider  may recommend a prescription medicine to help prevent HIV infection. If you choose to take medicine to prevent HIV, you should first get tested for HIV. You should then be tested every 3 months for as long as you are taking the medicine.  Follow these instructions at home:  Alcohol use  Do not drink alcohol if your health care provider tells you not to drink.  If you drink alcohol:  Limit how much you have to 0-2 drinks a day.  Know how much alcohol is in your drink. In the [place] one drink equals one 12 oz bottle of beer (355 mL), one 5 oz glass of wine (148 mL), or one 1 oz glass of hard liquor (44 mL).  Lifestyle  Do not use any products that contain nicotine or tobacco. These products include cigarettes, chewing tobacco, and vaping devices, such as e-cigarettes. If you need help quitting, ask your health care provider.  Do not use street drugs.  Do not share needles.  Ask your health care provider for help if you need support or information about quitting drugs.  General instructions  Schedule regular health, dental, and eye exams.  Stay current with your vaccines.  Tell your health care provider if:  You often feel depressed.  You have ever been abused or do not feel safe at home.  Summary  Adopting a healthy lifestyle and getting preventive care are important in promoting health and wellness.  Follow your health care provider's instructions about healthy diet, exercising, and getting tested or screened for diseases.  Follow your health care provider's instructions on monitoring your cholesterol and blood pressure.  This information is not intended to replace advice given to you by your health care provider. Make sure you discuss any questions you have with your health care provider.  Document Revised: 02/06/2021 Document Reviewed: 02/06/2021  Elsevier Patient Education  2024 [employer].

## 2024-09-25 MED ORDER — PRAVASTATIN SODIUM 20 MG PO TABS: 20.0000 mg | ORAL_TABLET | Freq: Every day | ORAL | 1 refills | Status: AC

## 2024-09-25 MED ORDER — EDARBYCLOR 40-12.5 MG PO TABS: 1.0000 | ORAL_TABLET | Freq: Every day | ORAL | 1 refills | Status: DC

## 2024-09-25 MED ORDER — DOXAZOSIN MESYLATE 2 MG PO TABS: 2.0000 mg | ORAL_TABLET | Freq: Every day | ORAL | 1 refills | Status: AC

## 2024-09-26 ENCOUNTER — Other Ambulatory Visit: Payer: Self-pay | Admitting: Internal Medicine

## 2024-09-26 DIAGNOSIS — T502X5A Adverse effect of carbonic-anhydrase inhibitors, benzothiadiazides and other diuretics, initial encounter: Secondary | ICD-10-CM

## 2024-09-26 DIAGNOSIS — I10 Essential (primary) hypertension: Secondary | ICD-10-CM

## 2024-09-29 ENCOUNTER — Other Ambulatory Visit: Payer: Self-pay | Admitting: Internal Medicine

## 2024-09-29 DIAGNOSIS — I10 Essential (primary) hypertension: Secondary | ICD-10-CM

## 2024-10-30 ENCOUNTER — Encounter: Payer: Self-pay | Admitting: Dermatology

## 2024-10-30 ENCOUNTER — Ambulatory Visit: Admitting: Dermatology

## 2024-10-30 VITALS — BP 134/68 | HR 83

## 2024-10-30 DIAGNOSIS — D485 Neoplasm of uncertain behavior of skin: Secondary | ICD-10-CM

## 2024-10-30 DIAGNOSIS — L570 Actinic keratosis: Secondary | ICD-10-CM

## 2024-10-30 DIAGNOSIS — B079 Viral wart, unspecified: Secondary | ICD-10-CM

## 2024-10-30 NOTE — Patient Instructions (Addendum)
Cryotherapy Aftercare  Wash gently with soap and water everyday.   Apply Vaseline and Band-Aid daily until healed.  Patient Handout: Wound Care for Skin Biopsy Site  Taking Care of Your Skin Biopsy Site  Proper care of the biopsy site is essential for promoting healing and minimizing scarring. This handout provides instructions on how to care for your biopsy site to ensure optimal recovery.  1. Cleaning the Wound:  Clean the biopsy site daily with gentle soap and water. Gently pat the area dry with a clean, soft towel. Avoid harsh scrubbing or rubbing the area, as this can irritate the skin and delay healing.  2. Applying Aquaphor and Bandage:  After cleaning the wound, apply a thin layer of Aquaphor ointment to the biopsy site. Cover the area with a sterile bandage to protect it from dirt, bacteria, and friction. Change the bandage daily or as needed if it becomes soiled or wet.  3. Continued Care for One Week:  Repeat the cleaning, Aquaphor application, and bandaging process daily for one week following the biopsy procedure. Keeping the wound clean and moist during this initial healing period will help prevent infection and promote optimal healing.  4. Massaging Aquaphor into the Area:  ---After one week, discontinue the use of bandages but continue to apply Aquaphor to the biopsy site. ----Gently massage the Aquaphor into the area using circular motions. ---Massaging the skin helps to promote circulation and prevent the formation of scar tissue.   Additional Tips:  Avoid exposing the biopsy site to direct sunlight during the healing process, as this can cause hyperpigmentation or worsen scarring. If you experience any signs of infection, such as increased redness, swelling, warmth, or drainage from the wound, contact your healthcare provider immediately. Follow any additional instructions provided by your healthcare provider for caring for the biopsy site and managing any  discomfort. Conclusion:  Taking proper care of your skin biopsy site is crucial for ensuring optimal healing and minimizing scarring. By following these instructions for cleaning, applying Aquaphor, and massaging the area, you can promote a smooth and successful recovery. If you have any questions or concerns about caring for your biopsy site, don't hesitate to contact your healthcare provider for guidance.    Important Information   Due to recent changes in healthcare laws, you may see results of your pathology and/or laboratory studies on MyChart before the doctors have had a chance to review them. We understand that in some cases there may be results that are confusing or concerning to you. Please understand that not all results are received at the same time and often the doctors may need to interpret multiple results in order to provide you with the best plan of care or course of treatment. Therefore, we ask that you please give [place] 2 business days to thoroughly review all your results before contacting the office for clarification. Should we see a critical lab result, you will be contacted sooner.     If You Need Anything After Your Visit   If you have any questions or concerns for your doctor, please call our main line at [phone]. If no one answers, please leave a voicemail as directed and we will return your call as soon as possible. Messages left after 4 pm will be answered the following business day.    You may also send [place] a message via MyChart. We typically respond to MyChart messages within 1-2 business days.  For prescription refills, please ask your pharmacy to contact our  office. Our fax number is [phone].  If you have an urgent issue when the clinic is closed that cannot wait until the next business day, you can page your doctor at the number below.     Please note that while we do our best to be available for urgent issues outside of office hours, we are not available  24/7.    If you have an urgent issue and are unable to reach [place], you may choose to seek medical care at your doctor's office, retail clinic, urgent care center, or emergency room.   If you have a medical emergency, please immediately call 911 or go to the emergency department. In the event of inclement weather, please call our main line at [phone] for an update on the status of any delays or closures.  Dermatology Medication Tips: Please keep the boxes that topical medications come in in order to help keep track of the instructions about where and how to use these. Pharmacies typically print the medication instructions only on the boxes and not directly on the medication tubes.   If your medication is too expensive, please contact our office at [phone] or send [place] a message through MyChart.    We are unable to tell what your co-pay for medications will be in advance as this is different depending on your insurance coverage. However, we may be able to find a substitute medication at lower cost or fill out paperwork to get insurance to cover a needed medication.    If a prior authorization is required to get your medication covered by your insurance company, please allow [place] 1-2 business days to complete this process.   Drug prices often vary depending on where the prescription is filled and some pharmacies may offer cheaper prices.   The website www.goodrx.com contains coupons for medications through different pharmacies. The prices here do not account for what the cost may be with help from insurance (it may be cheaper with your insurance), but the website can give you the price if you did not use any insurance.  - You can print the associated coupon and take it with your prescription to the pharmacy.  - You may also stop by our office during regular business hours and pick up a GoodRx coupon card.  - If you need your prescription sent electronically to a different pharmacy, notify our  office through [facility] or by phone at [phone]

## 2024-10-30 NOTE — Progress Notes (Signed)
" ° °  New Patient Visit   History of Present Illness Steven Hickman is a 69 year old male who presents with a persistent bleeding spot on his right jawline.  He noticed a persistent bleeding spot on his right jawline while shaving. The spot initially appeared as a bump and has not resolved. It is not painful, and he is unsure how long it has been present.  He has not had any similar skin lesions removed in the past. He mentions having a bone removed from his neck many years ago but denies having any previous skin lesions frozen.  No pain associated with the spot on his jawline.  Patient states he  has spot check located at the left jaw line that he  would like to have examined. Patient reports the areas have been there for 3 months. He reports the area is not bothersome but will bleed if he tries to shave. He states that the spot has not gotten bigger but sensitive to touch. Patient reports he  has not previously been treated for these areas. Patient reports Hx of bx (Mole removed on left side of neck -Benign). Patient reports family history of skin cancer(s) (siblings).  Patient provided verbal consent for the use of an AI-assisted program to generate a detailed after-visit summary. The patient understands that the AI tool is used to support clinical documentation and that all information will be reviewed and verified by the healthcare provider.  The following portions of the chart were reviewed this encounter and updated as appropriate: medications, allergies, medical history  Review of Systems:  No other skin or systemic complaints except as noted in HPI or Assessment and Plan.  Objective  Well appearing patient in no apparent distress; mood and affect are within normal limits.  A focused examination was performed of the following areas: Left Jaw Line Left hand  Relevant exam findings are noted in the Assessment and Plan.      Right Anterior Mandible 9mm filiform verrucous  papule  Left Hand - Posterior Erythematous thin papules/macules with gritty scale.   Assessment & Plan     NEOPLASM OF UNCERTAIN BEHAVIOR OF SKIN Right Anterior Mandible - Skin / nail biopsy Type of biopsy: tangential   Informed consent: discussed and consent obtained   Timeout: patient name, date of birth, surgical site, and procedure verified   Procedure prep:  Patient was prepped and draped in usual sterile fashion Prep type:  Isopropyl alcohol Anesthesia: the lesion was anesthetized in a standard fashion   Anesthetic:  1% lidocaine w/ epinephrine 1-100,000 buffered w/ 8.4% NaHCO3 Instrument used: DermaBlade   Hemostasis achieved with: aluminum chloride   Post-procedure details: sterile dressing applied and wound care instructions given   Dressing type: bandage and pressure dressing    Specimen 1 - Surgical pathology Differential Diagnosis: R/O NMSC vs wart vs other  Check Margins: No 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  I, Jetta Ager, am acting as scribe for RUFUS CHRISTELLA HOLY, MD.   Documentation: I have reviewed the above documentation for accuracy and completeness, and I agree with the above.  RUFUS CHRISTELLA HOLY, MD     "

## 2024-11-03 ENCOUNTER — Ambulatory Visit: Payer: Self-pay | Admitting: Dermatology

## 2024-11-03 LAB — SURGICAL PATHOLOGY

## 2024-12-02 ENCOUNTER — Ambulatory Visit: Admitting: Dermatology

## 2025-02-10 ENCOUNTER — Ambulatory Visit: Admitting: Physician Assistant

## 2025-03-23 ENCOUNTER — Ambulatory Visit: Admitting: Internal Medicine
